# Patient Record
Sex: Female | Born: 1935 | Race: Black or African American | Hispanic: No | Smoking: Never smoker
Health system: Southern US, Community
[De-identification: ages and names within clinical notes are randomized; demographics above are authoritative.]

## PROBLEM LIST (undated history)

## (undated) DIAGNOSIS — I509 Heart failure, unspecified: Secondary | ICD-10-CM

## (undated) DIAGNOSIS — M1990 Unspecified osteoarthritis, unspecified site: Secondary | ICD-10-CM

## (undated) DIAGNOSIS — K5900 Constipation, unspecified: Secondary | ICD-10-CM

## (undated) DIAGNOSIS — F329 Major depressive disorder, single episode, unspecified: Secondary | ICD-10-CM

## (undated) DIAGNOSIS — E119 Type 2 diabetes mellitus without complications: Secondary | ICD-10-CM

## (undated) DIAGNOSIS — K279 Peptic ulcer, site unspecified, unspecified as acute or chronic, without hemorrhage or perforation: Secondary | ICD-10-CM

## (undated) DIAGNOSIS — D3A092 Benign carcinoid tumor of the stomach: Secondary | ICD-10-CM

## (undated) DIAGNOSIS — Z8679 Personal history of other diseases of the circulatory system: Secondary | ICD-10-CM

## (undated) DIAGNOSIS — J8410 Pulmonary fibrosis, unspecified: Secondary | ICD-10-CM

## (undated) DIAGNOSIS — R0989 Other specified symptoms and signs involving the circulatory and respiratory systems: Secondary | ICD-10-CM

## (undated) DIAGNOSIS — I10 Essential (primary) hypertension: Secondary | ICD-10-CM

## (undated) DIAGNOSIS — B029 Zoster without complications: Principal | ICD-10-CM

## (undated) DIAGNOSIS — I4891 Unspecified atrial fibrillation: Secondary | ICD-10-CM

## (undated) DIAGNOSIS — K219 Gastro-esophageal reflux disease without esophagitis: Secondary | ICD-10-CM

## (undated) DIAGNOSIS — J449 Chronic obstructive pulmonary disease, unspecified: Secondary | ICD-10-CM

## (undated) DIAGNOSIS — K5790 Diverticulosis of intestine, part unspecified, without perforation or abscess without bleeding: Secondary | ICD-10-CM

## (undated) DIAGNOSIS — M545 Low back pain: Secondary | ICD-10-CM

## (undated) DIAGNOSIS — E049 Nontoxic goiter, unspecified: Secondary | ICD-10-CM

## (undated) DIAGNOSIS — R0609 Other forms of dyspnea: Secondary | ICD-10-CM

## (undated) DIAGNOSIS — K649 Unspecified hemorrhoids: Secondary | ICD-10-CM

## (undated) HISTORY — DX: Chronic obstructive pulmonary disease, unspecified: J44.9

## (undated) HISTORY — DX: Unspecified atrial fibrillation: I48.91

## (undated) HISTORY — DX: Diverticulosis of intestine, part unspecified, without perforation or abscess without bleeding: K57.90

## (undated) HISTORY — DX: Peptic ulcer, site unspecified, unspecified as acute or chronic, without hemorrhage or perforation: K27.9

## (undated) HISTORY — DX: Other forms of dyspnea: R06.09

## (undated) HISTORY — DX: Benign carcinoid tumor of the stomach: D3A.092

## (undated) HISTORY — DX: Pulmonary fibrosis, unspecified: J84.10

## (undated) HISTORY — DX: Zoster without complications: B02.9

## (undated) HISTORY — DX: Major depressive disorder, single episode, unspecified: F32.9

## (undated) HISTORY — DX: Heart failure, unspecified: I50.9

## (undated) HISTORY — DX: Essential (primary) hypertension: I10

## (undated) HISTORY — DX: Unspecified osteoarthritis, unspecified site: M19.90

## (undated) HISTORY — DX: Constipation, unspecified: K59.00

## (undated) HISTORY — DX: Unspecified hemorrhoids: K64.9

## (undated) HISTORY — DX: Personal history of other diseases of the circulatory system: Z86.79

## (undated) HISTORY — DX: Nontoxic goiter, unspecified: E04.9

## (undated) HISTORY — DX: Low back pain: M54.5

## (undated) HISTORY — DX: Type 2 diabetes mellitus without complications: E11.9

## (undated) HISTORY — DX: Gastro-esophageal reflux disease without esophagitis: K21.9

## (undated) HISTORY — DX: Other specified symptoms and signs involving the circulatory and respiratory systems: R09.89

---

## 1983-10-31 HISTORY — PX: ABDOMINAL HYSTERECTOMY: SHX81

## 1990-09-22 LAB — HM COLONOSCOPY: HM Colonoscopy: NORMAL

## 1991-08-22 HISTORY — PX: OTHER SURGICAL HISTORY: SHX169

## 1998-04-18 ENCOUNTER — Encounter: Admission: RE | Admit: 1998-04-18 | Discharge: 1998-07-17 | Payer: Self-pay | Admitting: Internal Medicine

## 1998-07-01 ENCOUNTER — Emergency Department (HOSPITAL_COMMUNITY): Admission: EM | Admit: 1998-07-01 | Discharge: 1998-07-01 | Payer: Self-pay | Admitting: Emergency Medicine

## 1998-10-29 ENCOUNTER — Encounter: Payer: Self-pay | Admitting: Internal Medicine

## 1998-10-29 ENCOUNTER — Emergency Department (HOSPITAL_COMMUNITY): Admission: EM | Admit: 1998-10-29 | Discharge: 1998-10-29 | Payer: Self-pay | Admitting: Internal Medicine

## 1999-09-30 ENCOUNTER — Other Ambulatory Visit: Admission: RE | Admit: 1999-09-30 | Discharge: 1999-09-30 | Payer: Self-pay | Admitting: *Deleted

## 2000-04-15 ENCOUNTER — Emergency Department (HOSPITAL_COMMUNITY): Admission: EM | Admit: 2000-04-15 | Discharge: 2000-04-15 | Payer: Self-pay | Admitting: *Deleted

## 2001-08-15 ENCOUNTER — Emergency Department (HOSPITAL_COMMUNITY): Admission: EM | Admit: 2001-08-15 | Discharge: 2001-08-15 | Payer: Self-pay | Admitting: Emergency Medicine

## 2001-08-15 ENCOUNTER — Encounter: Payer: Self-pay | Admitting: Emergency Medicine

## 2002-09-16 ENCOUNTER — Encounter: Payer: Self-pay | Admitting: Emergency Medicine

## 2002-09-16 ENCOUNTER — Inpatient Hospital Stay (HOSPITAL_COMMUNITY): Admission: EM | Admit: 2002-09-16 | Discharge: 2002-09-20 | Payer: Self-pay | Admitting: Emergency Medicine

## 2002-09-21 ENCOUNTER — Encounter: Payer: Self-pay | Admitting: Emergency Medicine

## 2002-09-21 ENCOUNTER — Inpatient Hospital Stay (HOSPITAL_COMMUNITY): Admission: EM | Admit: 2002-09-21 | Discharge: 2002-10-20 | Payer: Self-pay | Admitting: Emergency Medicine

## 2002-09-21 ENCOUNTER — Encounter: Payer: Self-pay | Admitting: Pulmonary Disease

## 2002-09-22 ENCOUNTER — Encounter: Payer: Self-pay | Admitting: Pulmonary Disease

## 2002-09-23 ENCOUNTER — Encounter: Payer: Self-pay | Admitting: Internal Medicine

## 2002-09-24 ENCOUNTER — Encounter: Payer: Self-pay | Admitting: Internal Medicine

## 2002-10-01 ENCOUNTER — Encounter: Payer: Self-pay | Admitting: Pulmonary Disease

## 2002-10-02 ENCOUNTER — Encounter: Payer: Self-pay | Admitting: Pulmonary Disease

## 2002-10-03 ENCOUNTER — Encounter: Payer: Self-pay | Admitting: Pulmonary Disease

## 2002-10-09 ENCOUNTER — Encounter: Payer: Self-pay | Admitting: Pulmonary Disease

## 2002-10-14 ENCOUNTER — Encounter: Payer: Self-pay | Admitting: Internal Medicine

## 2003-05-30 ENCOUNTER — Inpatient Hospital Stay (HOSPITAL_COMMUNITY): Admission: EM | Admit: 2003-05-30 | Discharge: 2003-05-31 | Payer: Self-pay | Admitting: Emergency Medicine

## 2003-05-30 ENCOUNTER — Encounter: Payer: Self-pay | Admitting: Internal Medicine

## 2003-05-30 ENCOUNTER — Encounter: Payer: Self-pay | Admitting: Emergency Medicine

## 2004-01-18 ENCOUNTER — Emergency Department (HOSPITAL_COMMUNITY): Admission: EM | Admit: 2004-01-18 | Discharge: 2004-01-18 | Payer: Self-pay | Admitting: Emergency Medicine

## 2004-07-23 ENCOUNTER — Ambulatory Visit: Payer: Self-pay | Admitting: Internal Medicine

## 2004-11-13 ENCOUNTER — Ambulatory Visit: Payer: Self-pay | Admitting: Internal Medicine

## 2005-04-17 ENCOUNTER — Ambulatory Visit: Payer: Self-pay | Admitting: Internal Medicine

## 2005-05-22 ENCOUNTER — Ambulatory Visit: Payer: Self-pay | Admitting: Internal Medicine

## 2005-08-06 ENCOUNTER — Ambulatory Visit: Payer: Self-pay | Admitting: Internal Medicine

## 2006-04-03 ENCOUNTER — Ambulatory Visit: Payer: Self-pay | Admitting: Internal Medicine

## 2006-09-09 ENCOUNTER — Ambulatory Visit: Payer: Self-pay | Admitting: Internal Medicine

## 2007-02-25 ENCOUNTER — Ambulatory Visit: Payer: Self-pay | Admitting: Internal Medicine

## 2007-02-25 LAB — CONVERTED CEMR LAB
ALT: 23 units/L (ref 0–40)
AST: 27 units/L (ref 0–37)
Albumin: 3.8 g/dL (ref 3.5–5.2)
Alkaline Phosphatase: 66 units/L (ref 39–117)
BUN: 19 mg/dL (ref 6–23)
Bilirubin, Direct: 0.1 mg/dL (ref 0.0–0.3)
CO2: 32 meq/L (ref 19–32)
Cholesterol: 178 mg/dL (ref 0–200)
GFR calc Af Amer: 79 mL/min
Hemoglobin: 12.4 g/dL (ref 12.0–15.0)
Hgb A1c MFr Bld: 7.2 % — ABNORMAL HIGH (ref 4.6–6.0)
LDL Cholesterol: 83 mg/dL (ref 0–99)
Monocytes Relative: 2.3 % — ABNORMAL LOW (ref 3.0–11.0)
Neutro Abs: 6.9 10*3/uL (ref 1.4–7.7)
Neutrophils Relative %: 78.9 % — ABNORMAL HIGH (ref 43.0–77.0)
Potassium: 3.8 meq/L (ref 3.5–5.1)
RBC: 4.04 M/uL (ref 3.87–5.11)
RDW: 13.2 % (ref 11.5–14.6)
Sodium: 144 meq/L (ref 135–145)
TSH: 0.28 microintl units/mL — ABNORMAL LOW (ref 0.35–5.50)
Total Bilirubin: 0.7 mg/dL (ref 0.3–1.2)
Total CHOL/HDL Ratio: 2.2
Total Protein: 7.6 g/dL (ref 6.0–8.3)
Triglycerides: 72 mg/dL (ref 0–149)

## 2007-03-10 ENCOUNTER — Ambulatory Visit: Payer: Self-pay | Admitting: Internal Medicine

## 2007-03-19 ENCOUNTER — Ambulatory Visit: Payer: Self-pay

## 2007-03-19 ENCOUNTER — Encounter: Payer: Self-pay | Admitting: Internal Medicine

## 2007-06-02 ENCOUNTER — Ambulatory Visit: Payer: Self-pay | Admitting: Internal Medicine

## 2007-06-02 LAB — CONVERTED CEMR LAB
BUN: 15 mg/dL (ref 6–23)
CO2: 35 meq/L — ABNORMAL HIGH (ref 19–32)
Calcium: 8.9 mg/dL (ref 8.4–10.5)
GFR calc non Af Amer: 66 mL/min
HDL: 73.4 mg/dL (ref >39.0)
Sodium: 146 meq/L — ABNORMAL HIGH (ref 135–145)
VLDL: 28 mg/dL (ref 0–40)

## 2007-06-03 ENCOUNTER — Encounter: Payer: Self-pay | Admitting: Internal Medicine

## 2007-06-03 DIAGNOSIS — E119 Type 2 diabetes mellitus without complications: Secondary | ICD-10-CM

## 2007-06-03 DIAGNOSIS — J449 Chronic obstructive pulmonary disease, unspecified: Secondary | ICD-10-CM

## 2007-06-03 DIAGNOSIS — Z8679 Personal history of other diseases of the circulatory system: Secondary | ICD-10-CM

## 2007-06-03 DIAGNOSIS — M1990 Unspecified osteoarthritis, unspecified site: Secondary | ICD-10-CM

## 2007-06-03 DIAGNOSIS — I10 Essential (primary) hypertension: Secondary | ICD-10-CM | POA: Insufficient documentation

## 2007-06-03 DIAGNOSIS — K219 Gastro-esophageal reflux disease without esophagitis: Secondary | ICD-10-CM

## 2007-06-03 DIAGNOSIS — F329 Major depressive disorder, single episode, unspecified: Secondary | ICD-10-CM

## 2007-06-03 DIAGNOSIS — K279 Peptic ulcer, site unspecified, unspecified as acute or chronic, without hemorrhage or perforation: Secondary | ICD-10-CM

## 2007-06-03 DIAGNOSIS — M545 Low back pain, unspecified: Secondary | ICD-10-CM

## 2007-06-03 DIAGNOSIS — I509 Heart failure, unspecified: Secondary | ICD-10-CM

## 2007-06-03 DIAGNOSIS — F3289 Other specified depressive episodes: Secondary | ICD-10-CM

## 2007-06-03 DIAGNOSIS — I5022 Chronic systolic (congestive) heart failure: Secondary | ICD-10-CM | POA: Insufficient documentation

## 2007-06-03 DIAGNOSIS — J4489 Other specified chronic obstructive pulmonary disease: Secondary | ICD-10-CM

## 2007-06-03 HISTORY — DX: Unspecified osteoarthritis, unspecified site: M19.90

## 2007-06-03 HISTORY — DX: Major depressive disorder, single episode, unspecified: F32.9

## 2007-06-03 HISTORY — DX: Chronic obstructive pulmonary disease, unspecified: J44.9

## 2007-06-03 HISTORY — DX: Gastro-esophageal reflux disease without esophagitis: K21.9

## 2007-06-03 HISTORY — DX: Low back pain, unspecified: M54.50

## 2007-06-03 HISTORY — DX: Heart failure, unspecified: I50.9

## 2007-06-03 HISTORY — DX: Type 2 diabetes mellitus without complications: E11.9

## 2007-06-03 HISTORY — DX: Essential (primary) hypertension: I10

## 2007-06-03 HISTORY — DX: Peptic ulcer, site unspecified, unspecified as acute or chronic, without hemorrhage or perforation: K27.9

## 2007-06-03 HISTORY — DX: Other specified depressive episodes: F32.89

## 2007-06-03 HISTORY — DX: Personal history of other diseases of the circulatory system: Z86.79

## 2007-06-03 HISTORY — DX: Other specified chronic obstructive pulmonary disease: J44.89

## 2007-08-10 DIAGNOSIS — I4891 Unspecified atrial fibrillation: Secondary | ICD-10-CM | POA: Insufficient documentation

## 2007-08-10 DIAGNOSIS — D3A092 Benign carcinoid tumor of the stomach: Secondary | ICD-10-CM

## 2007-08-10 DIAGNOSIS — J8410 Pulmonary fibrosis, unspecified: Secondary | ICD-10-CM

## 2007-08-10 DIAGNOSIS — J309 Allergic rhinitis, unspecified: Secondary | ICD-10-CM | POA: Insufficient documentation

## 2007-08-10 HISTORY — DX: Pulmonary fibrosis, unspecified: J84.10

## 2007-08-10 HISTORY — DX: Unspecified atrial fibrillation: I48.91

## 2007-08-10 HISTORY — DX: Benign carcinoid tumor of the stomach: D3A.092

## 2007-08-12 ENCOUNTER — Encounter: Payer: Self-pay | Admitting: Internal Medicine

## 2007-09-03 ENCOUNTER — Ambulatory Visit: Payer: Self-pay | Admitting: Internal Medicine

## 2007-09-09 LAB — CONVERTED CEMR LAB
BUN: 17 mg/dL (ref 6–23)
Chloride: 100 meq/L (ref 96–112)
Glucose, Bld: 119 mg/dL — ABNORMAL HIGH (ref 70–99)
Hgb A1c MFr Bld: 7 % — ABNORMAL HIGH (ref 4.6–6.0)
LDL Cholesterol: 64 mg/dL (ref 0–99)
Sodium: 147 meq/L — ABNORMAL HIGH (ref 135–145)
Triglycerides: 122 mg/dL (ref 0–149)

## 2007-09-10 ENCOUNTER — Telehealth (INDEPENDENT_AMBULATORY_CARE_PROVIDER_SITE_OTHER): Payer: Self-pay | Admitting: *Deleted

## 2007-11-30 ENCOUNTER — Ambulatory Visit: Payer: Self-pay | Admitting: Internal Medicine

## 2007-11-30 LAB — CONVERTED CEMR LAB
Alkaline Phosphatase: 65 units/L (ref 39–117)
Bilirubin, Direct: 0.1 mg/dL (ref 0.0–0.3)
CO2: 33 meq/L — ABNORMAL HIGH (ref 19–32)
Calcium: 9.2 mg/dL (ref 8.4–10.5)
Chloride: 102 meq/L (ref 96–112)
Cholesterol: 178 mg/dL (ref 0–200)
GFR calc Af Amer: 63 mL/min
GFR calc non Af Amer: 52 mL/min
Glucose, Bld: 143 mg/dL — ABNORMAL HIGH (ref 70–99)
LDL Cholesterol: 79 mg/dL (ref 0–99)
Potassium: 4.2 meq/L (ref 3.5–5.1)
Total Bilirubin: 0.6 mg/dL (ref 0.3–1.2)
Total Protein: 7.3 g/dL (ref 6.0–8.3)
Triglycerides: 71 mg/dL (ref 0–149)

## 2007-12-27 ENCOUNTER — Encounter: Payer: Self-pay | Admitting: Internal Medicine

## 2008-03-17 ENCOUNTER — Ambulatory Visit: Payer: Self-pay | Admitting: Internal Medicine

## 2008-03-17 DIAGNOSIS — M1710 Unilateral primary osteoarthritis, unspecified knee: Secondary | ICD-10-CM

## 2008-04-20 ENCOUNTER — Ambulatory Visit: Payer: Self-pay | Admitting: Internal Medicine

## 2008-04-20 DIAGNOSIS — R1084 Generalized abdominal pain: Secondary | ICD-10-CM

## 2008-04-24 LAB — CONVERTED CEMR LAB
Basophils Absolute: 0 10*3/uL (ref 0.0–0.1)
Bilirubin Urine: NEGATIVE
Calcium: 9.1 mg/dL (ref 8.4–10.5)
Chloride: 99 meq/L (ref 96–112)
Eosinophils Absolute: 0 10*3/uL (ref 0.0–0.7)
Hemoglobin: 14.2 g/dL (ref 12.0–15.0)
Lipase: 23 units/L (ref 11.0–59.0)
MCHC: 33.7 g/dL (ref 30.0–36.0)
Neutro Abs: 7.4 10*3/uL (ref 1.4–7.7)
Neutrophils Relative %: 79.3 % — ABNORMAL HIGH (ref 43.0–77.0)
Nitrite: NEGATIVE
RBC: 4.76 M/uL (ref 3.87–5.11)
Specific Gravity, Urine: 1.02 (ref 1.000–1.03)
TSH: 0.44 microintl units/mL (ref 0.35–5.50)
Urine Glucose: NEGATIVE mg/dL
WBC: 9.2 10*3/uL (ref 4.5–10.5)
pH: 5.5 (ref 5.0–8.0)

## 2008-04-26 ENCOUNTER — Telehealth (INDEPENDENT_AMBULATORY_CARE_PROVIDER_SITE_OTHER): Payer: Self-pay | Admitting: *Deleted

## 2008-06-09 ENCOUNTER — Ambulatory Visit: Payer: Self-pay | Admitting: Internal Medicine

## 2008-06-09 DIAGNOSIS — R0989 Other specified symptoms and signs involving the circulatory and respiratory systems: Secondary | ICD-10-CM

## 2008-06-09 DIAGNOSIS — R0609 Other forms of dyspnea: Secondary | ICD-10-CM

## 2008-06-09 DIAGNOSIS — R609 Edema, unspecified: Secondary | ICD-10-CM

## 2008-06-09 HISTORY — DX: Other forms of dyspnea: R06.09

## 2008-06-09 HISTORY — DX: Other specified symptoms and signs involving the circulatory and respiratory systems: R09.89

## 2008-07-26 ENCOUNTER — Ambulatory Visit: Payer: Self-pay | Admitting: Internal Medicine

## 2008-08-01 ENCOUNTER — Telehealth: Payer: Self-pay | Admitting: Internal Medicine

## 2008-08-28 ENCOUNTER — Telehealth: Payer: Self-pay | Admitting: Internal Medicine

## 2008-10-10 ENCOUNTER — Telehealth (INDEPENDENT_AMBULATORY_CARE_PROVIDER_SITE_OTHER): Payer: Self-pay | Admitting: *Deleted

## 2009-02-02 ENCOUNTER — Ambulatory Visit: Payer: Self-pay | Admitting: Internal Medicine

## 2009-02-02 DIAGNOSIS — R5383 Other fatigue: Secondary | ICD-10-CM

## 2009-02-02 DIAGNOSIS — R5381 Other malaise: Secondary | ICD-10-CM | POA: Insufficient documentation

## 2009-02-03 LAB — CONVERTED CEMR LAB
AST: 27 units/L (ref 0–37)
Alkaline Phosphatase: 62 units/L (ref 39–117)
BUN: 20 mg/dL (ref 6–23)
Calcium: 9.7 mg/dL (ref 8.4–10.5)
Chloride: 104 meq/L (ref 96–112)
Creatinine, Ser: 1.1 mg/dL (ref 0.4–1.2)
Creatinine,U: 75.6 mg/dL
Folate: >20 ng/mL
GFR calc non Af Amer: 62.61 mL/min (ref >60)
Glucose, Bld: 124 mg/dL — ABNORMAL HIGH (ref 70–99)
HDL: 92 mg/dL (ref >39.00)
Hemoglobin: 12.8 g/dL (ref 12.0–15.0)
Lymphocytes Relative: 15 % (ref 12.0–46.0)
Lymphs Abs: 1.6 10*3/uL (ref 0.7–4.0)
MCHC: 33.7 g/dL (ref 30.0–36.0)
Microalb, Ur: 0.8 mg/dL (ref 0.0–1.9)
Monocytes Absolute: 0.2 10*3/uL (ref 0.1–1.0)
Neutro Abs: 8.7 10*3/uL — ABNORMAL HIGH (ref 1.4–7.7)
Neutrophils Relative %: 81.8 % — ABNORMAL HIGH (ref 43.0–77.0)
Platelets: 221 10*3/uL (ref 150.0–400.0)
RBC: 4.28 M/uL (ref 3.87–5.11)
RDW: 13.6 % (ref 11.5–14.6)
Total CHOL/HDL Ratio: 2
Triglycerides: 101 mg/dL (ref 0.0–149.0)
VLDL: 20.2 mg/dL (ref 0.0–40.0)
Vitamin B-12: 299 pg/mL (ref 211–911)
WBC: 10.6 10*3/uL — ABNORMAL HIGH (ref 4.5–10.5)

## 2009-04-17 ENCOUNTER — Telehealth (INDEPENDENT_AMBULATORY_CARE_PROVIDER_SITE_OTHER): Payer: Self-pay | Admitting: *Deleted

## 2009-05-11 ENCOUNTER — Ambulatory Visit: Payer: Self-pay | Admitting: Internal Medicine

## 2009-05-14 ENCOUNTER — Ambulatory Visit: Payer: Self-pay | Admitting: Internal Medicine

## 2009-05-14 LAB — CONVERTED CEMR LAB
ALT: 17 units/L (ref 0–35)
AST: 25 units/L (ref 0–37)
Albumin: 3.8 g/dL (ref 3.5–5.2)
BUN: 19 mg/dL (ref 6–23)
Basophils Absolute: 0 10*3/uL (ref 0.0–0.1)
Chloride: 104 meq/L (ref 96–112)
Creatinine, Ser: 1.1 mg/dL (ref 0.4–1.2)
Eosinophils Absolute: 0.2 10*3/uL (ref 0.0–0.7)
GFR calc non Af Amer: 62.56 mL/min (ref >60)
HCT: 40 % (ref 36.0–46.0)
Hemoglobin: 13.2 g/dL (ref 12.0–15.0)
Lymphs Abs: 2.6 10*3/uL (ref 0.7–4.0)
MCHC: 33 g/dL (ref 30.0–36.0)
Monocytes Relative: 3.7 % (ref 3.0–12.0)
Platelets: 233 10*3/uL (ref 150.0–400.0)
Potassium: 3.2 meq/L — ABNORMAL LOW (ref 3.5–5.1)
Pro B Natriuretic peptide (BNP): 39 pg/mL (ref 0.0–100.0)
Sed Rate: 63 mm/hr — ABNORMAL HIGH (ref 0–22)
Sodium: 148 meq/L — ABNORMAL HIGH (ref 135–145)
Total Bilirubin: 0.8 mg/dL (ref 0.3–1.2)
WBC: 11.2 10*3/uL — ABNORMAL HIGH (ref 4.5–10.5)

## 2009-06-13 ENCOUNTER — Ambulatory Visit: Payer: Self-pay | Admitting: Internal Medicine

## 2009-07-11 ENCOUNTER — Telehealth: Payer: Self-pay | Admitting: Internal Medicine

## 2009-07-11 ENCOUNTER — Ambulatory Visit: Payer: Self-pay | Admitting: Internal Medicine

## 2009-08-08 ENCOUNTER — Ambulatory Visit: Payer: Self-pay | Admitting: Internal Medicine

## 2009-08-08 DIAGNOSIS — K649 Unspecified hemorrhoids: Secondary | ICD-10-CM | POA: Insufficient documentation

## 2009-08-08 HISTORY — DX: Unspecified hemorrhoids: K64.9

## 2009-08-27 ENCOUNTER — Telehealth (INDEPENDENT_AMBULATORY_CARE_PROVIDER_SITE_OTHER): Payer: Self-pay | Admitting: *Deleted

## 2009-09-17 ENCOUNTER — Telehealth: Payer: Self-pay | Admitting: Internal Medicine

## 2009-10-11 ENCOUNTER — Ambulatory Visit: Payer: Self-pay | Admitting: Internal Medicine

## 2009-12-26 ENCOUNTER — Encounter: Payer: Self-pay | Admitting: Internal Medicine

## 2009-12-27 ENCOUNTER — Encounter: Payer: Self-pay | Admitting: Internal Medicine

## 2009-12-27 ENCOUNTER — Telehealth: Payer: Self-pay | Admitting: Internal Medicine

## 2010-02-15 ENCOUNTER — Ambulatory Visit: Payer: Self-pay | Admitting: Internal Medicine

## 2010-02-15 DIAGNOSIS — K5900 Constipation, unspecified: Secondary | ICD-10-CM

## 2010-02-15 HISTORY — DX: Constipation, unspecified: K59.00

## 2010-02-16 LAB — CONVERTED CEMR LAB: Vit D, 25-Hydroxy: 45 ng/mL (ref 30–89)

## 2010-02-19 ENCOUNTER — Telehealth: Payer: Self-pay | Admitting: Internal Medicine

## 2010-02-19 ENCOUNTER — Encounter (INDEPENDENT_AMBULATORY_CARE_PROVIDER_SITE_OTHER): Payer: Self-pay | Admitting: *Deleted

## 2010-02-19 LAB — CONVERTED CEMR LAB
AST: 29 units/L (ref 0–37)
Alkaline Phosphatase: 59 units/L (ref 39–117)
BUN: 20 mg/dL (ref 6–23)
Basophils Absolute: 0 10*3/uL (ref 0.0–0.1)
Basophils Relative: 0.2 % (ref 0.0–3.0)
Chloride: 104 meq/L (ref 96–112)
Cholesterol: 194 mg/dL (ref 0–200)
Creatinine, Ser: 1.1 mg/dL (ref 0.4–1.2)
Eosinophils Absolute: 0.1 10*3/uL (ref 0.0–0.7)
GFR calc non Af Amer: 61.15 mL/min (ref >60)
HCT: 37 % (ref 36.0–46.0)
Hgb A1c MFr Bld: 6.8 % — ABNORMAL HIGH (ref 4.6–6.5)
LDL Cholesterol: 69 mg/dL (ref 0–99)
Lymphs Abs: 2 10*3/uL (ref 0.7–4.0)
MCHC: 33.8 g/dL (ref 30.0–36.0)
Monocytes Absolute: 0.1 10*3/uL (ref 0.1–1.0)
Neutro Abs: 8.5 10*3/uL — ABNORMAL HIGH (ref 1.4–7.7)
Neutrophils Relative %: 79.2 % — ABNORMAL HIGH (ref 43.0–77.0)
Platelets: 231 10*3/uL (ref 150.0–400.0)
Saturation Ratios: 19 % — ABNORMAL LOW (ref 20.0–50.0)
Triglycerides: 166 mg/dL — ABNORMAL HIGH (ref 0.0–149.0)
VLDL: 33.2 mg/dL (ref 0.0–40.0)

## 2010-02-20 ENCOUNTER — Ambulatory Visit: Payer: Self-pay | Admitting: Internal Medicine

## 2010-02-20 LAB — CONVERTED CEMR LAB
Creatinine,U: 69.6 mg/dL
Ketones, ur: NEGATIVE mg/dL
Nitrite: NEGATIVE
Specific Gravity, Urine: 1.015 (ref 1.000–1.030)
Urobilinogen, UA: 0.2 (ref 0.0–1.0)

## 2010-02-26 ENCOUNTER — Ambulatory Visit: Payer: Self-pay | Admitting: Internal Medicine

## 2010-03-28 ENCOUNTER — Ambulatory Visit: Payer: Self-pay | Admitting: Cardiovascular Disease

## 2010-03-28 ENCOUNTER — Inpatient Hospital Stay (HOSPITAL_COMMUNITY): Admission: EM | Admit: 2010-03-28 | Discharge: 2010-03-30 | Payer: Self-pay | Admitting: Emergency Medicine

## 2010-03-29 ENCOUNTER — Encounter (INDEPENDENT_AMBULATORY_CARE_PROVIDER_SITE_OTHER): Payer: Self-pay | Admitting: Internal Medicine

## 2010-04-08 ENCOUNTER — Ambulatory Visit: Payer: Self-pay | Admitting: Internal Medicine

## 2010-04-08 ENCOUNTER — Telehealth: Payer: Self-pay | Admitting: Internal Medicine

## 2010-04-08 DIAGNOSIS — R300 Dysuria: Secondary | ICD-10-CM | POA: Insufficient documentation

## 2010-04-08 DIAGNOSIS — R42 Dizziness and giddiness: Secondary | ICD-10-CM | POA: Insufficient documentation

## 2010-04-08 LAB — CONVERTED CEMR LAB
Leukocytes, UA: NEGATIVE
Nitrite: NEGATIVE
Total Protein, Urine: NEGATIVE mg/dL
Urine Glucose: NEGATIVE mg/dL

## 2010-04-15 ENCOUNTER — Ambulatory Visit: Payer: Self-pay | Admitting: Internal Medicine

## 2010-04-15 ENCOUNTER — Telehealth: Payer: Self-pay | Admitting: Internal Medicine

## 2010-04-15 DIAGNOSIS — N390 Urinary tract infection, site not specified: Secondary | ICD-10-CM | POA: Insufficient documentation

## 2010-04-16 ENCOUNTER — Encounter: Payer: Self-pay | Admitting: Internal Medicine

## 2010-04-19 ENCOUNTER — Ambulatory Visit: Payer: Self-pay | Admitting: Internal Medicine

## 2010-04-22 ENCOUNTER — Encounter: Payer: Self-pay | Admitting: Internal Medicine

## 2010-04-23 ENCOUNTER — Encounter: Payer: Self-pay | Admitting: Internal Medicine

## 2010-05-06 ENCOUNTER — Ambulatory Visit: Payer: Self-pay | Admitting: Internal Medicine

## 2010-05-09 ENCOUNTER — Telehealth: Payer: Self-pay | Admitting: Internal Medicine

## 2010-05-10 ENCOUNTER — Telehealth: Payer: Self-pay | Admitting: Internal Medicine

## 2010-05-28 ENCOUNTER — Encounter: Payer: Self-pay | Admitting: Internal Medicine

## 2010-05-30 ENCOUNTER — Ambulatory Visit: Payer: Self-pay | Admitting: Internal Medicine

## 2010-05-31 ENCOUNTER — Encounter: Payer: Self-pay | Admitting: Internal Medicine

## 2010-08-07 ENCOUNTER — Ambulatory Visit: Payer: Self-pay | Admitting: Internal Medicine

## 2010-08-07 DIAGNOSIS — R269 Unspecified abnormalities of gait and mobility: Secondary | ICD-10-CM | POA: Insufficient documentation

## 2010-08-13 ENCOUNTER — Telehealth: Payer: Self-pay | Admitting: Internal Medicine

## 2010-08-16 ENCOUNTER — Emergency Department (HOSPITAL_COMMUNITY): Admission: EM | Admit: 2010-08-16 | Discharge: 2010-08-16 | Payer: Self-pay | Admitting: Emergency Medicine

## 2010-10-22 NOTE — Miscellaneous (Signed)
Summary: MS Order / [facility]  MS Order / [facility]   Imported By: Lennie Odor 05/31/2010 13:58:06  _____________________________________________________________________  External Attachment:    Type:   Image     Comment:   External Document

## 2010-10-22 NOTE — Medication Information (Signed)
Summary: Diabetic Shoes / [facility]  Diabetic Shoes / [facility]   Imported By: Lennie Odor 01/03/2010 11:58:21  _____________________________________________________________________  External Attachment:    Type:   Image     Comment:   External Document

## 2010-10-22 NOTE — Miscellaneous (Signed)
Summary: Updated encounter/[facility]  Updated encounter/[facility]   Imported By: Lester Knowles 05/08/2010 07:56:58  _____________________________________________________________________  External Attachment:    Type:   Image     Comment:   External Document

## 2010-10-22 NOTE — Assessment & Plan Note (Signed)
Summary: Pulmonary/  summary f/u  ov rec trial off spriva   Copy to:  Dr. Oliver Barre Primary Provider/Referring Provider:  Dr. Oliver Barre  CC:  Followup with PFT's.  Pt states that she feels that her breathing has improved since last ov. She denies any new complaints today.Marland Kitchen  History of Present Illness: 60 yowbf never smoker admitted [facility] 08/2002 with atypical pna vs ILD and treated with steroids ever since then for PF/ chronic respiratory failure assoc with elevated esr ? underlying connective tissue dz.  July 11, 2009 most time on 2.5 lpm with h/o pf acutely worse Sept 22 to point where could only walk 10 ft and started on spiriva and better at time of initial ov.    October 11, 2009 Followup with PFT's.  Pt states that she feels that her breathing has improved since last ov when changed from coreg to bystolic based on perceived response to spiriva.  She denies any new complaints today.  Pt denies any significant sore throat, dysphagia, itching, sneezing,  nasal congestion or excess secretions,  fever, chills, sweats, unintended wt loss, pleuritic or exertional cp, hempoptysis, change in poor activity tolerance  orthopnea pnd or leg swelling. Pt also denies any obvious fluctuation in symptoms with weather or environmental change or other alleviating or aggravating factors.         Current Medications (verified): 1)  Unilet Excelite Ii   Misc (Lancets) .... Use As Directed 2)  Amlodipine Besylate 10 Mg Tabs (Amlodipine Besylate) .... Take 1 Tablet By Mouth Once A Day 3)  Fexofenadine Hcl 180 Mg Tabs (Fexofenadine Hcl) .... Take 1 Tablet By Mouth Once A Day 4)  Furosemide 20 Mg Tabs (Furosemide) .... 3 By Mouth Two Times A Day 5)  Lotrisone 1-0.05 % Crea (Clotrimazole-Betamethasone) .... Apply To Skin Twice A Day 6)  Lovastatin 40 Mg Tabs (Lovastatin) .... Take 1 Tablet By Mouth Once A Day 7)  Metformin Hcl 500 Mg Tb24 (Metformin Hcl) .... 4 By Mouth Once Daily 8)  Klor-Con 20 Meq Pack  (Potassium Chloride) .... 3 By Mouth Once Daily 9)  Omeprazole 20 Mg  Cpdr (Omeprazole) .Marland Kitchen.. 1 By Mouth Once Daily 10)  Prednisone 10 Mg  Tabs (Prednisone) .Marland Kitchen.. 1 By Mouth Once Daily 11)  Ecotrin Low Strength 81 Mg  Tbec (Aspirin) .Marland Kitchen.. 1 By Mouth Qd 12)  Gnp Truetrack Smart System   Strp (Glucose Blood) .... Check Cbg 4 Times A Day and Prn 13)  Hydrocodone-Acetaminophen 5-325 Mg Tabs (Hydrocodone-Acetaminophen) .Marland Kitchen.. 1po Q 6 Hrs As Needed Pain 14)  Tessalon Perles 100 Mg Caps (Benzonatate) .Marland Kitchen.. 1 - 2po Three Times A Day As Needed 15)  Multi Vitamin .Marland Kitchen.. 1 By Mouth Once Daily 16)  Spiriva Handihaler 18 Mcg Caps (Tiotropium Bromide Monohydrate) .Marland Kitchen.. 1 Puff Once Daily 17)  O2 2 Lpm .... 24/7 18)  Bystolic 5 Mg Tabs (Nebivolol Hcl) .... One Daily 19)  Anusol-Hc 25 Mg Supp (Hydrocortisone Acetate) .Marland Kitchen.. 1 Pr Two Times A Day For 10 Days or Until Healed  Allergies (verified): No Known Drug Allergies  Past History:  Past Medical History: GERD Hypertension Osteoarthritis Peptic ulcer disease Transient ischemic attack, hx of Congestive heart failure     - 2d echo 03/19/07 mild diastolic dysfunction, nl LA, RV Diabetes mellitus, type II Low back pain COPD Depression gastric carcinoid  tumor Allergic rhinitis Atrial fibrillation Interstitial lung disease    - Onset 12/03 with definite steroid responsive component    - PFT's October 11, 2009 : VC 67%, no airflow obstruction, DLC0 14%  Vital Signs:  Patient profile:   77 year old female Weight:      233 pounds O2 Sat:      99 % on 2.5 liters pulsed Temp:     98.2 degrees F oral Pulse rate:   82 / minute BP sitting:   130 / 68  (left arm) Cuff size:   large  Vitals Entered By: Vernie Murders (October 11, 2009 11:31 AM)  O2 Flow:  2.5 liters pulsed  Physical Exam  Additional Exam:  amb bf on 02 wt 240 > 233 October 11, 2009  HEENT: nl dentition, turbinates, and orophanx. Nl external ear canals without cough reflex NECK :  without  JVD/Nodes/TM/ nl carotid upstrokes bilaterally LUNGS: no acc muscle use, clear to A and P bilaterally without cough on insp or exp maneuvers CV:  RRR  no s3 or murmur or increase in P2,  1-2 plus pitting bilateral edema   ABD:  soft and nontender with nl excursion in the supine position. No bruits or organomegaly, bowel sounds nl MS:  warm without deformities, calf tenderness, cyanosis or clubbing       Impression & Recommendations:  Problem # 1:  INTERSTITIAL LUNG DISEASE (ICD-515) Purely restrictive pft's but response to spiriva while on non-specific beta blockers.  Probably doesn't need spriva now that off coreg.   Remains 02 dependent/ severe PF though not rapidly progressive on present rx.  Problem # 2:  HYPERTENSION (ICD-401.9)  Her updated medication list for this problem includes:    Amlodipine Besylate 10 Mg Tabs (Amlodipine besylate) .Marland Kitchen... Take 1 tablet by mouth once a day    Furosemide 20 Mg Tabs (Furosemide) .Marland KitchenMarland KitchenMarland KitchenMarland Kitchen 3 by mouth two times a day    Bystolic 5 Mg Tabs (Nebivolol hcl) ..... One daily  Orders: Est. Patient Level III (84696)  Patient Instructions: 1)  Continue bystolic instead of coreg 2)  Try off spiriva to see if it makes any difference to your breathing and if breathing worse ok to resume it 3)  Return to office in 3 months, sooner if needed  Prescriptions: BYSTOLIC 5 MG TABS (NEBIVOLOL HCL) one daily  #30 x 11   Entered and Authorized by:   Nyoka Cowden MD   Signed by:   Nyoka Cowden MD on 10/11/2009   Method used:   Electronically to        [facility] (retail)       [address]       [place], [place]  [id number]       Ph: [phone]       Fax: [phone]   RxID:   [id number]

## 2010-10-22 NOTE — Assessment & Plan Note (Signed)
Summary: 3 mos f/u appt/#/cd   Vital Signs:  Patient profile:   77 year old female Height:      65 inches Weight:      233.38 pounds BMI:     38.98 O2 Sat:      97 % on Room air Temp:     98.8 degrees F oral Pulse rate:   81 / minute BP sitting:   130 / 80  (left arm) Cuff size:   large  Vitals Entered By: Zella Ball Ewing CMA (AAMA) (August 07, 2010 11:14 AM)  O2 Flow:  Room air CC: 3 month followup/RE   Primary Care Provider:  Dr. Oliver Barre  CC:  3 month followup/RE.  History of Present Illness: here to f/u - sitting in wheelchair today, but very difficult to walk now due to pain and breathing;  pain meds help and spiriva definitely helps;  felt some warm today for some reason but no fever or chills, no ST or increased cough and wheezing  stable per pt;  can still walk room to room at home;  no CP  but has signficiant left knee pain , no recent falls  - has walker at home;  getting overall weaker she thinks and may need PT for home to re-start;  Pt denies CP, worsening sob, doe, wheezing, orthopnea, pnd, worsening LE edema, palps, dizziness or syncope  Pt denies new neuro symptoms such as headache, facial or extremity weakness Pt denies polydipsia, polyuria, or low sugar symptoms such as shakiness improved with eating.  Overall good compliance with meds, trying to follow low chol, DM diet, wt stable, little excercise however Overall good compliance with meds, and good tolerability. No fever, wt loss, night sweats, loss of appetite or other constitutional symptoms Denies worsening depressive symptoms, suicidal ideation, or panic.    Problems Prior to Update: 1)  Gait Disturbance  (ICD-781.2) 2)  Uti  (ICD-599.0) 3)  Dizziness  (ICD-780.4) 4)  Dysuria  (ICD-788.1) 5)  Preventive Health Care  (ICD-V70.0) 6)  Constipation, Intermittent  (ICD-564.00) 7)  Fatigue  (ICD-780.79) 8)  Hemorrhoids  (ICD-455.6) 9)  Edema  (ICD-782.3) 10)  Fatigue  (ICD-780.79) 11)  Osteoarthritis, Knee,  Right  (ICD-715.96) 12)  Peripheral Edema  (ICD-782.3) 13)  Dyspnea/shortness of Breath  (ICD-786.09) 14)  Abdominal Pain, Generalized  (ICD-789.07) 15)  Osteoarthritis, Knee, Left  (ICD-715.96) 16)  Hepatotoxicity, Drug-induced, Risk of  (ICD-V58.69) 17)  Interstitial Lung Disease  (ICD-515) 18)  Atrial Fibrillation  (ICD-427.31) 19)  Allergic Rhinitis  (ICD-477.9) 20)  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 Use As Directed 2)  Fexofenadine Hcl 180 Mg Tabs (Fexofenadine Hcl) .... Take 1 Tablet By Mouth Once A Day 3)  Furosemide 20 Mg Tabs (Furosemide) .... 3 By Mouth Two Times A Day 4)  Lotrisone 1-0.05 % Crea (Clotrimazole-Betamethasone) .... Apply To Skin Twice A Day 5)  Lovastatin 40 Mg Tabs (Lovastatin) .... Take 1 Tablet By Mouth Once A Day 6)  Metformin Hcl 500 Mg Tb24 (Metformin Hcl) .... 4 By  Mouth Once Daily 7)  Klor-Con 20 Meq Pack (Potassium Chloride) .... 3 By Mouth Once Daily 8)  Omeprazole 20 Mg  Cpdr (Omeprazole) .Marland Kitchen.. 1 By Mouth Once Daily 9)  Ecotrin Low Strength 81 Mg  Tbec (Aspirin) .Marland Kitchen.. 1 By Mouth Qd 10)  Gnp Truetrack Smart System   Strp (Glucose Blood) .... Check Cbg 4 Times A Day and Prn 11)  Multi Vitamin .Marland Kitchen.. 1 By Mouth Once Daily 12)  O2 2 Lpm .... 24/7 13)  Bystolic 10 Mg Tabs (Nebivolol Hcl) .Marland Kitchen.. 1po Once Daily 14)  Meclizine Hcl 12.5 Mg Tabs (Meclizine Hcl) .Marland Kitchen.. 1po Q 6 Hrs As Needed Dizziness 15)  Nitrofurantoin Macrocrystal 100 Mg Caps (Nitrofurantoin Macrocrystal) .Marland Kitchen.. 1 By Mouth Two Times A Day 16)  Prednisone 10 Mg  Tabs (Prednisone) .Marland Kitchen.. 1po Once Daily 17)  Spiriva Handihaler 18 Mcg Caps (Tiotropium Bromide Monohydrate) .Marland Kitchen.. 1 Puff Once Daily  Current Medications (verified): 1)  Unilet Excelite Ii   Misc (Lancets) .... Use As Directed 2)  Fexofenadine Hcl 180 Mg Tabs (Fexofenadine Hcl) .... Take 1 Tablet By Mouth Once A Day 3)  Furosemide 20 Mg Tabs (Furosemide) .... 3 By Mouth Two Times A Day 4)  Lotrisone 1-0.05 % Crea (Clotrimazole-Betamethasone) .... Apply To Skin Twice A Day 5)  Lovastatin 40 Mg Tabs (Lovastatin) .... Take 1 Tablet By Mouth Once A Day 6)  Metformin Hcl 500 Mg Tb24 (Metformin Hcl) .... 4 By Mouth Once Daily 7)  Klor-Con 20 Meq Pack (Potassium Chloride) .... 3 By Mouth Once Daily 8)  Omeprazole 20 Mg  Cpdr (Omeprazole) .Marland Kitchen.. 1 By Mouth Once Daily 9)  Ecotrin Low Strength 81 Mg  Tbec (Aspirin) .Marland Kitchen.. 1 By Mouth Qd 10)  Gnp Truetrack Smart System   Strp (Glucose Blood) .... Check Cbg 4 Times A Day and Prn 11)  Multi Vitamin .Marland Kitchen.. 1 By Mouth Once Daily 12)  O2 2 Lpm .... 24/7 13)  Bystolic 10 Mg Tabs (Nebivolol Hcl) .Marland Kitchen.. 1po Once Daily 14)  Meclizine Hcl 12.5 Mg Tabs (Meclizine Hcl) .Marland Kitchen.. 1po Q 6 Hrs As Needed Dizziness 15)  Prednisone 10 Mg Tabs (Prednisone) .Marland Kitchen.. 1po Once Daily 16)  Spiriva Handihaler 18 Mcg Caps (Tiotropium Bromide Monohydrate) .Marland Kitchen.. 1 Puff Once Daily  Allergies (verified): 1)  Levaquin  Past History:  Past Medical History: Last updated: 02/15/2010 GERD Hypertension Osteoarthritis Peptic ulcer disease Transient ischemic attack, hx of Congestive heart failure     - 2d echo 03/19/07 mild diastolic dysfunction, nl LA, RV Diabetes mellitus, type II Low back pain COPD      - Dr Ocie Doyne Depression gastric carcinoid  tumor Allergic rhinitis Atrial fibrillation Interstitial lung disease    - Onset 12/03 with definite steroid responsive component    - PFT's October 11, 2009 : VC 67%, no airflow obstruction, DLC0 14%  Past Surgical History: Last updated:  09/03/2007 Denies surgical history  Social History: Last updated: 02/15/2010 Never smoker No ETOH Widowed Children Lives with her daughter Retired Drug use-no  Risk Factors: Smoking Status: quit (09/03/2007)  Review of Systems       all otherwise negative per pt -    Physical Exam  General:  alert and overweight-appearing.   Head:  normocephalic and atraumatic.   Eyes:  vision grossly intact, pupils equal, and pupils round.   Ears:  R ear normal and L ear normal.   Nose:  no external deformity and no nasal discharge.   Mouth:  no gingival abnormalities and pharynx pink  and moist.   Neck:  supple and no masses.   Lungs:  normal respiratory effort, R decreased breath sounds, and L decreased breath sounds.  but no wheezing or rales Heart:  normal rate and irregular rhythm.   Msk:  no acute joint tenderness and no joint swelling.   Extremities:  trace to 1+ edema bilat , no ulcers , no erthema Neurologic:  strength normal in all extremities and gait unsteady     Impression & Recommendations:  Problem # 1:  GAIT DISTURBANCE (ICD-781.2)  ok for home PT - f/u any worsening s/s  Orders: Physical Therapy Referral (PT)  Problem # 2:  DIABETES MELLITUS, TYPE II (ICD-250.00)  Her updated medication list for this problem includes:    Metformin Hcl 500 Mg Tb24 (Metformin hcl) .Marland KitchenMarland KitchenMarland KitchenMarland Kitchen 4 by mouth once daily    Ecotrin Low Strength 81 Mg Tbec (Aspirin) .Marland Kitchen... 1 by mouth qd  Labs Reviewed: Creat: 1.1 (02/15/2010)    Reviewed HgBA1c results: 6.8 (02/15/2010)  7.1 (02/02/2009) stable overall by hx and exam, ok to continue meds/tx as is   Problem # 3:  HYPERTENSION (ICD-401.9)  Her updated medication list for this problem includes:    Furosemide 20 Mg Tabs (Furosemide) .Marland KitchenMarland KitchenMarland KitchenMarland Kitchen 3 by mouth two times a day    Bystolic 10 Mg Tabs (Nebivolol hcl) .Marland Kitchen... 1po once daily  BP today: 130/80 Prior BP: 146/80 (05/06/2010)  Labs Reviewed: K+: 4.7 (02/15/2010) Creat: : 1.1 (02/15/2010)    Chol: 194 (02/15/2010)   HDL: 92.20 (02/15/2010)   LDL: 69 (02/15/2010)   TG: 166.0 (02/15/2010) stable overall by hx and exam, ok to continue meds/tx as is   Problem # 4:  COPD (ICD-496)  Her updated medication list for this problem includes:    Spiriva Handihaler 18 Mcg Caps (Tiotropium bromide monohydrate) .Marland Kitchen... 1 puff once daily stable overall by hx and exam, ok to continue meds/tx as is   Complete Medication List: 1)  Unilet Excelite Ii Misc (Lancets) .... Use as directed 2)  Fexofenadine Hcl 180 Mg Tabs (Fexofenadine hcl) .... Take 1 tablet by mouth once a day 3)  Furosemide 20 Mg Tabs (Furosemide) .... 3 by mouth two times a day 4)  Lotrisone 1-0.05 % Crea (Clotrimazole-betamethasone) .... Apply to skin twice a day 5)  Lovastatin 40 Mg Tabs (Lovastatin) .... Take 1 tablet by mouth once a day 6)  Metformin Hcl 500 Mg Tb24 (Metformin hcl) .... 4 by mouth once daily 7)  Klor-con 20 Meq Pack (Potassium chloride) .... 3 by mouth once daily 8)  Omeprazole 20 Mg Cpdr (Omeprazole) .Marland Kitchen.. 1 by mouth once daily 9)  Ecotrin Low Strength 81 Mg Tbec (Aspirin) .Marland Kitchen.. 1 by mouth qd 10)  Gnp Truetrack Smart System Strp (Glucose blood) .... Check cbg 4 times a day and prn 11)  Multi Vitamin  .Marland Kitchen.. 1 by mouth once daily 12)  O2 2 Lpm  .... 24/7 13)  Bystolic 10 Mg Tabs (Nebivolol hcl) .Marland Kitchen.. 1po once daily 14)  Meclizine Hcl 12.5 Mg Tabs (Meclizine hcl) .Marland Kitchen.. 1po q 6 hrs as needed dizziness 15)  Prednisone 10 Mg Tabs (Prednisone) .Marland Kitchen.. 1po once daily 16)  Spiriva Handihaler 18 Mcg Caps (Tiotropium bromide monohydrate) .Marland Kitchen.. 1 puff once daily  Other Orders: Flu Vaccine 90yrs + MEDICARE PATIENTS (N8295) Administration Flu vaccine - MCR (A2130)  Patient Instructions: 1)  you had the flu shot today 2)  You will be contacted about the referral(s) to: Physical therapy 3)  Continue all previous medications  as before this visit  4)  Please schedule a follow-up appointment in 6 months.   Orders Added: 1)   Flu Vaccine 73yrs + MEDICARE PATIENTS [Q2039] 2)  Administration Flu vaccine - MCR [G0008] 3)  Physical Therapy Referral [PT] 4)  Est. Patient Level IV [52841]   Flu Vaccine Consent Questions     Do you have a history of severe allergic reactions to this vaccine? no    Any prior history of allergic reactions to egg and/or gelatin? no    Do you have a sensitivity to the preservative Thimersol? no    Do you have a past history of Guillan-Barre Syndrome? no    Do you currently have an acute febrile illness? no    Have you ever had a severe reaction to latex? no    Vaccine information given and explained to patient? yes    Are you currently pregnant? no    Lot Number:[id number]   Exp Date:03/22/2011   Site Given  Left Deltoid IM       [email or web address]

## 2010-10-22 NOTE — Miscellaneous (Signed)
Summary: OT Orders / [facility]  OT Orders / [facility]   Imported By: Lennie Odor 05/31/2010 13:54:57  _____________________________________________________________________  External Attachment:    Type:   Image     Comment:   External Document

## 2010-10-22 NOTE — Progress Notes (Signed)
  Phone Note From Other Clinic   [facility]  Summary of Call: [facility] pt is not getting any better with PT she doesn't do all of her exericise , so they decided to d/c her from PT . pt is very sweet and Cooperative but feels that she does not need PT again  so referral  appt was not made  Initial call taken by: Shelbie Proctor,  August 13, 2010 4:59 PM  Follow-up for Phone Call        noted Follow-up by: Corwin Levins MD,  August 13, 2010 5:43 PM

## 2010-10-22 NOTE — Miscellaneous (Signed)
Summary: Plan of Care & Treatment/[facility]  Plan of Care & Treatment/[facility]   Imported By: Sherian Rein 04/22/2010 14:54:20  _____________________________________________________________________  External Attachment:    Type:   Image     Comment:   External Document

## 2010-10-22 NOTE — Miscellaneous (Signed)
Summary: MS Order / [facility]  MS Order / [facility]   Imported By: Lennie Odor 05/31/2010 13:59:40  _____________________________________________________________________  External Attachment:    Type:   Image     Comment:   External Document

## 2010-10-22 NOTE — Progress Notes (Signed)
Summary: Order  Phone Note Other Incoming Call back at [phone]   Caller: [facility] Summary of Call: [facility] called requesting order for Spriva. Pt is having SOB at times. Please advise Initial call taken by: Brenton Grills MA,  May 10, 2010 4:54 PM  Follow-up for Phone Call        done per emr Follow-up by: Corwin Levins MD,  May 10, 2010 5:36 PM  Additional Follow-up for Phone Call Additional follow up Details #1::        Pt informed Additional Follow-up by: Margaret Pyle, CMA,  May 13, 2010 8:16 AM    New/Updated Medications: SPIRIVA HANDIHALER 18 MCG CAPS (TIOTROPIUM BROMIDE MONOHYDRATE) 1 puff once daily Prescriptions: SPIRIVA HANDIHALER 18 MCG CAPS (TIOTROPIUM BROMIDE MONOHYDRATE) 1 puff once daily  #30 x 11   Entered and Authorized by:   Corwin Levins MD   Signed by:   Corwin Levins MD on 05/10/2010   Method used:   Electronically to        [facility] (retail)       [address]       [place], [place]  [id number]       Ph: [phone]       Fax: [phone]   RxID:   [id number]

## 2010-10-22 NOTE — Letter (Signed)
Summary: Referral for PCS/CCME  Referral for PCS/CCME   Imported By: Sherian Rein 04/25/2010 08:02:36  _____________________________________________________________________  External Attachment:    Type:   Image     Comment:   External Document

## 2010-10-22 NOTE — Assessment & Plan Note (Signed)
Summary: PER PT FU  D/T---STC   Vital Signs:  Patient profile:   77 year old female Height:      65 inches Weight:      234 pounds BMI:     39.08 O2 Sat:      98 % on Room air Temp:     98.3 degrees F oral Pulse rate:   86 / minute BP sitting:   114 / 68  (left arm) Cuff size:   large  Vitals Entered ByZella Ball Ewing (Feb 15, 2010 11:22 AM)  O2 Flow:  Room air  Preventive Care Screening  Colonoscopy:    Date:  09/22/1990    Results:  normal per pt   CC: followup, constipation problems/RE   Primary Care Provider:  Dr. Oliver Barre  CC:  followup and constipation problems/RE.  History of Present Illness: overall doing well, Pt denies CP, worsening cough, sob, doe, wheezing, orthopnea, pnd, worsening LE edema, palps, dizziness or syncope  Pt denies new neuro symptoms such as headache, facial or extremity weakness  Pt denies polydipsia, polyuria, or low sugar symptoms such as shakiness improved with eating.  Overall good compliance with meds, trying to follow low chol, DM diet, wt stable, little excercise however ,  has baseline fatigue no change recently, and recent constipation improved with re-starting miralax in the past 2 wks.  Overall good med compliacne, good tolernce.  No longer using the spiriva.   Here for wellness Diet: Heart Healthy or DM if diabetic Physical Activities: Sedentary Depression/mood screen: Negative Hearing: Intact bilateral Visual Acuity: Grossly normal, gets exam yearly and due for exam now ADL's: Capable except for help getting in to the bathtub Fall Risk: mild, walks with home 02 and walker with wheels Home Safety: Good Cognitive Impairment:  Gen appearance, affect, speech, memory, attention & motor skills grossly intact End-of-Life Planning: Advance directive - Full code/I agree   Preventive Screening-Counseling & Management      Drug Use:  no.    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 Use As Directed 2)  Amlodipine Besylate 10 Mg Tabs (Amlodipine Besylate) .... Take 1 Tablet By Mouth Once A Day 3)  Fexofenadine Hcl 180 Mg Tabs (Fexofenadine Hcl) .... Take 1 Tablet By Mouth Once A Day 4)  Furosemide 20 Mg Tabs (Furosemide) .... 3 By Mouth Two Times A Day 5)  Lotrisone 1-0.05 % Crea (Clotrimazole-Betamethasone) .... Apply To Skin Twice A Day 6)  Lovastatin 40 Mg Tabs (Lovastatin) .Marland KitchenMarland KitchenMarland Kitchen  Take 1 Tablet By Mouth Once A Day 7)  Metformin Hcl 500 Mg Tb24 (Metformin Hcl) .... 4 By Mouth Once Daily 8)  Klor-Con 20 Meq Pack (Potassium Chloride) .... 3 By Mouth Once Daily 9)  Omeprazole 20 Mg  Cpdr (Omeprazole) .Marland Kitchen.. 1 By Mouth Once Daily 10)  Prednisone 10 Mg  Tabs (Prednisone) .Marland Kitchen.. 1 By Mouth Once Daily 11)  Ecotrin Low Strength 81 Mg  Tbec (Aspirin) .Marland Kitchen.. 1 By Mouth Qd 12)  Gnp [employer] Smart System   Strp (Glucose Blood) .... Check Cbg 4 Times A Day and Prn 13)  Hydrocodone-Acetaminophen 5-325 Mg Tabs  (Hydrocodone-Acetaminophen) .Marland Kitchen.. 1po Q 6 Hrs As Needed Pain 14)  Tessalon Perles 100 Mg Caps (Benzonatate) .Marland Kitchen.. 1 - 2po Three Times A Day As Needed 15)  Multi Vitamin .Marland Kitchen.. 1 By Mouth Once Daily 16)  Spiriva Handihaler 18 Mcg Caps (Tiotropium Bromide Monohydrate) .Marland Kitchen.. 1 Puff Once Daily 17)  O2 2 Lpm .... 24/7 18)  Bystolic 5 Mg Tabs (Nebivolol Hcl) .... One Daily 19)  Anusol-Hc 25 Mg Supp (Hydrocortisone Acetate) .Marland Kitchen.. 1 Pr Two Times A Day For 10 Days or Until Healed  Current Medications (verified): 1)  Unilet Excelite Ii   Misc (Lancets) .... Use As Directed 2)  Amlodipine Besylate 10 Mg Tabs (Amlodipine Besylate) .... Take 1 Tablet By Mouth Once A Day 3)  Fexofenadine Hcl 180 Mg Tabs (Fexofenadine Hcl) .... Take 1 Tablet By Mouth Once A Day 4)  Furosemide 20 Mg Tabs (Furosemide) .... 3 By Mouth Two Times A Day 5)  Lotrisone 1-0.05 % Crea (Clotrimazole-Betamethasone) .... Apply To Skin Twice A Day 6)  Lovastatin 40 Mg Tabs (Lovastatin) .... Take 1 Tablet By Mouth Once A Day 7)  Metformin Hcl 500 Mg Tb24 (Metformin Hcl) .... 4 By Mouth Once Daily 8)  Klor-Con 20 Meq Pack (Potassium Chloride) .... 3 By Mouth Once Daily 9)  Omeprazole 20 Mg  Cpdr (Omeprazole) .Marland Kitchen.. 1 By Mouth Once Daily 10)  Prednisone 10 Mg  Tabs (Prednisone) .Marland Kitchen.. 1 By Mouth Once Daily 11)  Ecotrin Low Strength 81 Mg  Tbec (Aspirin) .Marland Kitchen.. 1 By Mouth Qd 12)  Gnp [employer] Smart System   Strp (Glucose Blood) .... Check Cbg 4 Times A Day and Prn 13)  Multi Vitamin .Marland Kitchen.. 1 By Mouth Once Daily 14)  O2 2 Lpm .... 24/7 15)  Bystolic 5 Mg Tabs (Nebivolol Hcl) .... One Daily  Allergies (verified): No Known Drug Allergies  Past History:  Past Surgical History: Last updated: 09/03/2007 Denies surgical history  Family History: Last updated: 07/11/2009 Family History Hypertension- Mother and Father Breast CA- Sister Liver CA- Sister Negative for respiratory diseases or atopy   Social History: Last updated: 02/15/2010 Never  smoker No ETOH Widowed Children Lives with her daughter Retired Drug use-no  Risk Factors: Smoking Status: quit (09/03/2007)  Past Medical History: GERD Hypertension Osteoarthritis Peptic ulcer disease Transient ischemic attack, hx of Congestive heart failure     - 2d echo 03/19/07 mild diastolic dysfunction, nl LA, RV Diabetes mellitus, type II Low back pain COPD      - Dr Ocie Doyne Depression gastric carcinoid  tumor Allergic rhinitis Atrial fibrillation Interstitial lung disease    - Onset 12/03 with definite steroid responsive component    - PFT's October 11, 2009 : VC 67%, no airflow obstruction, DLC0 14%  Social History: Reviewed history from 07/11/2009 and no changes required. Never smoker No ETOH Widowed Children Lives with her daughter Retired Drug use-no Drug  Use:  no  Review of Systems  The patient denies anorexia, fever, vision loss, decreased hearing, hoarseness, chest pain, syncope, prolonged cough, headaches, hemoptysis, abdominal pain, melena, hematochezia, severe indigestion/heartburn, hematuria, incontinence, muscle weakness, suspicious skin lesions, transient blindness, depression, unusual weight change, abnormal bleeding, enlarged lymph nodes, angioedema, and breast masses.         all otherwise negative per pt -  has stable LE edema, and no recent falls, no OSA symtpoms such a daytime hypersomnolence  Physical Exam  General:  alert and overweight-appearing.   Head:  normocephalic and atraumatic.   Eyes:  vision grossly intact, pupils equal, and pupils round.   Ears:  R ear normal and L ear normal.   Nose:  no external deformity and no nasal discharge.   Mouth:  no gingival abnormalities and pharynx pink and moist.   Neck:  supple and no masses.   Lungs:  normal respiratory effort, R decreased breath sounds, and L decreased breath sounds. but no wheezing   Heart:  normal rate and regular rhythm.   Abdomen:  soft, non-tender, and normal  bowel sounds.   Msk:  no joint tenderness and no joint swelling.   Extremities:  bilat LE trace to 1+ edema, no ulcers or erythema Neurologic:  cranial nerves II-XII intact and strength normal in all extremities.   Skin:  color normal and no rashes.   Psych:  not anxious appearing and not depressed appearing.     Impression & Recommendations:  Problem # 1:  Preventive Health Care (ICD-V70.0)  Overall doing well, age appropriate education and counseling updated and referral for appropriate preventive services done unless declined, immunizations up to date or declined, diet counseling done if overweight, urged to quit smoking if smokes , most recent labs reviewed and current ordered if appropriate, ecg reviewed or declined (interpretation per ECG scanned in the EMR if done); information regarding Medicare Prevention requirements given if appropriate; speciality referrals updated as appropriate   Orders: First annual wellness visit with prevention plan  (U0454)  Problem # 2:  DIABETES MELLITUS, TYPE II (ICD-250.00)  Her updated medication list for this problem includes:    Metformin Hcl 500 Mg Tb24 (Metformin hcl) .Marland KitchenMarland KitchenMarland KitchenMarland Kitchen 4 by mouth once daily    Ecotrin Low Strength 81 Mg Tbec (Aspirin) .Marland Kitchen... 1 by mouth qd  Orders: TLB-Lipid Panel (80061-LIPID) TLB-A1C / Hgb A1C (Glycohemoglobin) (83036-A1C)  Labs Reviewed: Creat: 1.1 (05/11/2009)    Reviewed HgBA1c results: 7.1 (02/02/2009)  7.1 (04/20/2008) stable overall by hx and exam, ok to continue meds/tx as is . Pt to cont DM diet, excercise, wt loss efforts; to check labs today   Problem # 3:  FATIGUE (ICD-780.79) exam benign except for chronic comorbidities, to check labs below; follow with expectant management  Orders: TLB-BMP (Basic Metabolic Panel-BMET) (80048-METABOL) TLB-CBC Platelet - w/Differential (85025-CBCD) TLB-Hepatic/Liver Function Pnl (80076-HEPATIC) TLB-TSH (Thyroid Stimulating Hormone) (84443-TSH) TLB-Sedimentation Rate  (ESR) (85652-ESR) TLB-IBC Pnl (Iron/FE;Transferrin) (83550-IBC) TLB-B12 + Folate Pnl (82746_82607-B12/FOL) T-Vitamin D (25-Hydroxy) (09811-91478)  Problem # 4:  CONSTIPATION, INTERMITTENT (ICD-564.00)  improved with the miralax - to cont as is;  pt also interested in screening colonoscopy ; will refer to GI prior to screening due to mulcomorbidities  Orders: Gastroenterology Referral (GI)  Problem # 5:  COPD (ICD-496)  The following medications were removed from the medication list:    Spiriva Handihaler 18 Mcg Caps (Tiotropium bromide monohydrate) .Marland Kitchen... 1 puff once daily stable overall by hx and exam, ok to continue meds/tx as is ,  pt requests f/u with dr wert as she has missed the recommended f/u  Orders: Pulmonary Referral (Pulmonary)  Problem # 6:  HYPERTENSION (ICD-401.9)  Her updated medication list for this problem includes:    Amlodipine Besylate 10 Mg Tabs (Amlodipine besylate) .Marland Kitchen... Take 1 tablet by mouth once a day    Furosemide 20 Mg Tabs (Furosemide) .Marland KitchenMarland KitchenMarland KitchenMarland Kitchen 3 by mouth two times a day    Bystolic 5 Mg Tabs (Nebivolol hcl) ..... One daily  BP today: 114/68 Prior BP: 130/68 (10/11/2009)  Labs Reviewed: K+: 3.2 (05/11/2009) Creat: : 1.1 (05/11/2009)   Chol: 190 (02/02/2009)   HDL: 92.00 (02/02/2009)   LDL: 78 (02/02/2009)   TG: 101.0 (02/02/2009) stable overall by hx and exam, ok to continue meds/tx as is   Complete Medication List: 1)  Unilet Excelite Ii Misc (Lancets) .... Use as directed 2)  Amlodipine Besylate 10 Mg Tabs (Amlodipine besylate) .... Take 1 tablet by mouth once a day 3)  Fexofenadine Hcl 180 Mg Tabs (Fexofenadine hcl) .... Take 1 tablet by mouth once a day 4)  Furosemide 20 Mg Tabs (Furosemide) .... 3 by mouth two times a day 5)  Lotrisone 1-0.05 % Crea (Clotrimazole-betamethasone) .... Apply to skin twice a day 6)  Lovastatin 40 Mg Tabs (Lovastatin) .... Take 1 tablet by mouth once a day 7)  Metformin Hcl 500 Mg Tb24 (Metformin hcl) .... 4 by  mouth once daily 8)  Klor-con 20 Meq Pack (Potassium chloride) .... 3 by mouth once daily 9)  Omeprazole 20 Mg Cpdr (Omeprazole) .Marland Kitchen.. 1 by mouth once daily 10)  Prednisone 10 Mg Tabs (Prednisone) .Marland Kitchen.. 1 by mouth once daily 11)  Ecotrin Low Strength 81 Mg Tbec (Aspirin) .Marland Kitchen.. 1 by mouth qd 12)  Gnp [employer] Smart System Strp (Glucose blood) .... Check cbg 4 times a day and prn 13)  Multi Vitamin  .Marland Kitchen.. 1 by mouth once daily 14)  O2 2 Lpm  .... 24/7 15)  Bystolic 5 Mg Tabs (Nebivolol hcl) .... One daily  Patient Instructions: 1)  You will be contacted about the referral(s) to: Dr Alferd Apa, and GI doctor 2)  please call for your followup eye doctor appt , and the mammogram either at [facility] on [address], or [facility] on [address] 3)  Please go to the Lab in the basement for your blood and/or urine tests today  4)  please drop off the FMLA form on tuesday next wk to be filled out 5)  Please schedule a follow-up appointment in 6 months.

## 2010-10-22 NOTE — Letter (Signed)
Summary: CMN for walker/[facility]  CMN for walker/[facility]   Imported By: Sherian Rein 04/18/2010 09:32:12  _____________________________________________________________________  External Attachment:    Type:   Image     Comment:   External Document

## 2010-10-22 NOTE — Letter (Signed)
Summary: [facility] Consult Scheduled Letter  [facility]  [address] [place], [place] [address]   Phone: [phone]  Fax: [phone]      02/19/2010 MRN: [id number]  [facility] [address] [place], [place]  [address]    Dear [name],      We have scheduled an appointment for you.  At the recommendation of Dr.John, we have scheduled you a consult with Dr Jarold Motto on 03/22/10 at 10:00am.  Their phone number is [phone].  If this appointment day and time is not convenient for you, please feel free to call the office of the doctor you are being referred to at the number listed above and reschedule the appointment.     [facility] [address] [place], [place] [address] *Gastroenterology Dept.3rd Floor*    Please give 24hr notice if you need to cancel/reschedule to avoid a $50.00 fee.Also bring insurance card and any co-pay due at time of visit.   Thank you,  Patient Care Coordinator [facility]

## 2010-10-22 NOTE — Assessment & Plan Note (Signed)
Summary: f/u appt/cd   Vital Signs:  Patient profile:   77 year old female Weight:      236 pounds BMI:     39.41 O2 Sat:      97 % on Room air Temp:     99.4 degrees F oral Pulse rate:   78 / minute BP sitting:   128 / 80  (left arm) Cuff size:   large  Vitals Entered By: Zella Ball Ewing CMA Duncan Dull) (April 15, 2010 4:26 PM)  O2 Flow:  Room air CC: followup/RE   Primary Care Provider:  Dr. Oliver Barre  CC:  followup/RE.  History of Present Illness: here to f/u ;  has taken the lasix in the AM only,and held the amloidpine;  feels overall much improved with respect to stamina, and dizziness virtually resolved,  did start the levaquin and but changed to nitrofurantoin  when curine ulture results known and overall feels improved with GU symptoms;  no headache, ST, worsening cough, dysuria, freq and urgency, back pain, n/v and Pt denies CP, wheezing, orthopnea, pnd, worsening LE edema, palps, dizziness or syncope, but has mild ? worsening sob and  orthopnea, but no pnd.  No fever, wt loss, night sweats, loss of appetite or other constitutional symptoms .  LE edema seems overall impo0rved to her as the swelling seems to go down at night when it did not so much before, but overall wt no change  (? due to amlodipine contributing to the edema).    Problems Prior to Update: 1)  Dizziness  (ICD-780.4) 2)  Dysuria  (ICD-788.1) 3)  Preventive Health Care  (ICD-V70.0) 4)  Constipation, Intermittent  (ICD-564.00) 5)  Fatigue  (ICD-780.79) 6)  Hemorrhoids  (ICD-455.6) 7)  Edema  (ICD-782.3) 8)  Fatigue  (ICD-780.79) 9)  Osteoarthritis, Knee, Right  (ICD-715.96) 10)  Peripheral Edema  (ICD-782.3) 11)  Dyspnea/shortness of Breath  (ICD-786.09) 12)  Abdominal Pain, Generalized  (ICD-789.07) 13)  Osteoarthritis, Knee, Left  (ICD-715.96) 14)  Hepatotoxicity, Drug-induced, Risk of  (ICD-V58.69) 15)  Interstitial Lung Disease  (ICD-515) 16)  Atrial Fibrillation  (ICD-427.31) 17)  Allergic Rhinitis   (ICD-477.9) 18)  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 Use As Directed 2)  Amlodipine Besylate 10 Mg Tabs (Amlodipine Besylate) .... Take 1 Tablet By Mouth Once A Day 3)  Fexofenadine Hcl 180 Mg Tabs (Fexofenadine Hcl) .... Take 1 Tablet By Mouth Once A Day 4)  Furosemide 20 Mg Tabs (Furosemide) .... 3 By Mouth Two Times A Day 5)  Lotrisone 1-0.05 % Crea (Clotrimazole-Betamethasone) .... Apply To Skin Twice A Day 6)  Lovastatin 40 Mg Tabs (Lovastatin) .... Take 1 Tablet By Mouth Once A Day 7)  Metformin Hcl 500 Mg Tb24 (Metformin Hcl) .... 4 By Mouth Once Daily 8)  Klor-Con 20 Meq Pack (Potassium Chloride) .... 3 By Mouth Once Daily 9)  Omeprazole 20 Mg  Cpdr (Omeprazole) .Marland Kitchen.. 1 By Mouth Once Daily 10)  Ecotrin Low Strength 81 Mg  Tbec (Aspirin) .Marland Kitchen.. 1 By Mouth Qd 11)  Gnp [employer] System   Strp (Glucose Blood) .... Check Cbg 4 Times A Day and Prn 12)  Multi Vitamin .Marland Kitchen.. 1 By Mouth Once Daily 13)  O2 2 Lpm .... 24/7 14)  Bystolic 5 Mg Tabs (Nebivolol Hcl) .... One Daily 15)  Meclizine Hcl 12.5 Mg Tabs (Meclizine Hcl) .Marland Kitchen.. 1po Q 6 Hrs As Needed Dizziness 16)  Nitrofurantoin Macrocrystal 100 Mg Caps (Nitrofurantoin Macrocrystal) .Marland Kitchen.. 1 By Mouth Two Times A Day  Current Medications (verified): 1)  Unilet Excelite Ii   Misc (Lancets) .... Use As Directed 2)  Fexofenadine Hcl 180 Mg Tabs (Fexofenadine Hcl) .... Take 1 Tablet By Mouth Once A Day 3)  Furosemide 20 Mg Tabs (Furosemide) .... 3 By Mouth Two Times A Day 4)  Lotrisone 1-0.05 % Crea  (Clotrimazole-Betamethasone) .... Apply To Skin Twice A Day 5)  Lovastatin 40 Mg Tabs (Lovastatin) .... Take 1 Tablet By Mouth Once A Day 6)  Metformin Hcl 500 Mg Tb24 (Metformin Hcl) .... 4 By Mouth Once Daily 7)  Klor-Con 20 Meq Pack (Potassium Chloride) .... 3 By Mouth Once Daily 8)  Omeprazole 20 Mg  Cpdr (Omeprazole) .Marland Kitchen.. 1 By Mouth Once Daily 9)  Ecotrin Low Strength 81 Mg  Tbec (Aspirin) .Marland Kitchen.. 1 By Mouth Qd 10)  Gnp [employer] Smart System   Strp (Glucose Blood) .... Check Cbg 4 Times A Day and Prn 11)  Multi Vitamin .Marland Kitchen.. 1 By Mouth Once Daily 12)  O2 2 Lpm .... 24/7 13)  Bystolic 5 Mg Tabs (Nebivolol Hcl) .... One Daily 14)  Meclizine Hcl 12.5 Mg Tabs (Meclizine Hcl) .Marland Kitchen.. 1po Q 6 Hrs As Needed Dizziness 15)  Nitrofurantoin Macrocrystal 100 Mg Caps (Nitrofurantoin Macrocrystal) .Marland Kitchen.. 1 By Mouth Two Times A Day  Allergies (verified): 1)  Levaquin  Past History:  Past Medical History: Last updated: 02/15/2010 GERD Hypertension Osteoarthritis Peptic ulcer disease Transient ischemic attack, hx of Congestive heart failure     - 2d echo 03/19/07 mild diastolic dysfunction, nl LA, RV Diabetes mellitus, type II Low back pain COPD      - Dr Ocie Doyne Depression gastric carcinoid  tumor Allergic rhinitis Atrial fibrillation Interstitial lung disease    - Onset 12/03 with definite steroid responsive component    - PFT's October 11, 2009 : VC 67%, no airflow obstruction, DLC0 14%  Past Surgical History: Last updated: 09/03/2007 Denies surgical history  Social History: Last updated: 02/15/2010 Never smoker No ETOH Widowed Children Lives with her daughter Retired Drug use-no  Risk Factors: Smoking Status: quit (09/03/2007)  Review of Systems       .all otherwise negative per pt -    Physical Exam  General:  alert and overweight-appearing.   Head:  normocephalic and atraumatic.   Eyes:  vision grossly intact, pupils equal, and pupils round.   Ears:  R ear normal  and L ear normal.   Nose:  no external deformity and no nasal discharge.   Mouth:  no gingival abnormalities and pharynx pink and moist.   Neck:  supple and no masses.   Lungs:  normal respiratory effort with decresed bilat BS and bibas rales.   Heart:  normal rate and regular rhythm.   Abdomen:  soft, non-tender, and normal bowel sounds.   Msk:  no flank tender Extremities:  bilat LE edema 1+ bilat (somewhat improved overall)   Impression & Recommendations:  Problem # 1:  UTI (ICD-599.0)  Her updated medication list for this problem includes:    Nitrofurantoin  Macrocrystal 100 Mg Caps (Nitrofurantoin macrocrystal) .Marland Kitchen... 1 by mouth two times a day improved, Continue all previous medications as before this visit   Problem # 2:  DIZZINESS (ICD-780.4)  Her updated medication list for this problem includes:    Fexofenadine Hcl 180 Mg Tabs (Fexofenadine hcl) .Marland Kitchen... Take 1 tablet by mouth once a day    Meclizine Hcl 12.5 Mg Tabs (Meclizine hcl) .Marland Kitchen... 1po q 6 hrs as needed dizziness improved it seems with tx for UTI and short term holding of meds as per HPI ;  Continue all previous medications as before this visit   Problem # 3:  PERIPHERAL EDEMA (ICD-782.3)  Her updated medication list for this problem includes:    Furosemide 20 Mg Tabs (Furosemide) .Marland KitchenMarland KitchenMarland KitchenMarland Kitchen 3 by mouth two times a day ok to increase the lasix back to prior long term tx ;  I suspect now the dizziness may have been in part related to the UTI;  has bibas rales and known interstit lung dz and no wt change; edema may have improved more from stopping the amlodipine  Problem # 4:  HYPERTENSION (ICD-401.9)  The following medications were removed from the medication list:    Amlodipine Besylate 10 Mg Tabs (Amlodipine besylate) .Marland Kitchen... Take 1 tablet by mouth once a day Her updated medication list for this problem includes:    Furosemide 20 Mg Tabs (Furosemide) .Marland KitchenMarland KitchenMarland KitchenMarland Kitchen 3 by mouth two times a day    Bystolic 5 Mg Tabs (Nebivolol hcl)  ..... One daily to cont to hold the amlodipine and folloow BP; consider incr bystolic in the future for better BP control,  pt to bring home cuff next visit  Complete Medication List: 1)  Unilet Excelite Ii Misc (Lancets) .... Use as directed 2)  Fexofenadine Hcl 180 Mg Tabs (Fexofenadine hcl) .... Take 1 tablet by mouth once a day 3)  Furosemide 20 Mg Tabs (Furosemide) .... 3 by mouth two times a day 4)  Lotrisone 1-0.05 % Crea (Clotrimazole-betamethasone) .... Apply to skin twice a day 5)  Lovastatin 40 Mg Tabs (Lovastatin) .... Take 1 tablet by mouth once a day 6)  Metformin Hcl 500 Mg Tb24 (Metformin hcl) .... 4 by mouth once daily 7)  Klor-con 20 Meq Pack (Potassium chloride) .... 3 by mouth once daily 8)  Omeprazole 20 Mg Cpdr (Omeprazole) .Marland Kitchen.. 1 by mouth once daily 9)  Ecotrin Low Strength 81 Mg Tbec (Aspirin) .Marland Kitchen.. 1 by mouth qd 10)  Gnp [employer] Smart System Strp (Glucose blood) .... Check cbg 4 times a day and prn 11)  Multi Vitamin  .Marland Kitchen.. 1 by mouth once daily 12)  O2 2 Lpm  .... 24/7 13)  Bystolic 5 Mg Tabs (Nebivolol hcl) .... One daily 14)  Meclizine Hcl 12.5 Mg Tabs (Meclizine hcl) .Marland Kitchen.. 1po q 6 hrs as needed dizziness 15)  Nitrofurantoin Macrocrystal 100 Mg Caps (Nitrofurantoin macrocrystal) .Marland Kitchen.. 1 by mouth two times a day  Patient Instructions: 1)  re-start the fluid pill at 3 pills twice per day as before  2)  please continu to hold the amlodipine blood pressure medicine 3)  Continue all previous medications as before this visit , including finishing the antibiotic 4)  Please schedule a follow-up appointment in 3 weeks.

## 2010-10-22 NOTE — Progress Notes (Signed)
Summary: RX  request  Phone Note Call from Patient Call back at Home Phone [phone]   Caller: Patient Summary of Call: pt called requesting Rx for Diabetic shoes to [facility]. Initial call taken by: Margaret Pyle, CMA,  December 27, 2009 11:15 AM  Follow-up for Phone Call        this normally comes as paperwork sent to [place] to sign Follow-up by: Corwin Levins MD,  December 27, 2009 1:02 PM  Additional Follow-up for Phone Call Additional follow up Details #1::        paperwork received from [facility] pharmacy. Placed on MD's desk for review Additional Follow-up by: Margaret Pyle, CMA,  December 27, 2009 4:31 PM    Additional Follow-up for Phone Call Additional follow up Details #2::    done hardcopy to [facility] side B - dahlia  Follow-up by: Corwin Levins MD,  December 27, 2009 5:48 PM  Additional Follow-up for Phone Call Additional follow up Details #3:: Details for Additional Follow-up Action Taken: Rx faxed to pharmacy Additional Follow-up by: Margaret Pyle, CMA,  December 28, 2009 7:55 AM

## 2010-10-22 NOTE — Miscellaneous (Signed)
Summary: Plan/[facility]  Plan/[facility]   Imported By: Lester [place] 06/03/2010 12:26:26  _____________________________________________________________________  External Attachment:    Type:   Image     Comment:   External Document

## 2010-10-22 NOTE — Miscellaneous (Signed)
Summary: Order / [facility]  Order / [facility]   Imported By: Lennie Odor 04/25/2010 11:15:43  _____________________________________________________________________  External Attachment:    Type:   Image     Comment:   External Document

## 2010-10-22 NOTE — Miscellaneous (Signed)
Summary: Orders Update pft charges  Clinical Lists Changes  Orders: Added new Service order of Carbon Monoxide diffusing w/capacity (94720) - Signed Added new Service order of Lung Volumes (94240) - Signed Added new Service order of Spirometry (Pre & Post) (94060) - Signed 

## 2010-10-22 NOTE — Progress Notes (Signed)
Summary: Verbal orders  Phone Note Other Incoming   [phone] Summary of Call: H[facility]N called requesting verbal order for pt to have HH aid to help with bathing and PT and OT for balance issues.  Initial call taken by: Margaret Pyle, CMA,  May 09, 2010 11:59 AM  Follow-up for Phone Call        ok for verbal Follow-up by: Corwin Levins MD,  May 09, 2010 1:46 PM  Additional Follow-up for Phone Call Additional follow up Details #1::        Verbal given to [facility] Additional Follow-up by: Margaret Pyle, CMA,  May 09, 2010 2:03 PM

## 2010-10-22 NOTE — Progress Notes (Signed)
Summary: Wt loss/weakness  Phone Note From Other Clinic   Caller: [name] [phone]) Summary of Call: Physical therapist called. [name] lost 13 lbs overnight. C/o fatigue & weakness. Also says BP is low but did not give reading. He is req that we call [name] for f/u.  Initial call taken by: Lamar Sprinkles, CMA,  April 08, 2010 11:50 AM  Follow-up for Phone Call        Spoke w/[name], she c/o weakness and dizzyness. Scheduled for f/u office visit today w/advisement to go to ER w/new symptoms or if becomes worse. She will attempt to get transportation to office.  Follow-up by: Lamar Sprinkles, CMA,  April 08, 2010 11:55 AM  Additional Follow-up for Phone Call Additional follow up Details #1::        noted Additional Follow-up by: Corwin Levins MD,  April 08, 2010 12:36 PM

## 2010-10-22 NOTE — Miscellaneous (Signed)
Summary: Face to Face Encounter/[facility]  Face to Face Encounter/[facility]   Imported By: Sherian Rein 04/25/2010 08:03:56  _____________________________________________________________________  External Attachment:    Type:   Image     Comment:   External Document

## 2010-10-22 NOTE — Progress Notes (Signed)
Summary: lab  Phone Note Other Incoming   Caller: lab Summary of Call: The lab called and said that the micro albumin would need to be removed from EMR. Initial call taken by: Scharlene Gloss,  Feb 19, 2010 4:11 PM  Follow-up for Phone Call        ok Follow-up by: Corwin Levins MD,  Feb 19, 2010 5:10 PM

## 2010-10-22 NOTE — Letter (Signed)
Summary: CMN for Diabetic Shoes & Inserts/[facility]  CMN for Diabetic Shoes & Inserts/[facility]   Imported By: Sherian Rein 12/31/2009 14:08:50  _____________________________________________________________________  External Attachment:    Type:   Image     Comment:   External Document

## 2010-10-22 NOTE — Assessment & Plan Note (Signed)
Summary: FU [name]   Vital Signs:  Patient profile:   77 year old female Weight:      230.25 pounds BMI:     38.45 O2 Sat:      97 % on Room air Temp:     98.6 degrees F oral Pulse rate:   80 / minute BP sitting:   146 / 80  (left arm) Cuff size:   large  Vitals Entered By: Zella Ball Ewing CMA Duncan Dull) (May 06, 2010 4:20 PM)  O2 Flow:  Room air CC: Followup/RE   Primary Care Provider:  Dr. Oliver Barre  CC:  Followup/RE.  History of Present Illness: here to f/u;  back on incresed lasix dosing with several lbs fluid wt loss and reduce LE edema;  has nocturia a few times per night  but she does not mind this if the sweling is better controlled; dizziness has not recurred on the increased lasix, and no further GU symptoms such as dysuria, freq, urgency or hemauturia, except for incr volume with taking the lasix.  Has some mild soreness of the hand and legs but minor and not assoc with trauma, swelilng, fever or erythema or injury.  Pt denies CP, worsening sob, doe, wheezing, orthopnea, pnd, worsening LE edema, palps, dizziness or syncope Pt denies new neuro symptoms such as headache, facial or extremity weakness   No fever, wt loss, night sweats, loss of appetite or other constitutional symptoms   Problems Prior to Update: 1)  Uti  (ICD-599.0) 2)  Dizziness  (ICD-780.4) 3)  Dysuria  (ICD-788.1) 4)  Preventive Health Care  (ICD-V70.0) 5)  Constipation, Intermittent  (ICD-564.00) 6)  Fatigue  (ICD-780.79) 7)  Hemorrhoids  (ICD-455.6) 8)  Edema  (ICD-782.3) 9)  Fatigue  (ICD-780.79) 10)  Osteoarthritis, Knee, Right  (ICD-715.96) 11)  Peripheral Edema  (ICD-782.3) 12)  Dyspnea/shortness of Breath  (ICD-786.09) 13)  Abdominal Pain, Generalized  (ICD-789.07) 14)  Osteoarthritis, Knee, Left  (ICD-715.96) 15)  Hepatotoxicity, Drug-induced, Risk of  (ICD-V58.69) 16)  Interstitial Lung Disease  (ICD-515) 17)  Atrial Fibrillation  (ICD-427.31) 18)  Allergic Rhinitis  (ICD-477.9) 19)  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 Use As Directed 2)  Fexofenadine Hcl 180 Mg Tabs (Fexofenadine Hcl) .... Take 1 Tablet By Mouth Once A Day 3)  Furosemide 20 Mg Tabs (Furosemide) .... 3 By Mouth Two Times A Day 4)  Lotrisone 1-0.05 % Crea (Clotrimazole-Betamethasone) .... Apply To Skin Twice A Day 5)  Lovastatin 40 Mg Tabs (Lovastatin) .... Take 1 Tablet By Mouth Once A Day 6)  Metformin Hcl 500 Mg Tb24 (Metformin Hcl) .... 4 By Mouth Once Daily 7)  Klor-Con 20 Meq Pack (Potassium Chloride) .... 3 By Mouth Once Daily 8)  Omeprazole 20 Mg  Cpdr (Omeprazole) .Marland Kitchen.. 1 By Mouth Once Daily 9)  Ecotrin Low Strength 81 Mg  Tbec (Aspirin) .Marland Kitchen.. 1 By Mouth Qd 10)  Gnp Truetrack Smart System   Strp (Glucose Blood) .... Check Cbg 4 Times A Day and Prn 11)  Multi Vitamin .Marland KitchenMarland KitchenMarland Kitchen  1 By Mouth Once Daily 12)  O2 2 Lpm .... 24/7 13)  Bystolic 5 Mg Tabs (Nebivolol Hcl) .... One Daily 14)  Meclizine Hcl 12.5 Mg Tabs (Meclizine Hcl) .Marland Kitchen.. 1po Q 6 Hrs As Needed Dizziness 15)  Nitrofurantoin Macrocrystal 100 Mg Caps (Nitrofurantoin Macrocrystal) .Marland Kitchen.. 1 By Mouth Two Times A Day  Current Medications (verified): 1)  Unilet Excelite Ii   Misc (Lancets) .... Use As Directed 2)  Fexofenadine Hcl 180 Mg Tabs (Fexofenadine Hcl) .... Take 1 Tablet By Mouth Once A Day 3)  Furosemide 20 Mg Tabs (Furosemide) .... 3 By Mouth Two Times A Day 4)  Lotrisone 1-0.05 % Crea (Clotrimazole-Betamethasone) .... Apply To Skin Twice A Day 5)  Lovastatin 40 Mg Tabs (Lovastatin) .... Take 1 Tablet By Mouth  Once A Day 6)  Metformin Hcl 500 Mg Tb24 (Metformin Hcl) .... 4 By Mouth Once Daily 7)  Klor-Con 20 Meq Pack (Potassium Chloride) .... 3 By Mouth Once Daily 8)  Omeprazole 20 Mg  Cpdr (Omeprazole) .Marland Kitchen.. 1 By Mouth Once Daily 9)  Ecotrin Low Strength 81 Mg  Tbec (Aspirin) .Marland Kitchen.. 1 By Mouth Qd 10)  Gnp Truetrack Smart System   Strp (Glucose Blood) .... Check Cbg 4 Times A Day and Prn 11)  Multi Vitamin .Marland Kitchen.. 1 By Mouth Once Daily 12)  O2 2 Lpm .... 24/7 13)  Bystolic 10 Mg Tabs (Nebivolol Hcl) .Marland Kitchen.. 1po Once Daily 14)  Meclizine Hcl 12.5 Mg Tabs (Meclizine Hcl) .Marland Kitchen.. 1po Q 6 Hrs As Needed Dizziness 15)  Nitrofurantoin Macrocrystal 100 Mg Caps (Nitrofurantoin Macrocrystal) .Marland Kitchen.. 1 By Mouth Two Times A Day 16)  Prednisone 10 Mg Tabs (Prednisone) .Marland Kitchen.. 1po Once Daily  Allergies (verified): 1)  Levaquin  Past History:  Past Medical History: Last updated: 02/15/2010 GERD Hypertension Osteoarthritis Peptic ulcer disease Transient ischemic attack, hx of Congestive heart failure     - 2d echo 03/19/07 mild diastolic dysfunction, nl LA, RV Diabetes mellitus, type II Low back pain COPD      - Dr Ocie Doyne Depression gastric carcinoid  tumor Allergic rhinitis Atrial fibrillation Interstitial lung disease    - Onset 12/03 with definite steroid responsive component    - PFT's October 11, 2009 : VC 67%, no airflow obstruction, DLC0 14%  Past Surgical History: Last updated: 09/03/2007 Denies surgical history  Social History: Last updated: 02/15/2010 Never smoker No ETOH Widowed Children Lives with her daughter Retired Drug use-no  Risk Factors: Smoking Status: quit (09/03/2007)  Review of Systems       all otherwise negative per pt -    Physical Exam  General:  alert and overweight-appearing.   Head:  normocephalic and atraumatic.   Eyes:  vision grossly intact, pupils equal, and pupils round.   Ears:  R ear normal and L ear normal.   Nose:  no external deformity and no nasal  discharge.   Mouth:  no gingival abnormalities and pharynx pink and moist.   Neck:  supple and no masses.   Lungs:  normal respiratory effort, R decreased breath sounds, and L decreased breath sounds.  but no wheezing or rales Heart:  normal rate and irregular rhythm.   Extremities:  trace to 1+ edema bilat , no ulcers , no erthema   Impression & Recommendations:  Problem # 1:  CONGESTIVE HEART FAILURE (ICD-428.0)  Her updated medication list for this problem includes:    Furosemide 20 Mg Tabs (Furosemide) .Marland KitchenMarland KitchenMarland KitchenMarland Kitchen 3 by mouth two times a day  Ecotrin Low Strength 81 Mg Tbec (Aspirin) .Marland Kitchen... 1 by mouth qd    Bystolic 10 Mg Tabs (Nebivolol hcl) .Marland Kitchen... 1po once daily  Orders: Home Health Referral (Home Health) stable to improved overall by hx and exam, ok to continue meds/tx as is   Problem # 2:  DIZZINESS (ICD-780.4)  Her updated medication list for this problem includes:    Fexofenadine Hcl 180 Mg Tabs (Fexofenadine hcl) .Marland Kitchen... Take 1 tablet by mouth once a day    Meclizine Hcl 12.5 Mg Tabs (Meclizine hcl) .Marland Kitchen... 1po q 6 hrs as needed dizziness resolved , stable on the incr lasix as above  Problem # 3:  HYPERTENSION (ICD-401.9) Assessment: Unchanged  Her updated medication list for this problem includes:    Furosemide 20 Mg Tabs (Furosemide) .Marland KitchenMarland KitchenMarland KitchenMarland Kitchen 3 by mouth two times a day    Bystolic 10 Mg Tabs (Nebivolol hcl) .Marland Kitchen... 1po once daily  BP today: 146/80 Prior BP: 128/80 (04/15/2010)  Labs Reviewed: K+: 4.7 (02/15/2010) Creat: : 1.1 (02/15/2010)   Chol: 194 (02/15/2010)   HDL: 92.20 (02/15/2010)   LDL: 69 (02/15/2010)   TG: 166.0 (02/15/2010) to increase the bystolic to 10 mg as above, f/u BP at home and next visit  Problem # 4:  ATRIAL FIBRILLATION (ICD-427.31)  Her updated medication list for this problem includes:    Ecotrin Low Strength 81 Mg Tbec (Aspirin) .Marland Kitchen... 1 by mouth qd    Bystolic 10 Mg Tabs (Nebivolol hcl) .Marland Kitchen... 1po once daily stable overall by hx and exam, ok to  continue meds/tx as is , rate ok , could be better with incr bystolic as above  Complete Medication List: 1)  Unilet Excelite Ii Misc (Lancets) .... Use as directed 2)  Fexofenadine Hcl 180 Mg Tabs (Fexofenadine hcl) .... Take 1 tablet by mouth once a day 3)  Furosemide 20 Mg Tabs (Furosemide) .... 3 by mouth two times a day 4)  Lotrisone 1-0.05 % Crea (Clotrimazole-betamethasone) .... Apply to skin twice a day 5)  Lovastatin 40 Mg Tabs (Lovastatin) .... Take 1 tablet by mouth once a day 6)  Metformin Hcl 500 Mg Tb24 (Metformin hcl) .... 4 by mouth once daily 7)  Klor-con 20 Meq Pack (Potassium chloride) .... 3 by mouth once daily 8)  Omeprazole 20 Mg Cpdr (Omeprazole) .Marland Kitchen.. 1 by mouth once daily 9)  Ecotrin Low Strength 81 Mg Tbec (Aspirin) .Marland Kitchen.. 1 by mouth qd 10)  Gnp Truetrack Smart System Strp (Glucose blood) .... Check cbg 4 times a day and prn 11)  Multi Vitamin  .Marland Kitchen.. 1 by mouth once daily 12)  O2 2 Lpm  .... 24/7 13)  Bystolic 10 Mg Tabs (Nebivolol hcl) .Marland Kitchen.. 1po once daily 14)  Meclizine Hcl 12.5 Mg Tabs (Meclizine hcl) .Marland Kitchen.. 1po q 6 hrs as needed dizziness 15)  Nitrofurantoin Macrocrystal 100 Mg Caps (Nitrofurantoin macrocrystal) .Marland Kitchen.. 1 by mouth two times a day 16)  Prednisone 10 Mg Tabs (Prednisone) .Marland Kitchen.. 1po once daily  Patient Instructions: 1)  increase the bystolic to 10 mg per day 2)  you are given the samples and prescription today 3)  Continue all previous medications as before this visit  4)  You will be contacted about the referral(s) to: Home health 5)  Please schedule a follow-up appointment in 3 months. Prescriptions: LOTRISONE 1-0.05 % CREA (CLOTRIMAZOLE-BETAMETHASONE) Apply to skin twice a day  #1 x 1   Entered and Authorized by:   Corwin Levins MD   Signed by:   Len Blalock  Kayden Hutmacher MD on 05/06/2010   Method used:   Electronically to        [facility]* (retail)       [address]       [place], [place]  [id number]       Ph: [phone]        Fax: [phone]   RxID:   [id number] BYSTOLIC 10 MG TABS (NEBIVOLOL HCL) 1po once daily  #30 x 11   Entered and Authorized by:   Corwin Levins MD   Signed by:   Corwin Levins MD on 05/06/2010   Method used:   Print then Give to Patient   RxID:   [id number]

## 2010-10-22 NOTE — Miscellaneous (Signed)
Summary: Face to face encounter/[facility]  Face to face encounter/[facility]   Imported By: Sherian Rein 06/04/2010 08:53:00  _____________________________________________________________________  External Attachment:    Type:   Image     Comment:   External Document

## 2010-10-22 NOTE — Assessment & Plan Note (Signed)
Summary: wt loss/low bp/SD   Vital Signs:  Patient profile:   77 year old female Weight:      236 pounds BMI:     39.41 O2 Sat:      97 % on Room air Temp:     99.1 degrees F oral BP sitting:   100 / 64  (left arm) Cuff size:   large  Vitals Entered By: Zella Ball Ewing CMA Duncan Dull) (April 08, 2010 4:41 PM)  O2 Flow:  Room air CC: Weight loss, low BP, weak, Dizzy, SOB/RE   Primary Care Provider:  Dr. Oliver Barre  CC:  Weight loss, low BP, weak, Dizzy, and SOB/RE.  History of Present Illness: here wtih above for 4 days, with low grade temp today, ? reduced by mouth intake the last few days, as well as ? mild dysuria without chills, ST, prod cough, wheezing, CP , orrthopnea, pnd , worsening LE edema , palps or syncope.  Worst symtpom is the general weakness and lightheadedness on standing or even sitting up, but also states room spins as well  - ? vertigo, without hearing loss, n/v, tinnitus or headache.  Pt denies new neuro symptoms such as headache, facial or extremity weakness Pt denies polydipsia, polyuria, or low sugar symptoms such as shakiness improved with eating.  Overall good compliance with meds, trying to follow low chol, DM diet, wt stable, little excercise however CBG this AM was 95 at home per pt  Problems Prior to Update: 1)  Dizziness  (ICD-780.4) 2)  Dysuria  (ICD-788.1) 3)  Preventive Health Care  (ICD-V70.0) 4)  Constipation, Intermittent  (ICD-564.00) 5)  Fatigue  (ICD-780.79) 6)  Hemorrhoids  (ICD-455.6) 7)  Edema  (ICD-782.3) 8)  Fatigue  (ICD-780.79) 9)  Osteoarthritis, Knee, Right  (ICD-715.96) 10)  Peripheral Edema  (ICD-782.3) 11)  Dyspnea/shortness of Breath  (ICD-786.09) 12)  Abdominal Pain, Generalized  (ICD-789.07) 13)  Osteoarthritis, Knee, Left  (ICD-715.96) 14)  Hepatotoxicity, Drug-induced, Risk of  (ICD-V58.69) 15)  Interstitial Lung Disease  (ICD-515) 16)  Atrial Fibrillation  (ICD-427.31) 17)  Allergic Rhinitis  (ICD-477.9) 18)  Benign Carcinoid  Tumor of The Stomach  (ICD-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 Use As Directed 2)  Amlodipine Besylate 10 Mg Tabs (Amlodipine Besylate) .... Take 1 Tablet By Mouth Once A Day 3)  Fexofenadine Hcl 180 Mg Tabs (Fexofenadine Hcl) .... Take 1 Tablet By Mouth Once A Day 4)  Furosemide 20 Mg Tabs (Furosemide) .... 3 By Mouth Two Times A Day 5)  Lotrisone 1-0.05 % Crea (Clotrimazole-Betamethasone) .... Apply To Skin Twice A Day 6)  Lovastatin 40 Mg Tabs (Lovastatin) .... Take 1 Tablet By Mouth Once A Day 7)  Metformin Hcl 500 Mg Tb24 (Metformin Hcl) .... 4 By Mouth Once Daily 8)  Klor-Con 20 Meq Pack (Potassium Chloride) .... 3 By Mouth Once Daily 9)  Omeprazole 20 Mg  Cpdr (Omeprazole) .Marland Kitchen.. 1 By Mouth Once Daily 10)  Prednisone 10 Mg  Tabs (Prednisone) .Marland Kitchen.. 1 By Mouth Once Daily 11)  Ecotrin Low Strength 81 Mg  Tbec (Aspirin) .Marland KitchenMarland KitchenMarland Kitchen  1 By Mouth Qd 12)  Gnp Truetrack Smart System   Strp (Glucose Blood) .... Check Cbg 4 Times A Day and Prn 13)  Multi Vitamin .Marland Kitchen.. 1 By Mouth Once Daily 14)  O2 2 Lpm .... 24/7 15)  Bystolic 5 Mg Tabs (Nebivolol Hcl) .... One Daily  Current Medications (verified): 1)  Unilet Excelite Ii   Misc (Lancets) .... Use As Directed 2)  Amlodipine Besylate 10 Mg Tabs (Amlodipine Besylate) .... Take 1 Tablet By Mouth Once A Day 3)  Fexofenadine Hcl 180 Mg Tabs (Fexofenadine Hcl) .... Take 1 Tablet By Mouth Once A Day 4)  Furosemide 20 Mg Tabs (Furosemide) .... 3 By Mouth Two Times A Day 5)  Lotrisone 1-0.05 % Crea (Clotrimazole-Betamethasone) .... Apply To Skin Twice A Day 6)   Lovastatin 40 Mg Tabs (Lovastatin) .... Take 1 Tablet By Mouth Once A Day 7)  Metformin Hcl 500 Mg Tb24 (Metformin Hcl) .... 4 By Mouth Once Daily 8)  Klor-Con 20 Meq Pack (Potassium Chloride) .... 3 By Mouth Once Daily 9)  Omeprazole 20 Mg  Cpdr (Omeprazole) .Marland Kitchen.. 1 By Mouth Once Daily 10)  Ecotrin Low Strength 81 Mg  Tbec (Aspirin) .Marland Kitchen.. 1 By Mouth Qd 11)  Gnp Truetrack Smart System   Strp (Glucose Blood) .... Check Cbg 4 Times A Day and Prn 12)  Multi Vitamin .Marland Kitchen.. 1 By Mouth Once Daily 13)  O2 2 Lpm .... 24/7 14)  Bystolic 5 Mg Tabs (Nebivolol Hcl) .... One Daily 15)  Meclizine Hcl 12.5 Mg Tabs (Meclizine Hcl) .Marland Kitchen.. 1po Q 6 Hrs As Needed Dizziness 16)  Levaquin 500 Mg Tabs (Levofloxacin) .Marland Kitchen.. 1po Once Daily  Allergies (verified): No Known Drug Allergies  Past History:  Past Medical History: Last updated: 02/15/2010 GERD Hypertension Osteoarthritis Peptic ulcer disease Transient ischemic attack, hx of Congestive heart failure     - 2d echo 03/19/07 mild diastolic dysfunction, nl LA, RV Diabetes mellitus, type II Low back pain COPD      - Dr Ocie Doyne Depression gastric carcinoid  tumor Allergic rhinitis Atrial fibrillation Interstitial lung disease    - Onset 12/03 with definite steroid responsive component    - PFT's October 11, 2009 : VC 67%, no airflow obstruction, DLC0 14%  Past Surgical History: Last updated: 09/03/2007 Denies surgical history  Social History: Last updated: 02/15/2010 Never smoker No ETOH Widowed Children Lives with her daughter Retired Drug use-no  Risk Factors: Smoking Status: quit (09/03/2007)  Review of Systems       all otherwise negative per pt -    Physical Exam  General:  alert and overweight-appearing., mild ill, not confused   Head:  normocephalic and atraumatic.   Eyes:  vision grossly intact, pupils equal, and pupils round.   Ears:  bialt tm's mild erythema, sinus nontender Nose:  nasal dischargemucosal pallor and mucosal  edema.   Mouth:  pharyngeal erythema and fair dentition.   Neck:  supple and no masses.   Lungs:  normal respiratory effort, R decreased breath sounds, and L decreased breath sounds.  but overall good airflow, no wheezing or rales Heart:  normal rate and regular rhythm.   Abdomen:  soft, non-tender, and normal bowel sounds.   Msk:  no flank tender,  no spine tender throughout Extremities:  no edema, no erythema  Neurologic:  cranial nerves II-XII intact and strength normal in all extremities.   Skin:  color normal and no rashes.   Psych:  slightly anxious.     Impression &  Recommendations:  Problem # 1:  DYSURIA (ICD-788.1)  mild, for urine studies, levaquin x 7 days  Orders: T-Culture, Urine (16[address]-60454) TLB-Udip w/ Micro (81001-URINE)  Her updated medication list for this problem includes:    Levaquin 500 Mg Tabs (Levofloxacin) .Marland Kitchen... 1po once daily  Problem # 2:  DIZZINESS (ICD-780.4)  Her updated medication list for this problem includes:    Fexofenadine Hcl 180 Mg Tabs (Fexofenadine hcl) .Marland Kitchen... Take 1 tablet by mouth once a day    Meclizine Hcl 12.5 Mg Tabs (Meclizine hcl) .Marland Kitchen... 1po q 6 hrs as needed dizziness some orthostasis today ? due to recent URI/reduce by mouth intake - for meclizine as needed , and tx as below  Problem # 3:  HYPERTENSION (ICD-401.9)  Her updated medication list for this problem includes:    Amlodipine Besylate 10 Mg Tabs (Amlodipine besylate) .Marland Kitchen... Take 1 tablet by mouth once a day    Furosemide 20 Mg Tabs (Furosemide) .Marland KitchenMarland KitchenMarland KitchenMarland Kitchen 3 by mouth two times a day    Bystolic 5 Mg Tabs (Nebivolol hcl) ..... One daily to also hold the amlodipine for 1 wk, and take only AM lasix for one wk  Problem # 4:  DIABETES MELLITUS, TYPE II (ICD-250.00)  Her updated medication list for this problem includes:    Metformin Hcl 500 Mg Tb24 (Metformin hcl) .Marland KitchenMarland KitchenMarland KitchenMarland Kitchen 4 by mouth once daily    Ecotrin Low Strength 81 Mg Tbec (Aspirin) .Marland Kitchen... 1 by mouth qd  Labs  Reviewed: Creat: 1.1 (02/15/2010)    Reviewed HgBA1c results: 6.8 (02/15/2010)  7.1 (02/02/2009) stable overall by hx and exam, ok to continue meds/tx as is   Complete Medication List: 1)  Unilet Excelite Ii Misc (Lancets) .... Use as directed 2)  Amlodipine Besylate 10 Mg Tabs (Amlodipine besylate) .... Take 1 tablet by mouth once a day 3)  Fexofenadine Hcl 180 Mg Tabs (Fexofenadine hcl) .... Take 1 tablet by mouth once a day 4)  Furosemide 20 Mg Tabs (Furosemide) .... 3 by mouth two times a day 5)  Lotrisone 1-0.05 % Crea (Clotrimazole-betamethasone) .... Apply to skin twice a day 6)  Lovastatin 40 Mg Tabs (Lovastatin) .... Take 1 tablet by mouth once a day 7)  Metformin Hcl 500 Mg Tb24 (Metformin hcl) .... 4 by mouth once daily 8)  Klor-con 20 Meq Pack (Potassium chloride) .... 3 by mouth once daily 9)  Omeprazole 20 Mg Cpdr (Omeprazole) .Marland Kitchen.. 1 by mouth once daily 10)  Ecotrin Low Strength 81 Mg Tbec (Aspirin) .Marland Kitchen.. 1 by mouth qd 11)  Gnp Truetrack Smart System Strp (Glucose blood) .... Check cbg 4 times a day and prn 12)  Multi Vitamin  .Marland Kitchen.. 1 by mouth once daily 13)  O2 2 Lpm  .... 24/7 14)  Bystolic 5 Mg Tabs (Nebivolol hcl) .... One daily 15)  Meclizine Hcl 12.5 Mg Tabs (Meclizine hcl) .Marland Kitchen.. 1po q 6 hrs as needed dizziness 16)  Levaquin 500 Mg Tabs (Levofloxacin) .Marland Kitchen.. 1po once daily  Patient Instructions: 1)  Please take all new medications as prescribed  - the levaquin antibiotic, and the meclizine as needed dizziness 2)  please hold the amlodipine for one wk 3)  please decrease the furosemide to 3 pills in the AM only until next visit 4)  Please schedule a follow-up appointment in 1 week Prescriptions: LEVAQUIN 500 MG TABS (LEVOFLOXACIN) 1po once daily  #7 x 0   Entered and Authorized by:   Corwin Levins MD   Signed by:   Len Blalock  Jade Probus MD on 04/08/2010   Method used:   Print then Give to Patient   RxID:   [id number] MECLIZINE HCL 12.5 MG TABS (MECLIZINE HCL) 1po q 6 hrs  as needed dizziness  #40 x 1   Entered and Authorized by:   Corwin Levins MD   Signed by:   Corwin Levins MD on 04/08/2010   Method used:   Print then Give to Patient   RxID:   [id number]   Appended Document: wt loss/low bp/SD add:  orthostatic BP today:  lying:  114/74, then sitting 94/64 with marked symptoms or dizziness on sitting up from lying

## 2010-10-22 NOTE — Progress Notes (Signed)
Summary: Lab  Phone Note From Other Clinic   Caller: lab Summary of Call: lab called stating they were not able to do micro albumin on friday due to equipment problems. Do you want pt. to come back in and have it done? Initial call taken by: Scharlene Gloss,  Feb 19, 2010 10:10 AM  Follow-up for Phone Call        ok to hold off on doing this test for now Follow-up by: Corwin Levins MD,  Feb 19, 2010 10:20 AM  Additional Follow-up for Phone Call Additional follow up Details #1::        called Clydie Braun in the lab back to inform ok to hold on test for now, left msg. Additional Follow-up by: Scharlene Gloss,  Feb 19, 2010 11:06 AM

## 2010-10-22 NOTE — Progress Notes (Signed)
Summary: Verbal order  Phone Note Other Incoming   Caller: Crystal PT with [facility] [phone] Summary of Call: [facility] called requesting verbal order for pt to recieve services with a Child psychotherapist. Initial call taken by: Margaret Pyle, CMA,  April 15, 2010 11:23 AM  Follow-up for Phone Call        ok for verbal Follow-up by: Corwin Levins MD,  April 15, 2010 12:42 PM  Additional Follow-up for Phone Call Additional follow up Details #1::        called Crystal PT left msg. to call back Additional Follow-up by: Robin Ewing CMA Duncan Dull),  April 15, 2010 2:44 PM    Additional Follow-up for Phone Call Additional follow up Details #2::    PT informed via VM verbal okay Follow-up by: Margaret Pyle, CMA,  April 15, 2010 3:26 PM

## 2010-12-03 LAB — DIFFERENTIAL
Eosinophils Absolute: 0 10*3/uL (ref 0.0–0.7)
Monocytes Relative: 3 % (ref 3–12)

## 2010-12-03 LAB — COMPREHENSIVE METABOLIC PANEL
ALT: 18 U/L (ref 0–35)
AST: 23 U/L (ref 0–37)
BUN: 15 mg/dL (ref 6–23)
CO2: 35 mEq/L — ABNORMAL HIGH (ref 19–32)
Chloride: 104 mEq/L (ref 96–112)
GFR calc Af Amer: >60 mL/min (ref >60)
GFR calc non Af Amer: 50 mL/min — ABNORMAL LOW (ref >60)
Potassium: 4.6 mEq/L (ref 3.5–5.1)
Sodium: 148 mEq/L — ABNORMAL HIGH (ref 135–145)
Total Protein: 7.1 g/dL (ref 6.0–8.3)

## 2010-12-03 LAB — GLUCOSE, CAPILLARY: Glucose-Capillary: 171 mg/dL — ABNORMAL HIGH (ref 70–99)

## 2010-12-03 LAB — CBC
MCH: 30.4 pg (ref 26.0–34.0)
MCHC: 33.5 g/dL (ref 30.0–36.0)
Platelets: 214 10*3/uL (ref 150–400)
RBC: 4.06 MIL/uL (ref 3.87–5.11)
RDW: 14.6 % (ref 11.5–15.5)

## 2010-12-03 LAB — POCT CARDIAC MARKERS
Myoglobin, poc: 68 ng/mL (ref 12–200)
Troponin i, poc: <0.05 ng/mL (ref 0.00–0.09)

## 2010-12-08 LAB — CBC
HCT: 32.9 % — ABNORMAL LOW (ref 36.0–46.0)
HCT: 37.5 % (ref 36.0–46.0)
MCH: 30.5 pg (ref 26.0–34.0)
Platelets: 199 10*3/uL (ref 150–400)
RBC: 4.16 MIL/uL (ref 3.87–5.11)
RDW: 14.5 % (ref 11.5–15.5)

## 2010-12-08 LAB — CARDIAC PANEL(CRET KIN+CKTOT+MB+TROPI)
CK, MB: 0.8 ng/mL (ref 0.3–4.0)
CK, MB: 0.9 ng/mL (ref 0.3–4.0)
Relative Index: INVALID (ref 0.0–2.5)
Total CK: 31 U/L (ref 7–177)
Total CK: 36 U/L (ref 7–177)
Troponin I: 0.01 ng/mL (ref 0.00–0.06)
Troponin I: 0.01 ng/mL (ref 0.00–0.06)

## 2010-12-08 LAB — URINE CULTURE

## 2010-12-08 LAB — DIFFERENTIAL
Basophils Relative: 1 % (ref 0–1)
Eosinophils Absolute: 0 10*3/uL (ref 0.0–0.7)
Eosinophils Relative: 2 % (ref 0–5)
Lymphocytes Relative: 21 % (ref 12–46)
Monocytes Absolute: 0.7 10*3/uL (ref 0.1–1.0)
Monocytes Relative: 8 % (ref 3–12)
Neutro Abs: 8.4 10*3/uL — ABNORMAL HIGH (ref 1.7–7.7)
Neutrophils Relative %: 60 % (ref 43–77)

## 2010-12-08 LAB — CULTURE, BLOOD (ROUTINE X 2): Culture: NO GROWTH

## 2010-12-08 LAB — GLUCOSE, CAPILLARY
Glucose-Capillary: 158 mg/dL — ABNORMAL HIGH (ref 70–99)
Glucose-Capillary: 171 mg/dL — ABNORMAL HIGH (ref 70–99)
Glucose-Capillary: 179 mg/dL — ABNORMAL HIGH (ref 70–99)

## 2010-12-08 LAB — POCT I-STAT, CHEM 8
BUN: 25 mg/dL — ABNORMAL HIGH (ref 6–23)
Calcium, Ion: 1.12 mmol/L (ref 1.12–1.32)
Creatinine, Ser: 1.3 mg/dL — ABNORMAL HIGH (ref 0.4–1.2)
Sodium: 141 mEq/L (ref 135–145)
TCO2: 34 mmol/L (ref 0–100)

## 2010-12-08 LAB — LIPID PANEL
Total CHOL/HDL Ratio: 2 RATIO
VLDL: 18 mg/dL (ref 0–40)

## 2010-12-08 LAB — MAGNESIUM: Magnesium: 2.1 mg/dL (ref 1.5–2.5)

## 2010-12-08 LAB — POCT CARDIAC MARKERS: Troponin i, poc: <0.05 ng/mL (ref 0.00–0.09)

## 2010-12-08 LAB — LACTIC ACID, PLASMA: Lactic Acid, Venous: 3.1 mmol/L — ABNORMAL HIGH (ref 0.5–2.2)

## 2010-12-08 LAB — BASIC METABOLIC PANEL
BUN: 20 mg/dL (ref 6–23)
Calcium: 8.8 mg/dL (ref 8.4–10.5)
Creatinine, Ser: 1.03 mg/dL (ref 0.4–1.2)
GFR calc non Af Amer: 52 mL/min — ABNORMAL LOW (ref >60)
Glucose, Bld: 107 mg/dL — ABNORMAL HIGH (ref 70–99)
Potassium: 3.9 mEq/L (ref 3.5–5.1)

## 2010-12-08 LAB — PROCALCITONIN: Procalcitonin: <0.5 ng/mL

## 2010-12-08 LAB — HEMOGLOBIN A1C
Hgb A1c MFr Bld: 6.8 % — ABNORMAL HIGH (ref <5.7)
Mean Plasma Glucose: 148 mg/dL — ABNORMAL HIGH (ref <117)

## 2010-12-23 ENCOUNTER — Other Ambulatory Visit: Payer: Self-pay | Admitting: Internal Medicine

## 2010-12-23 NOTE — Telephone Encounter (Signed)
Pharmacy requesting refill on Omeprazole 20 mg.

## 2011-01-09 ENCOUNTER — Other Ambulatory Visit: Payer: Self-pay | Admitting: Internal Medicine

## 2011-02-07 NOTE — Discharge Summary (Signed)
[name], [name]                             ACCOUNT NO.:  [id number]   MEDICAL RECORD NO.:  [id number]                   PATIENT TYPE:  INP   LOCATION:  0364                                 FACILITY:  [facility]   PHYSICIAN:  Rene Paci, M.D. [facility]          DATE OF BIRTH:  1934-09-06   DATE OF ADMISSION:  09/16/2002  DATE OF DISCHARGE:  09/20/2002                                 DISCHARGE SUMMARY   DISCHARGE DIAGNOSES:  1. Shortness of breath and hypoxia believed secondary to atypical pneumonia.     Cannot exclude inflammatory interstitial process., improved.  2. Inactive thyroid goiter, normal TFTs  3. Hypertension, controlled.  4. History of stroke.  5. Gastroesophageal reflux disease.   DISCHARGE MEDICATIONS:  1. Doxycycline 100 mg one tablet p.o. b.i.d. x7 additional days.  2. Albuterol inhaler two puffs q.4h. p.r.n. shortness of breath.  3. Celexa 20 mg one-half pill q.d.  4. Lasix 20 mg q.d.  5. Imdur 30 mg q.d.  6. Relafen 500 mg b.i.d.  7. Plavix 75 mg q.d.  8. Cardizem CD 360 mg q.d.  9. K-Dur 20 mEq q.d.  10.      Prevacid 30 mg q.d.   DISPOSITION:  The patient is discharged to home where she lives with her  family.  She is to contact her primary care physician, Corwin Levins, M.D.  [facility] for follow-up appointment in early January.  At  that visit her O2 saturations and repeat chest x-ray will be performed.  If  reticular nodule infiltrates and/or hypoxia persists despite approximately  three week treatment of antibiotic therapy for presumed atypical pneumonia,  patient will be referred to pulmonary for further evaluation.  Disposition  on discharge improved.   PROCEDURE:  December 26:  A CT of the chest with contrast medium showing no  evidence of pulmonary embolism, extensive bilateral interstitial and air  space infiltrates with mediastinal and hilar adenopathy and scattered  pulmonary nodules likely to be inflammatory or infectious  process, but  unable to exclude interstitial tumor, mild cardiac enlargement, coronary  artery calcifications, large thyroid goiter.   BRIEF ADMISSION HISTORY AND PHYSICAL:  The patient is a pleasant 77 year old  woman who presented to the emergency room status post a fall while walking  her dog.  She reported feeling very short of breath, especially winded after  the fall.  On further evaluation chest x-ray revealed interstitial  infiltrates and patient was hypoxic.  A follow-up CT was obtained.  Results  as described above.  The patient was admitted for treatment of presumed  atypical pneumonia, pulmonary toilet, and oxygenation therapy.  For further  details please see admission H&P by Gordy Savers, M.D. [facility].   HOSPITAL COURSE:  Problem 1 - RETICULAR NODULAR PULMONARY INFILTRATE:  As  stated above, patient was admitted for presumed atypical pneumonia  associated with the radiology findings, hypoxia,  and low grade fevers.  She  was treated with azithromycin for a full five day course and oxygen therapy  with albuterol treatments.  The patient had extreme positive response and  felt much better at time of discharge.  She was feeling no shortness of  breath and oxygen saturations had improved into the mid 90s on room air.  The patient is instructed of the absolute need for follow-up of these chest  x-ray findings and will be continued on one more week of empiric antibiotics  as stated above for atypical pneumonia agents.  Problem 2 - INCIDENTAL THYROID GOITER:  This finding was observed on CT and  her TFTs were checked and found to be within normal limits.  Her TSH was  1.27.  No further evaluation has been done at this time.  This will be  deferred to her primary care M.D. if needed.  Problem 3 - OTHER MEDICAL PROBLEMS:  The patient was continued on her  routine home medicines for high blood pressure, depression, and GERD.  There  were no difficulties with any of these and no  medication changes were made.   LABORATORY DATA:  At time of discharge:  White count 5.6, hemoglobin 12.9,  platelets 209,000, normal differential.  Sedimentation rate 35.  Coags  normal.  CMET normal.  Angiotensin converting enzyme as normal with 15,  reference range being 9-67.                                               Rene Paci, M.D. [facility]    VL/MEDQ  D:  09/20/2002  T:  09/20/2002  Job:  [id number]

## 2011-02-07 NOTE — H&P (Signed)
NAMEMARISA, [name] NO.:  [id number]   MEDICAL RECORD NO.:  [id number]                   PATIENT TYPE:  EMS   LOCATION:  ED                                   FACILITY:  [facility]   PHYSICIAN:  Gordy Savers, M.D. [facility]      DATE OF BIRTH:  [birth year]   DATE OF ADMISSION:  09/16/2002  DATE OF DISCHARGE:                                HISTORY & PHYSICAL   CHIEF COMPLAINT:  Cough and shortness of breath.   HISTORY OF PRESENT ILLNESS:  The patient is a 77 year old black female  without known prior lung disease, who was stable until two weeks prior to  admission.  At that time, she was seen by her primary care physician for  evaluation of cough and some shortness of breath.  She was treated with an  unknown antibiotic as well as an expectorant.  Over the past two weeks, she  has not improved, and did not seem to respond to antibiotic therapy.  She  has developed worsening chest pain, cough, and more severe dyspnea on  exertion.  Her cough is mildly productive of some clear white sputum.  Because of her worsening symptoms, she was evaluated in the emergency  department where she was noted to have mild hypoxemia with a PO2 of 59.  A  chest x-ray and CT scan were obtained that revealed extensive air space and  interstitial disease, as well as mediastinal and hilar adenopathy.  The  patient is now admitted for further evaluation and treatment of her  undefined lung disease.   PAST MEDICAL HISTORY:  Details are incomplete at the present time, and the  patient is not a good historian.  She does have a long history of  hypertension, and she states a history of cardiac arrhythmias.  She has had  a remote stroke in the past which has resulted in some left-sided weakness.  Other medical problems include osteoarthritis, gastroesophageal reflux  disease, and a known history of thyromegaly.  She has not been hospitalized  for a number of years, but did have an ER  visit in 11/02.  Chest x-ray  revealed mild cardiomegaly and prominent interstitial markings, but no acute  pulmonary process noted.   MEDICATIONS:  1. Celexa 10 mg q.d.  2. Lasix 80 mg q.d.  3. Imdur 30 mg q.d.  4. Relafen 500 mg b.i.d.  5. Plavix 75 mg q.d.  6. Diltiazem extended release 360 mg q.d.  7. Potassium chloride 20 mEq q.d.  8. Prevacid 30 mg q.d.  9. On 09/02/02, the patient was treated with Bronchinex and an unknown     antibiotic.   PAST SURGICAL HISTORY:  1. Remote hysterectomy in 1992.  2. In 1992, underwent unclear abdominal surgery by Dr. Orpah Greek.  The patient     states that a tumor was removed, but does not think that this was colon.   REVIEW OF SYMPTOMS:  Otherwise fairly noncontributory.  There has been no  significant travel history in a number of years.  Until the onset of her  acute illness two weeks ago she was feeling quite well.  Denies any fever,  chills, weight loss, or other constitutional complaints.   PHYSICAL EXAMINATION:  VITAL SIGNS:  Pulse rate was 80, temperature 99  degrees, respiratory rate 14, blood pressure was 112/55.  GENERAL:  A well-developed healthy-appearing black female in no acute  distress at rest.  SKIN:  Unremarkable without rash.  HEENT:  Normal pupillary responses.  Conjunctivae clear.  ENT negative.  Dentures were in place.  NECK:  No bruits or neck vein distention.  There was no adenopathy.  CHEST:  Extensive rales involving the left hemithorax and also involving the  right basilar areas.  BREASTS:  Negative.  There is no axillary or supraclavicular adenopathy.  CARDIOVASCULAR:  Normal S1 and S2 without ectopics.  No murmurs identified.  ABDOMEN:  Obese, soft, nontender.  Well-healed surgical scars were present  in the midline.  GENITOURINARY:  External genitalia were normal.  EXTREMITIES:  No edema.  Peripheral pulses were full.  There is no clubbing.  Examination of the lower extremities revealed a deformity of the  left knee,  osteoarthritic changes were noted.  There was also a large Baker's cyst  involving the left popliteal fossa.  NEUROLOGIC:  Some weakness of the left leg.   IMPRESSION:  1. Diffuse reticular nodular lung disease, unclear etiology, possible     sarcoid lung disease in the setting of an acute inflammatory process.     Incomplete database.  2. History of hypertension.  3. Cerebrovascular disease.   DISPOSITION:  The patient will be admitted to the hospital.  The patient  will be treated with IV azithromycin and intensive pulmonary toilet.  She  will be placed on an expectorant.  The patient will be seen in consultation  by the pulmonary service.                                                 Gordy Savers, M.D. [facility]    PFK/MEDQ  D:  09/16/2002  T:  09/16/2002  Job:  [id number]

## 2011-02-07 NOTE — Discharge Summary (Signed)
NAMECHELSEA, [name]                             ACCOUNT NO.:  [id number]   MEDICAL RECORD NO.:  [id number]                   PATIENT TYPE:  INP   LOCATION:  0364                                 FACILITY:  [facility]   PHYSICIAN:  Rene Paci, M.D. [facility]          DATE OF BIRTH:  Nov 19, 1933   DATE OF ADMISSION:  09/21/2002  DATE OF DISCHARGE:  10/20/2002                                 DISCHARGE SUMMARY   DISCHARGE DIAGNOSES:  1. Debility and failure-to-thrive secondary to prolonged illness.  2. Status post ventilator-dependent respiratory failure secondary to adult     respiratory distress syndrome from prolonged pneumonia, steroid     responsive on long steroid taper.  3. Hypertension.  4. Gastroesophageal reflux disease.  5. Incidental thyroid goiter.  6. History of stroke.  7. Type 2 diabetes, steroid induced.  8. Status post vancomycin-resistant enterococcus cystitis, resolved.   DISCHARGE MEDICATIONS:  1. Xopenex nebulizers 1.25 mg q.i.d.  2. Zantac 150 mg p.o. b.i.d.  3. Lasix 60 mg p.o. daily.  4. Prednisone taper 40 mg p.o. daily x5 days, then 30 mg p.o. daily x5 days,     then 20 mg p.o. daily x5 days, then 10 mg p.o. daily indefinitely until     follow-up with Dr. Sherene Sires.  5. Lantus 20 units q.h.s.  6. Sliding scale insulin regular insulin ABGs a.c. and h.s. with coverage.  7. Tylenol 650 mg p.o. q.4h. p.r.n.  8. Restore barrier cream b.i.d.  9. Oxygen 2 liters per nasal cannula; titrate up to keep saturations greater     than 94%.   DISPOSITION ON DISCHARGE:  The patient is discharged in debilitated  condition from prolonged illness to [facility] nursing home where  she will continue to undergo rehab therapy.   FOLLOW-UP:  Hospital follow-up is scheduled with her pulmonologist, Dr. Francella Solian, at the [facility] building on [facility] for November 10, 2002 at 10  a.m.  She will remain on prednisone and oxygen until that time of follow-up.  She is also welcome  to contact her primary care physician, Dr. Efrain Sella,  also at the [facility] building, for medical need issues as they arise.   BRIEF ADMISSION HISTORY AND PHYSICAL:  The patient is a 77 year old black  female who had been hospital December 26 through September 20, 2002 for  shortness of breath and presumed atypical pneumonia.  She had been ill for  approximately a month prior to that first hospitalization but had felt  improved at the time of discharge.  However, she returned to the emergency  room about 30 hours after discharge secondary to increasing coughing,  shortness of breath, and fatigue once at home.  She had been unable to sleep  secondary to cough.  She denied sputum or hemoptysis, positive fever and  chills, vomiting associated with cough.  Chest x-ray showed dense bilateral  air space infiltrates and  her oxygen saturation on room air was 58%,  improved to 79% on 2 liters, then 96% on 100% nonrebreather.  She was  admitted to the ICU and pulmonary was consulted, as patient was at high risk  for intubation and respiratory failure.   HOSPITAL COURSE BY PROBLEM:  Problem 1.  Respiratory failure secondary to  ARDS and prolonged pneumonia.  The patient was supported with 100% face mask  for the first three days of hospitalization and then BiPAP for 24 hours.  However, due to worsening shortness of breath, increased work of breathing,  and declining oxygen saturations, the patient was intubated the morning of  January 4.  Critical care services managed the patient and continued her  initial high-dose steroids and broad-spectrum antibiotics.  Eventually, the  patient did improve and was extubated on January 18.  It is unclear the  exact nature of her pneumonia, but per Dr. Sherene Sires there appeared to be a  steroid-responsive component to the disease and therefore the patient is  continued on long-term prednisone until pulmonary follow-up is obtained.  The patient is still mildly hypoxic at  time of discharge but can be  maintained easily on nasal cannula 2-4 liters.  At the time of discharge her  saturation is 98% on 2 liters oxygen.  Follow-up is as scheduled with  pulmonary as discussed above.   Problem 2.  Steroid-induced diabetes.  The patient has been on high, high  dose steroids since admission and developed hyperglycemia.  This was thus  controlled with Lantus and additional sliding scale insulin.  At time of  discharge her CBGs are running somewhat high - in the upper 100s - on  current dose of Lantus 20 plus sliding scale insulin.  However, it is  anticipated that as her steroid are weaned off that her hyperglycemia will  also resolve and this needs to be watched carefully.  The patient had not  been diagnosed with diabetes prior to this hospitalization and is not  anticipated to need prolonged hyperglycemic agents.   Problem 3.  Debility.  Due to profound weakness from prolonged  hospitalization and illness, the patient has great difficulty with  ambulation and daily activities.  She is currently ambulating with the  assistance of two and a walker.  She had a bedside commode.  She is being  discharged to a skilled nursing facility where she will continue rehab  efforts.  Her family has been very supportive and will continue to help her  in this regard.   Problem 4.  VRE cystitis.  The patient had incidental finding of vancomycin-  resistant enterococcus on January 9 sensitive to nitrofurantoin.  She was  treated with 10 days of this antibiotic and was afebrile throughout its  course.  Urine culture before discharge was negative.   LABORATORY DATA AT TIME OF DISCHARGE:  January 28 UA and urine culture which  is negative.  BMP with sodium 141, potassium 3.7, chloride 103, bicarb 30,  glucose 128, BUN 21, creatinine 1.0, calcium 8.1.  White count 7.4,  hemoglobin 12.1, platelets 274, normal differential.                                               Rene Paci, M.D. [facility]    VL/MEDQ  D:  10/20/2002  T:  10/20/2002  Job:  [id number]

## 2011-02-07 NOTE — Discharge Summary (Signed)
[name], [name]                             ACCOUNT NO.:  [id number]   MEDICAL RECORD NO.:  [id number]                   PATIENT TYPE:  INP   LOCATION:  0463                                 FACILITY:  [facility]   PHYSICIAN:  Rene Paci, M.D. [facility]          DATE OF BIRTH:  1934-06-19   DATE OF ADMISSION:  05/30/2003  DATE OF DISCHARGE:  05/31/2003                                 DISCHARGE SUMMARY   DISCHARGE DIAGNOSES:  1. Dizziness/disequilibrium secondary to labyrinthitis, no evidence of     neurologic event. MRI negative.  2. Hypertension.  3. Iatrogenic type 2 diabetes secondary to prednisone.  4. Nausea secondary to #1, resolved.   DISCHARGE MEDICATIONS:  1. Norvasc 2.5 mg p.o. q.d.  2. Meclizine 25 mg p.o. q.6h. p.r.n. dizziness.  3. All other medications are as prior to admission and include Glucophage,     Ultracet, Zantac, prednisone, potassium and Lasix without change.   FOLLOW UP:  She is to followup with her primary care physician Dr. Efrain Sella  on September 22 at 9 a.m. for followup and resolution of the dizziness. She  is also to call Dr. Sherene Sires regarding prednisone taper for her lung disease.   DISPOSITION:  Home with home health PT and OT for vestibular training.   CONDITION ON DISCHARGE:  Medically stable.   HOSPITAL COURSE:  1. DIZZINESS.  The patient is a pleasant 77 year old woman who presented to     the emergency room with onset of dizziness associated with nausea and     vomiting upon awakening this morning around 5 a.m. Because of the nausea     and dizziness, she felt like she might pass out. She had no paresthesias,     hemiparesis, dysarthria, dysphagia or blurred vision, no headache, felt     dizzy while rolling over in bed. Dizziness generally improved with     maintaining a still position but did not completely resolve itself thus     was prompted by family to seek further evaluation in the emergency room.     She was admitted for evaluation of  disequilibrium which improved with     Valium and meclizine given in the emergency room. MRI was performed prior     to discharge to rule out BVI or acute stroke and none was found. As said,     she was discharged home to continue meclizine and home health PT, OT to     continue vestibular training. Should these symptoms persist she is     reminded to call her primary care physician sooner for an outpatient     neurologic evaluation.  2. Other medical issues are as listed above, no changes are made in her home     regimen.  Rene Paci, M.D. [facility]    VL/MEDQ  D:  06/29/2003  T:  06/29/2003  Job:  [id number]

## 2011-03-14 ENCOUNTER — Encounter: Payer: Self-pay | Admitting: Internal Medicine

## 2011-03-14 ENCOUNTER — Other Ambulatory Visit (INDEPENDENT_AMBULATORY_CARE_PROVIDER_SITE_OTHER): Payer: Medicare Other

## 2011-03-14 ENCOUNTER — Ambulatory Visit (INDEPENDENT_AMBULATORY_CARE_PROVIDER_SITE_OTHER): Payer: Medicare Other | Admitting: Internal Medicine

## 2011-03-14 VITALS — BP 140/90 | HR 81 | Temp 99.3°F | Ht 65.0 in | Wt 232.0 lb

## 2011-03-14 DIAGNOSIS — E119 Type 2 diabetes mellitus without complications: Secondary | ICD-10-CM

## 2011-03-14 DIAGNOSIS — R5383 Other fatigue: Secondary | ICD-10-CM

## 2011-03-14 DIAGNOSIS — I509 Heart failure, unspecified: Secondary | ICD-10-CM

## 2011-03-14 DIAGNOSIS — Z7409 Other reduced mobility: Secondary | ICD-10-CM | POA: Insufficient documentation

## 2011-03-14 DIAGNOSIS — K5900 Constipation, unspecified: Secondary | ICD-10-CM

## 2011-03-14 DIAGNOSIS — R69 Illness, unspecified: Secondary | ICD-10-CM

## 2011-03-14 DIAGNOSIS — R5381 Other malaise: Secondary | ICD-10-CM

## 2011-03-14 DIAGNOSIS — J449 Chronic obstructive pulmonary disease, unspecified: Secondary | ICD-10-CM

## 2011-03-14 LAB — CBC WITH DIFFERENTIAL/PLATELET
Basophils Relative: 0 % (ref 0.0–3.0)
Eosinophils Absolute: 0.1 10*3/uL (ref 0.0–0.7)
Eosinophils Relative: 0.5 % (ref 0.0–5.0)
Hemoglobin: 11.4 g/dL — ABNORMAL LOW (ref 12.0–15.0)
Lymphocytes Relative: 16.5 % (ref 12.0–46.0)
MCHC: 33.2 g/dL (ref 30.0–36.0)
MCV: 91.2 fl (ref 78.0–100.0)
Monocytes Absolute: 0.3 10*3/uL (ref 0.1–1.0)
Neutro Abs: 8.6 10*3/uL — ABNORMAL HIGH (ref 1.4–7.7)
Neutrophils Relative %: 80.4 % — ABNORMAL HIGH (ref 43.0–77.0)
RBC: 3.78 Mil/uL — ABNORMAL LOW (ref 3.87–5.11)
WBC: 10.7 10*3/uL — ABNORMAL HIGH (ref 4.5–10.5)

## 2011-03-14 LAB — BASIC METABOLIC PANEL
BUN: 24 mg/dL — ABNORMAL HIGH (ref 6–23)
CO2: 30 mEq/L (ref 19–32)
Chloride: 102 mEq/L (ref 96–112)
Creatinine, Ser: 1.3 mg/dL — ABNORMAL HIGH (ref 0.4–1.2)

## 2011-03-14 LAB — HEPATIC FUNCTION PANEL
ALT: 21 U/L (ref 0–35)
Total Protein: 6.6 g/dL (ref 6.0–8.3)

## 2011-03-14 LAB — LIPID PANEL
Cholesterol: 182 mg/dL (ref 0–200)
HDL: 75.7 mg/dL (ref >39.00)
LDL Cholesterol: 80 mg/dL (ref 0–99)
VLDL: 26.8 mg/dL (ref 0.0–40.0)

## 2011-03-14 LAB — HEMOGLOBIN A1C: Hgb A1c MFr Bld: 7 % — ABNORMAL HIGH (ref 4.6–6.5)

## 2011-03-14 MED ORDER — TRAMADOL HCL 50 MG PO TABS: 50.0000 mg | ORAL_TABLET | Freq: Four times a day (QID) | ORAL | Status: AC | PRN

## 2011-03-14 NOTE — Patient Instructions (Signed)
Your wheelchair form was filled out today You are given the prescriptoins also for the oxygen tank holder and seatbelt Continue all other medications as before You can also take Senakot OTC every night as needed for off and on constipation as we discussed You can also take 1 - 2 tabs of OTC tylenol arthritis every 8 hrs as needed for pain You should stop taking the Alleve as it can hurt your stomach You are also given the tramadol for more severe pain, but watch for any dizziness, confusion or nausea from this Please go to LAB in the Basement for the blood and/or urine tests to be done today Please call the phone number [phone] (the [employer]Tree System) for results of testing in 2-3 days;  When calling, simply dial the number, and when prompted enter the MRN number above (the Medical Record Number) and the # key, then the message should start. Please return in 6 months, or sooner if needed

## 2011-03-14 NOTE — Assessment & Plan Note (Signed)
stable overall by hx and exam, most recent data reviewed with pt, and pt to continue medical treatment as before - needs Home o2 as before

## 2011-03-14 NOTE — Progress Notes (Signed)
Subjective:    Patient ID: [name], female    DOB: [birth year], 77 y.o.   MRN: [id number]  HPI  Here to f/u;  Needs new oxygen concentrator which is allowed by medicare every 5 yrs, and hers is finally not working well after 8 yrs;  Rep from [place] here to assist.  Pt denies chest pain, increased sob or doe, wheezing, orthopnea, PND, increased LE swelling, palpitations, dizziness or syncope.  Pt denies new neurological symptoms such as new headache, or facial or extremity weakness or numbness.   Pt denies polydipsia, polyuria, or low sugar symptoms such as weakness or confusion improved with po intake.  Pt states overall good compliance with meds, trying to follow lower cholesterol, diabetic diet, wt overall stable but little exercise however.   Remains oxygen dependent  She is noted to have 02 sat 87% on RA today, and clearly continues to qualify for home 02 as she has had in the past;  Also needs oxygen tank holder for the replacement wheelchair she also needs -  a lightwt wheelchair to accomplish home ADL's as she cannot self-propel the standard wheelchair, and also needs seatbelt for the wheelchair for safety. Pt has multiple severe co-morbidities stable but chronic persistent that impair her from ambulating even room to room, bathing, preparing meals, toileting and grooming, the most relevant of which are CHF, COPD, severe bilat knee DJD and chronic back pain, and interstitial lung disease.  Also with c/o intermittent ongoing constipation not always improved with miralax,  Does have sense of ongoing fatigue, but denies signficant hypersomnolence.  Past Medical History  Diagnosis Date  . GERD 06/03/2007  . HYPERTENSION 06/03/2007  . OSTEOARTHRITIS 06/03/2007  . PEPTIC ULCER DISEASE 06/03/2007  . TRANSIENT ISCHEMIC ATTACK, HX OF 06/03/2007  . CONGESTIVE HEART FAILURE 06/03/2007    -2d echo 03/19/07 mild diastolic dysfunction, nl LA, RV  . DIABETES MELLITUS, TYPE II 06/03/2007  . LOW BACK PAIN 06/03/2007   . COPD 06/03/2007    Dr Ocie Doyne  . DEPRESSION 06/03/2007  . 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 Atrial fibrillation 08/10/2007  . HEMORRHOIDS 08/08/2009  . CONSTIPATION, INTERMITTENT 02/15/2010  . DYSPNEA/SHORTNESS OF BREATH 06/09/2008  . INTERSTITIAL LUNG DISEASE 08/10/2007    -onset 12/03 with definite steroid responsive component -PFT's October 11, 2009: VC 67%, no airflow obstruction DLC0 14%  . Allergic rhinitis    No past surgical history on file.  reports that she has never smoked. She does not have any smokeless tobacco history on file. She reports that she does not drink alcohol or use illicit drugs. family history includes Cancer in her sister and Hypertension in her father and mother. Allergies  Allergen Reactions  . Levofloxacin     REACTION: nausea   Current Outpatient Prescriptions on File Prior to Visit  Medication Sig Dispense Refill  . aspirin (ECOTRIN LOW STRENGTH) 81 MG EC tablet Take 81 mg by mouth daily.        . clotrimazole-betamethasone (LOTRISONE) cream APPLY TO SKIN TWICE DAILY AS NEEDED.  1 g  1  . fexofenadine (ALLEGRA) 180 MG tablet Take 180 mg by mouth daily.        . furosemide (LASIX) 20 MG tablet 3 tablets by mouth two times a day       . Lancets (UNILET EXCELITE II) MISC Use as directed       . lovastatin (MEVACOR) 40 MG tablet Take 40 mg by mouth daily.        Marland Kitchen  metFORMIN (GLUCOPHAGE-XR) 500 MG 24 hr tablet 4 tablets by mouth once daily       . Multiple Vitamin (MULTIVITAMIN) tablet Take 1 tablet by mouth daily.        . nebivolol (BYSTOLIC) 10 MG tablet Take 10 mg by mouth daily.        . NON FORMULARY Oxygen  2 LPM       . omeprazole (PRILOSEC) 20 MG capsule TAKE ONE (1) CAPSULE EACH DAY  30 capsule  8  . potassium chloride (KLOR-CON) 20 MEQ packet 3 by mouth once daily       . predniSONE (DELTASONE) 10 MG tablet Take 10 mg by mouth daily.        Marland Kitchen tiotropium (SPIRIVA HANDIHALER) 18 MCG inhalation capsule Place 18 mcg into  inhaler and inhale daily.        Marland Kitchen DISCONTD: meclizine (ANTIVERT) 12.5 MG tablet 1 tablet every 6 hours as needed for dizziness        Review of Systems Review of Systems  Constitutional: Negative for diaphoresis and unexpected weight change.  HENT: Negative for drooling and tinnitus.   Eyes: Negative for photophobia and visual disturbance.  Respiratory: Negative for choking and stridor.   Gastrointestinal: Negative for vomiting and blood in stool.  Genitourinary: Negative for hematuria and decreased urine volume.      Objective:   Physical Exam BP 140/90  Pulse 81  Temp(Src) 99.3 F (37.4 C) (Oral)  Ht 5\' 5"  (1.651 m)  Wt 232 lb (105.235 kg)  BMI 38.61 kg/m2  SpO2 87% on RA at rest Physical Exam  VS noted Constitutional: Pt appears well-developed and well-nourished.  HENT: Head: Normocephalic.  Right Ear: External ear normal.  Left Ear: External ear normal.  Eyes: Conjunctivae and EOM are normal. Pupils are equal, round, and reactive to light.  Neck: Normal range of motion. Neck supple.  Cardiovascular: Normal rate and regular rhythm.   Pulmonary/Chest: Effort normal and breath sounds normal.  Abd:  Soft, NT, non-distended, + BS Neurological: Pt is alert. No cranial nerve deficit.  Skin: Skin is warm. No erythema. stable 1+ edema to knees Psychiatric: Pt behavior is normal. Thought content normal. 1+ nervous        Assessment & Plan:

## 2011-03-14 NOTE — Assessment & Plan Note (Addendum)
Etiology unclear, Exam otherwise benign, to check labs as documented, follow with expectant management  

## 2011-03-14 NOTE — Assessment & Plan Note (Signed)
Chronic persistent, Filled out form for wheelchair, and rx for oxygen holder, and seatbelt

## 2011-03-14 NOTE — Assessment & Plan Note (Signed)
Ok for prn senakot otc prn ,  to f/u any worsening symptoms or concerns

## 2011-03-14 NOTE — Assessment & Plan Note (Signed)
stable overall by hx and exam, and pt to continue medical treatment as before 

## 2011-03-14 NOTE — Assessment & Plan Note (Signed)
stable overall by hx and exam, most recent data reviewed with pt, and pt to continue medical treatment as before  Lab Results  Component Value Date   HGBA1C  Value: 6.8 (NOTE)                                                                       According to the ADA Clinical Practice Recommendations for 2011, when HbA1c is used as a screening test:   >=6.5%   Diagnostic of Diabetes Mellitus           (if abnormal result  is confirmed)  5.7-6.4%   Increased risk of developing Diabetes Mellitus  References:Diagnosis and Classification of Diabetes Mellitus,Diabetes Care,2011,34(Suppl 1):S62-S69 and Standards of Medical Care in         Diabetes - 2011,Diabetes Care,2011,34  (Suppl 1):S11-S61.* 03/29/2010

## 2011-03-18 DIAGNOSIS — Z0279 Encounter for issue of other medical certificate: Secondary | ICD-10-CM

## 2011-03-24 ENCOUNTER — Other Ambulatory Visit: Payer: Self-pay | Admitting: Internal Medicine

## 2011-03-24 NOTE — Telephone Encounter (Signed)
Request for Prednisone 10 mg tab

## 2011-04-10 ENCOUNTER — Observation Stay (HOSPITAL_COMMUNITY)
Admission: EM | Admit: 2011-04-10 | Discharge: 2011-04-12 | Disposition: A | Discharge status: Home Health | Payer: Medicare Other | Attending: Family Medicine | Admitting: Family Medicine

## 2011-04-10 ENCOUNTER — Emergency Department (HOSPITAL_COMMUNITY): Payer: Medicare Other

## 2011-04-10 ENCOUNTER — Encounter (HOSPITAL_COMMUNITY): Payer: Self-pay | Admitting: Radiology

## 2011-04-10 ENCOUNTER — Telehealth: Payer: Self-pay | Admitting: *Deleted

## 2011-04-10 DIAGNOSIS — R110 Nausea: Secondary | ICD-10-CM | POA: Insufficient documentation

## 2011-04-10 DIAGNOSIS — R079 Chest pain, unspecified: Secondary | ICD-10-CM

## 2011-04-10 DIAGNOSIS — I10 Essential (primary) hypertension: Secondary | ICD-10-CM | POA: Insufficient documentation

## 2011-04-10 DIAGNOSIS — K429 Umbilical hernia without obstruction or gangrene: Secondary | ICD-10-CM | POA: Insufficient documentation

## 2011-04-10 DIAGNOSIS — R319 Hematuria, unspecified: Secondary | ICD-10-CM | POA: Insufficient documentation

## 2011-04-10 DIAGNOSIS — E119 Type 2 diabetes mellitus without complications: Secondary | ICD-10-CM | POA: Insufficient documentation

## 2011-04-10 DIAGNOSIS — Z79899 Other long term (current) drug therapy: Secondary | ICD-10-CM | POA: Insufficient documentation

## 2011-04-10 DIAGNOSIS — E041 Nontoxic single thyroid nodule: Secondary | ICD-10-CM | POA: Insufficient documentation

## 2011-04-10 DIAGNOSIS — Z7982 Long term (current) use of aspirin: Secondary | ICD-10-CM | POA: Insufficient documentation

## 2011-04-10 DIAGNOSIS — R0789 Other chest pain: Principal | ICD-10-CM | POA: Insufficient documentation

## 2011-04-10 DIAGNOSIS — I5032 Chronic diastolic (congestive) heart failure: Secondary | ICD-10-CM | POA: Insufficient documentation

## 2011-04-10 DIAGNOSIS — J8410 Pulmonary fibrosis, unspecified: Secondary | ICD-10-CM | POA: Insufficient documentation

## 2011-04-10 DIAGNOSIS — I509 Heart failure, unspecified: Secondary | ICD-10-CM | POA: Insufficient documentation

## 2011-04-10 DIAGNOSIS — R109 Unspecified abdominal pain: Secondary | ICD-10-CM | POA: Insufficient documentation

## 2011-04-10 DIAGNOSIS — R0602 Shortness of breath: Secondary | ICD-10-CM | POA: Insufficient documentation

## 2011-04-10 DIAGNOSIS — E785 Hyperlipidemia, unspecified: Secondary | ICD-10-CM | POA: Insufficient documentation

## 2011-04-10 LAB — PROTIME-INR: Prothrombin Time: 13.6 seconds (ref 11.6–15.2)

## 2011-04-10 LAB — DIFFERENTIAL
Eosinophils Relative: 1 % (ref 0–5)
Lymphocytes Relative: 27 % (ref 12–46)
Lymphs Abs: 2.3 10*3/uL (ref 0.7–4.0)
Monocytes Absolute: 0.8 10*3/uL (ref 0.1–1.0)
Neutro Abs: 5.3 10*3/uL (ref 1.7–7.7)

## 2011-04-10 LAB — URINALYSIS, ROUTINE W REFLEX MICROSCOPIC
Glucose, UA: NEGATIVE mg/dL
Leukocytes, UA: NEGATIVE
Specific Gravity, Urine: 1.013 (ref 1.005–1.030)
pH: 5.5 (ref 5.0–8.0)

## 2011-04-10 LAB — CBC
HCT: 35.9 % — ABNORMAL LOW (ref 36.0–46.0)
Hemoglobin: 11.3 g/dL — ABNORMAL LOW (ref 12.0–15.0)
MCV: 91.1 fL (ref 78.0–100.0)
RDW: 13.4 % (ref 11.5–15.5)
WBC: 8.6 10*3/uL (ref 4.0–10.5)

## 2011-04-10 LAB — BASIC METABOLIC PANEL
BUN: 23 mg/dL (ref 6–23)
Calcium: 9.8 mg/dL (ref 8.4–10.5)
Creatinine, Ser: 1.21 mg/dL — ABNORMAL HIGH (ref 0.50–1.10)
GFR calc Af Amer: 52 mL/min — ABNORMAL LOW (ref >60)
GFR calc non Af Amer: 43 mL/min — ABNORMAL LOW (ref >60)
Glucose, Bld: 97 mg/dL (ref 70–99)
Potassium: 3.7 mEq/L (ref 3.5–5.1)

## 2011-04-10 LAB — CARDIAC PANEL(CRET KIN+CKTOT+MB+TROPI)
CK, MB: 2 ng/mL (ref 0.3–4.0)
Relative Index: INVALID (ref 0.0–2.5)
Total CK: 32 U/L (ref 7–177)
Troponin I: <0.3 ng/mL (ref <0.30)

## 2011-04-10 LAB — CK TOTAL AND CKMB (NOT AT ARMC): Relative Index: INVALID (ref 0.0–2.5)

## 2011-04-10 LAB — D-DIMER, QUANTITATIVE: D-Dimer, Quant: 5.4 ug/mL-FEU — ABNORMAL HIGH (ref 0.00–0.48)

## 2011-04-10 LAB — PRO B NATRIURETIC PEPTIDE: Pro B Natriuretic peptide (BNP): 401.4 pg/mL (ref 0–450)

## 2011-04-10 LAB — URINE MICROSCOPIC-ADD ON

## 2011-04-10 LAB — GLUCOSE, CAPILLARY: Glucose-Capillary: 176 mg/dL — ABNORMAL HIGH (ref 70–99)

## 2011-04-10 MED ORDER — IOHEXOL 350 MG/ML SOLN
125.0000 mL | Freq: Once | INTRAVENOUS | Status: AC | PRN
  Administered 2011-04-10: 125 mL via INTRAVENOUS

## 2011-04-10 NOTE — Telephone Encounter (Signed)
Patient taken to [facility] [notation in chart Arrived 11:49am via Ambulance]

## 2011-04-10 NOTE — Telephone Encounter (Signed)
Received phone call that was mostly inaudible from [phone] which is Pt's phone# in EMR: was able to understand Pt say she "is sick, so sick, dizzy, feel like I am going to fall in the floor, can you please help". Called back to phone# and went to VM: LMOM to please have someone give our office a call to clarify Pt's condition. Located son's [Carlyle] work number in [employer] & called to inform him of the situation w/his mother & to determine if he had been contacted by her since the phone call came in to triage; he had not spoken w/her this morning. Son will attempt to contact his mother and call back to update as to the status of the situation.

## 2011-04-10 NOTE — Telephone Encounter (Signed)
I suspect she needs to go to ER now  Please encourage this

## 2011-04-11 DIAGNOSIS — R072 Precordial pain: Secondary | ICD-10-CM

## 2011-04-11 LAB — BASIC METABOLIC PANEL
CO2: 38 mEq/L — ABNORMAL HIGH (ref 19–32)
Calcium: 9 mg/dL (ref 8.4–10.5)
Creatinine, Ser: 1.19 mg/dL — ABNORMAL HIGH (ref 0.50–1.10)
GFR calc non Af Amer: 44 mL/min — ABNORMAL LOW (ref >60)
Glucose, Bld: 78 mg/dL (ref 70–99)
Sodium: 142 mEq/L (ref 135–145)

## 2011-04-11 LAB — GLUCOSE, CAPILLARY
Glucose-Capillary: 82 mg/dL (ref 70–99)
Glucose-Capillary: 161 mg/dL — ABNORMAL HIGH (ref 70–99)

## 2011-04-11 LAB — DIFFERENTIAL
Basophils Absolute: 0 10*3/uL (ref 0.0–0.1)
Basophils Relative: 0 % (ref 0–1)
Eosinophils Absolute: 0.1 10*3/uL (ref 0.0–0.7)
Neutro Abs: 3.4 10*3/uL (ref 1.7–7.7)
Neutrophils Relative %: 50 % (ref 43–77)

## 2011-04-11 LAB — CARDIAC PANEL(CRET KIN+CKTOT+MB+TROPI)
CK, MB: 1.9 ng/mL (ref 0.3–4.0)
CK, MB: 1.7 ng/mL (ref 0.3–4.0)
Relative Index: INVALID (ref 0.0–2.5)
Relative Index: INVALID (ref 0.0–2.5)
Total CK: 32 U/L (ref 7–177)
Total CK: 41 U/L (ref 7–177)

## 2011-04-11 LAB — CBC
Hemoglobin: 11 g/dL — ABNORMAL LOW (ref 12.0–15.0)
MCH: 28.9 pg (ref 26.0–34.0)
Platelets: 187 10*3/uL (ref 150–400)
RBC: 3.81 MIL/uL — ABNORMAL LOW (ref 3.87–5.11)
WBC: 6.8 10*3/uL (ref 4.0–10.5)

## 2011-04-11 LAB — URINE CULTURE
Colony Count: NO GROWTH
Culture: NO GROWTH

## 2011-04-12 LAB — GLUCOSE, CAPILLARY

## 2011-04-13 ENCOUNTER — Emergency Department (HOSPITAL_COMMUNITY): Payer: Medicare Other

## 2011-04-13 ENCOUNTER — Emergency Department (HOSPITAL_COMMUNITY)
Admission: EM | Admit: 2011-04-13 | Discharge: 2011-04-13 | Disposition: A | Discharge status: Home / Self Care | Payer: Medicare Other | Attending: Emergency Medicine | Admitting: Emergency Medicine

## 2011-04-13 DIAGNOSIS — K5900 Constipation, unspecified: Secondary | ICD-10-CM | POA: Insufficient documentation

## 2011-04-13 DIAGNOSIS — Z8673 Personal history of transient ischemic attack (TIA), and cerebral infarction without residual deficits: Secondary | ICD-10-CM | POA: Insufficient documentation

## 2011-04-13 DIAGNOSIS — J449 Chronic obstructive pulmonary disease, unspecified: Secondary | ICD-10-CM | POA: Insufficient documentation

## 2011-04-13 DIAGNOSIS — E119 Type 2 diabetes mellitus without complications: Secondary | ICD-10-CM | POA: Insufficient documentation

## 2011-04-13 DIAGNOSIS — J4489 Other specified chronic obstructive pulmonary disease: Secondary | ICD-10-CM | POA: Insufficient documentation

## 2011-04-13 DIAGNOSIS — I10 Essential (primary) hypertension: Secondary | ICD-10-CM | POA: Insufficient documentation

## 2011-04-13 DIAGNOSIS — I509 Heart failure, unspecified: Secondary | ICD-10-CM | POA: Insufficient documentation

## 2011-04-13 DIAGNOSIS — Z79899 Other long term (current) drug therapy: Secondary | ICD-10-CM | POA: Insufficient documentation

## 2011-04-13 DIAGNOSIS — R110 Nausea: Secondary | ICD-10-CM | POA: Insufficient documentation

## 2011-04-13 NOTE — Consult Note (Signed)
NAMENEETI, KNUDTSON NO.:  [id number]  MEDICAL RECORD NO.:  [id number]  LOCATION:  [facility]LED                         FACILITY:  [facility]  PHYSICIAN:  Jonelle Sidle, MD DATE OF BIRTH:  1934/06/03  DATE OF CONSULTATION: DATE OF DISCHARGE:                                CONSULTATION   PRIMARY CARE PHYSICIAN:  Corwin Levins, MD  REQUESTING SERVICE:  [facility].  REASON FOR CONSULTATION:  Chest pain.  HISTORY OF PRESENT ILLNESS:  [name] is a 77 year old woman with multiple comorbidities including hypertension, gastroesophageal reflux disease with peptic ulcer disease, previous transient ischemic attack, chronic diastolic heart failure, type 2 diabetes mellitus, COPD followed by Dr. Sherene Sires, atrial fibrillation (details not clear), interstitial lung disease, and prior episodes of chest pain.  She reports generally poor appetite over the last few weeks on baseline shortness of breath and chronic cough.  She uses oxygen 24 hours a day at 2-1/2 L via nasal cannula.  Her last visit with Dr. Jonny Ruiz was reviewed from late June at which time her functional status included use of a light wheelchair for home activities of daily living, some problems with intermittent constipation, ongoing sense of fatigue although no specific description of dyspnea on exertion or chest pain.  Review of the electronic medical record finds a telephone communication with Dr. Raphael Gibney office earlier this morning from the patient at which time she was difficult to understand, stating that she felt sick and dizzy.  It was recommended that she go to the emergency room.  In speaking with the patient and her son now, she is speaking clearly, not short of breath or hypoxic and in no respiratory distress.  Her son states that she did seem to be less clear this morning.  She states that yesterday she experienced discomfort in her left hip and leg area, lower back, and ultimately up in her  epigastric to chest wall region.  She does describe a heaviness in her chest, although no specific palpitations.  She is not reporting any continued chest pain now.  She reports compliance with her regular medications.  Presently, she is being admitted to the hospital for further evaluation by the hospitalist service.  Her initial cardiac markers were normal and her electrocardiogram shows sinus rhythm with left ventricular hypertrophy and nonspecific ST changes.  She did have an elevated D-dimer of 5.4 leading to a CT angiogram done earlier today that demonstrated no pulmonary embolus with stable marked changes of COPD as well as interstitial fibrosis.  Abdominal angiogram showed no acute abnormalities with extensive sigmoid diverticulosis, although no diverticulitis and a small umbilical hernia.  [name] states that she has been seen by a cardiologist in our practice on [address], possibly within the last year, although I cannot locate any records.  She describes studies that sounds consistent with a Myoview and an echocardiogram, although again these results are not available to me now in reviewing both electronic medical records.  I do see an echocardiogram from July 2011  read by Dr. Eden Emms that revealed mild left ventricular hypertrophy with an ejection fraction of 55-60% and no regional wall motion abnormalities.  Right ventricular function was described as normal, and there were no major valvular abnormalities.  ALLERGIES:  No known drug allergies.  MEDICATIONS:  Home medications include: 1. Lovastatin 40 mg p.o. daily. 2. Recent course of Avelox 400 mg p.o. daily. 3. Metformin 500 mg 4 tablets p.o. daily. 4. Multivitamin one p.o. daily. 5. Omeprazole 20 mg p.o. daily. 6. Vitamin D3 1000 units p.o. daily. 7. Spiriva 18 mcg 1 puff daily. 8. Prednisone 1 mg p.o. daily. 9. Potassium chloride 20 mEq p.o. t.i.d. 10.Hydrocodone APAP 5/325 mg p.o. q.6 h p.r.n. 11.Lasix 20  mg 3 tablets p.o. b.i.d. 12.Fexofenadine 180 mg p.o. daily. 13.Enteric-coated aspirin 81 mg p.o. daily. 14.Bystolic 5 mg p.o. daily.  PAST MEDICAL HISTORY:  Reviewed above.  Review of records finds prior diagnosis of pneumonia, possible peripheral neuropathy, nodular thyroid, no definite documentation of coronary artery disease or myocardial infarction, however.  Also previous history of vancomycin resistant enterococcal cystitis in 2004.  FAMILY HISTORY:  Significant for hypertension in the patient's mother and father, breast cancer and liver cancer in the patient's sister.  SOCIAL HISTORY:  No active tobacco or alcohol use.  She lives with her daughter and is retired.  REVIEW OF SYSTEMS:  As outlined above.  She denies any obvious fevers or chills.  Reports intermittent constipation.  No diarrhea.  Remains functional limited as discussed above.  No palpitations or syncope.  No falls.  No headache or focal motor weakness by report.  Otherwise reviewed and negative.  PHYSICAL EXAMINATION:  VITAL SIGNS:  On examination, the patient is afebrile.  Oxygen saturation is 100% on 2-1/2 L nasal cannula, respirations 16, heart rate 80 and in sinus rhythm, blood pressure 176/83. GENERAL:  This is an obese, chronically ill-appearing woman, in no acute distress and without active chest pain. HEENT:  Conjunctivae and lids normal.  Pharynx is clear with moist mucosa. NECK:  Supple with increased girth.  Difficult to assess JVP.  No obvious bruits. LUNGS:  Exhibit diminished breath sounds throughout with rhonchi and prolonged expiratory phase.  No wheezing or labored breathing. CARDIAC EXAM:  Reveals a regular rate and rhythm with soft basal systolic murmur.  Normal second heart sound.  No S3 gallop or rub. ABDOMEN:  Obese, nontender.  Bowel sounds are present. EXTREMITIES:  Exhibit trace edema around the ankles.  Distal pulses 1+. SKIN:  Warm and dry. MUSCULOSKELETAL:  No kyphosis is  noted. NEUROPSYCHIATRIC:  The patient is alert and oriented x3.  Affect is grossly appropriate.  LABORATORY DATA:  WBC 8.6, hemoglobin 11.3, hematocrit 35.9, platelets 199.  Sodium 142, potassium 3.7, chloride 99, bicarb 31, BUN 23, creatinine 1.2, glucose 97, INR 1.0, D-dimer 5.4, CK-MB 1.7, troponin-I less than 0.30, lactate 3.2.  IMPRESSION: 1. Atypical chest pain noted in association with other symptoms     including 2-week history of poor appetite, recent left hip and leg     pain, back pain, and epigastric discomfort.  Based on available     records, there is no clearly documented history of obstructive     coronary artery disease and myocardial infarction, although     potentially some history of acute on chronic diastolic heart     failure.  Most recent echocardiogram from July of last year is     noted above.  Initial cardiac markers are normal, and     electrocardiogram is nonspecific at this time.  She may have     undergone ischemic testing last year through our [address]  office, although I am not able to access any specific records at     this time. 2. Episode of confusion this morning, etiology not entirely certain.     This was transient, and the patient is reportedly back to baseline     per her son. 3. Chronic lung disease including significant chronic obstructive     pulmonary disease and interstitial lung disease, followed by Dr.     Sherene Sires, on continuous supplemental oxygen. 4. Gastroesophageal reflux disease. 5. Type 2 diabetes mellitus. 6. Hypertension, blood pressure elevated at present. 7. Hyperlipidemia, on statin therapy. 8. History of cerebrovascular disease/transient ischemic attack in the     past.  RECOMMENDATIONS:  The patient is being admitted to the hospitalist service for further evaluation.  At this point, would continue to cycle cardiac markers, follow up with ECG in the morning.  Followup 2-D echocardiogram will also be obtained.  Our  service will continue to follow with you.  Hopefully records will be obtained tomorrow regarding the patient's reported previous cardiac workup last year.  This may help to guide whether additional studies will be necessary at this time. Further plans to follow.     Jonelle Sidle, MD     SGM/MEDQ  D:  04/10/2011  T:  04/10/2011  Job:  [id number]  cc:   [facility]  Corwin Levins, MD [address] [place] [address]  Electronically Signed by Nona Dell MD on 04/13/2011 07:42:46 PM

## 2011-04-17 NOTE — Discharge Summary (Signed)
[name], [name]                   ACCOUNT NO.:  [id number]  MEDICAL RECORD NO.:  [id number]  LOCATION:  1414                         FACILITY:  [facility]  PHYSICIAN:  Erick Blinks, MD     DATE OF BIRTH:  1934-01-19  DATE OF ADMISSION:  04/10/2011 DATE OF DISCHARGE:                              DISCHARGE SUMMARY   PRIMARY CARE PHYSICIAN:  Dr. Oliver Barre  DISCHARGE DIAGNOSES: 1. Atypical chest pain, resolved., cardiac workup negative. 2. Hypertension. 3. Type 2 diabetes. 4. Hyperlipidemia. 5. Chronic diastolic congestive heart failure, compensated. 6. Chronic interstitial lung disease, on home oxygen. 7. Morbid obesity.  DISCHARGE MEDICATIONS: 1. Hydrocodone/APAP 5/325 mg 1 tablet p.o. q.6 h p.r.n. 2. Prednisone 1 mg p.o. daily. 3. Fexofenadine 180 mg 1 tablet p.o. daily. 4. Omeprazole 20 mg 1 tablet p.o. daily. 5. Metformin 500 mg 2 tablets p.o. b.i.d. 6. Potassium chloride 20 mEq 1 tablet p.o. t.i.d. 7. Bystolic 5 mg p.o. daily. 8. Lasix 60 mg p.o. b.i.d. 9. Lovastatin 40 mg 1 tablet p.o. q.p.m. 10.Vitamin D3 1000 units 1 tablet p.o. daily. 11.Spiriva 18 mcg 1 puff inhaled daily. 12.Aspirin enteric-coated 81 mg 1 tablet p.o. daily. 13.Multivitamins 1 tablet p.o. daily.  ADMISSION HISTORY:  This is a 77 year old female who presented with chest pain and left flank pain.  She lives at home with her daughter. The patient was brought to the hospital having some substernal chest pain.  This was worse with exertion.  She did have some nausea for the past few days.  She also states thats he has left flank pain which also comes and goes.  The patient was subsequently admitted for further cardiac workup.  For details, please refer to history and physical per Dr. Gerri Lins on April 10, 2011.  HOSPITAL COURSE: 1. Chest pain.  The patient was seen in consultation by [facility]     Cardiology.  Her enzymes have trended negative.  She had a 2-D     echocardiogram done which showed  preserved ejection fraction, EF of     55-60%, no wall motion abnormalities with grade 1 diastolic     dysfunction.  With no acute findings on her echocardiogram, further     cardiac workup was not recommended.  The patient's pain has     resolved.  She does no longer have any of her chest pain. 2. Left flank pain.  This seems to be musculoskeletal that has since     improved as well.  She had a CT of the abdomen and pelvis done     which ruled out any renal cell/urologic pathology.  She may follow     up with primary care physician for further management. 3. Diabetes.  She will be continued on metformin. 4. Hypertension which is stable.  This can be further managed by her     primary care physician.  CONSULTATIONS:   Cardiology  DIAGNOSTIC IMAGING: 1. Chest x-ray on admission shows stable pulmonary fibrosis and     cardiomegaly, no definite active process, question enlargement of     the right lobe of the thyroid. 2. CT angio of the chest shows no pulmonary emboli or  acute     abnormality seen.  Stable changes of COPD with interstitial     fibrosis and central lobular emphysema, stable thyroid goiter with     substernal extension and this could be better delineated with     elective thyroid ultrasound. 3. CT angio of the abdomen and pelvis shows no acute abnormality,     extensive sigmoid diverticulosis without evidence of     diverticulitis, small umbilical hernia containing fat 4. A 2D echocardiogram shows ejection fraction of 55-60%, no regional     wall motion abnormalities, grade 1 diastolic dysfunction.  DISCHARGE INSTRUCTIONS:  The patient should continue on a heart-healthy, low-calorie diet; conduct her activity as tolerated.  She will see her primary care physician in the next 1 to 2 weeks.  She is otherwise stable for discharge and feels improved.  She will be continued on home oxygen dosing.     Erick Blinks, MD     JM/MEDQ  D:  04/12/2011  T:   04/12/2011  Job:  [id number]  cc:   Corwin Levins, MD [address] [place] [address]  Electronically Signed by Erick Blinks  on 04/17/2011 01:17:09 AM

## 2011-04-25 ENCOUNTER — Emergency Department (HOSPITAL_COMMUNITY)
Admission: EM | Admit: 2011-04-25 | Discharge: 2011-04-25 | Disposition: A | Discharge status: Home / Self Care | Payer: Medicare Other | Attending: Emergency Medicine | Admitting: Emergency Medicine

## 2011-04-25 DIAGNOSIS — Z8673 Personal history of transient ischemic attack (TIA), and cerebral infarction without residual deficits: Secondary | ICD-10-CM | POA: Insufficient documentation

## 2011-04-25 DIAGNOSIS — B029 Zoster without complications: Secondary | ICD-10-CM | POA: Insufficient documentation

## 2011-04-25 DIAGNOSIS — Z9981 Dependence on supplemental oxygen: Secondary | ICD-10-CM | POA: Insufficient documentation

## 2011-04-25 DIAGNOSIS — I10 Essential (primary) hypertension: Secondary | ICD-10-CM | POA: Insufficient documentation

## 2011-04-25 DIAGNOSIS — I509 Heart failure, unspecified: Secondary | ICD-10-CM | POA: Insufficient documentation

## 2011-04-25 DIAGNOSIS — E119 Type 2 diabetes mellitus without complications: Secondary | ICD-10-CM | POA: Insufficient documentation

## 2011-04-25 DIAGNOSIS — J449 Chronic obstructive pulmonary disease, unspecified: Secondary | ICD-10-CM | POA: Insufficient documentation

## 2011-04-25 DIAGNOSIS — J4489 Other specified chronic obstructive pulmonary disease: Secondary | ICD-10-CM | POA: Insufficient documentation

## 2011-05-09 ENCOUNTER — Encounter: Payer: Self-pay | Admitting: Internal Medicine

## 2011-05-09 ENCOUNTER — Ambulatory Visit (INDEPENDENT_AMBULATORY_CARE_PROVIDER_SITE_OTHER): Payer: Medicare Other | Admitting: Internal Medicine

## 2011-05-09 DIAGNOSIS — E119 Type 2 diabetes mellitus without complications: Secondary | ICD-10-CM

## 2011-05-09 DIAGNOSIS — J449 Chronic obstructive pulmonary disease, unspecified: Secondary | ICD-10-CM

## 2011-05-09 DIAGNOSIS — B029 Zoster without complications: Secondary | ICD-10-CM

## 2011-05-09 DIAGNOSIS — I509 Heart failure, unspecified: Secondary | ICD-10-CM

## 2011-05-09 NOTE — Patient Instructions (Signed)
Continue all other medications as before You are no longer infectious with the chickenpox

## 2011-05-11 ENCOUNTER — Encounter: Payer: Self-pay | Admitting: Internal Medicine

## 2011-05-11 DIAGNOSIS — K5790 Diverticulosis of intestine, part unspecified, without perforation or abscess without bleeding: Secondary | ICD-10-CM

## 2011-05-11 DIAGNOSIS — B029 Zoster without complications: Secondary | ICD-10-CM | POA: Insufficient documentation

## 2011-05-11 DIAGNOSIS — Z0000 Encounter for general adult medical examination without abnormal findings: Secondary | ICD-10-CM | POA: Insufficient documentation

## 2011-05-11 DIAGNOSIS — E049 Nontoxic goiter, unspecified: Secondary | ICD-10-CM

## 2011-05-11 HISTORY — DX: Nontoxic goiter, unspecified: E04.9

## 2011-05-11 HISTORY — DX: Zoster without complications: B02.9

## 2011-05-11 HISTORY — DX: Diverticulosis of intestine, part unspecified, without perforation or abscess without bleeding: K57.90

## 2011-05-11 NOTE — Assessment & Plan Note (Signed)
stable overall by hx and exam, most recent data reviewed with pt, and pt to continue medical treatment as before  SpO2 Readings from Last 3 Encounters:  05/09/11 98%  03/14/11 87%  08/07/10 97%

## 2011-05-11 NOTE — Progress Notes (Signed)
Subjective:    Patient ID: [name], female    DOB: 08-09-1934, 77 y.o.   MRN: [id number]  HPI  Here to f/u after being seen in the ER with painful new onset rash approx 2 wks ago, tx with course of acyclovir and oxycodone, mod to severe initially left breast, chest/side/back now essentially improved with rash completely crusted, and no longer requiring pain medication;  Denies signficiant PHN type pain.  No other new complaints.  Family with her worried about their chance of developing chicken pox.  Pt denies chest pain, increased sob or doe, wheezing, orthopnea, PND, increased LE swelling, palpitations, dizziness or syncope.  Pt denies new neurological symptoms such as new headache, or facial or extremity weakness or numbness   Pt denies polydipsia, polyuria.   Past Medical History  Diagnosis Date  . GERD 06/03/2007  . HYPERTENSION 06/03/2007  . OSTEOARTHRITIS 06/03/2007  . PEPTIC ULCER DISEASE 06/03/2007  . TRANSIENT ISCHEMIC ATTACK, HX OF 06/03/2007  . CONGESTIVE HEART FAILURE 06/03/2007    -2d echo 03/19/07 mild diastolic dysfunction, nl LA, RV  . DIABETES MELLITUS, TYPE II 06/03/2007  . LOW BACK PAIN 06/03/2007  . COPD 06/03/2007    Dr Ocie Doyne  . DEPRESSION 06/03/2007  . 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 Atrial fibrillation 08/10/2007  . HEMORRHOIDS 08/08/2009  . CONSTIPATION, INTERMITTENT 02/15/2010  . DYSPNEA/SHORTNESS OF BREATH 06/09/2008  . INTERSTITIAL LUNG DISEASE 08/10/2007    -onset 12/03 with definite steroid responsive component -PFT's October 11, 2009: VC 67%, no airflow obstruction DLC0 14%  . Allergic rhinitis   . Diverticulosis 05/11/2011  . Goiter 05/11/2011   No past surgical history on file.  reports that she has never smoked. She does not have any smokeless tobacco history on file. She reports that she does not drink alcohol or use illicit drugs. family history includes Cancer in her sister and Hypertension in her father and mother. Allergies  Allergen  Reactions  . Levofloxacin     REACTION: nausea   Current Outpatient Prescriptions on File Prior to Visit  Medication Sig Dispense Refill  . aspirin (ECOTRIN LOW STRENGTH) 81 MG EC tablet Take 81 mg by mouth daily.        . clotrimazole-betamethasone (LOTRISONE) cream APPLY TO SKIN TWICE DAILY AS NEEDED.  1 g  1  . fexofenadine (ALLEGRA) 180 MG tablet Take 180 mg by mouth daily.        . furosemide (LASIX) 20 MG tablet 3 tablets by mouth two times a day       . Lancets (UNILET EXCELITE II) MISC Use as directed       . lovastatin (MEVACOR) 40 MG tablet Take 40 mg by mouth daily.        . metFORMIN (GLUCOPHAGE-XR) 500 MG 24 hr tablet 4 tablets by mouth once daily       . Multiple Vitamin (MULTIVITAMIN) tablet Take 1 tablet by mouth daily.        . nebivolol (BYSTOLIC) 10 MG tablet Take 10 mg by mouth daily.        . NON FORMULARY Oxygen  2 LPM       . omeprazole (PRILOSEC) 20 MG capsule TAKE ONE (1) CAPSULE EACH DAY  30 capsule  8  . potassium chloride (KLOR-CON) 20 MEQ packet 3 by mouth once daily       . predniSONE (DELTASONE) 10 MG tablet TAKE ONE (1) TABLET EACH DAY  30 tablet  5  . tiotropium (  SPIRIVA HANDIHALER) 18 MCG inhalation capsule Place 18 mcg into inhaler and inhale daily.         Review of Systems Review of Systems  Constitutional: Negative for diaphoresis and unexpected weight change.  HENT: Negative for drooling and tinnitus.   Eyes: Negative for photophobia and visual disturbance.  Respiratory: Negative for choking and stridor.   Gastrointestinal: Negative for vomiting and blood in stool.  Genitourinary: Negative for hematuria and decreased urine volume.        Objective:   Physical Exam BP 124/74  Pulse 87  Temp(Src) 99.3 F (37.4 C) (Oral)  SpO2 98% Physical Exam  VS noted Constitutional: Pt appears well-developed and well-nourished.  HENT: Head: Normocephalic.  Right Ear: External ear normal.  Left Ear: External ear normal.  Eyes: Conjunctivae and EOM  are normal. Pupils are equal, round, and reactive to light.  Neck: Normal range of motion. Neck supple.  Cardiovascular: Normal rate and irregular rhythm.   Pulmonary/Chest: Effort normal and breath sounds decreased bilat.  Abd:  Soft, NT, non-distended, + BS Neurological: Pt is alert. No cranial nerve deficit.  Skin: Skin is warm. No erythema. Had marked severe crusted over shingles type nontender rash to left back/side/chest/breast approx t5 level Psychiatric: Pt behavior is normal. Thought content normal. 1+ nervous        Assessment & Plan:

## 2011-05-11 NOTE — Assessment & Plan Note (Signed)
stable overall by hx and exam, most recent data reviewed with pt, and pt to continue medical treatment as before  Lab Results  Component Value Date   HGBA1C 7.0* 03/14/2011

## 2011-05-11 NOTE — Assessment & Plan Note (Signed)
Improved, no further tx needed with antimicrobial or pain med/no PHN by symtoms;   to f/u any worsening symptoms or concerns, educated, reassured pt and familyregarding natural hx

## 2011-05-11 NOTE — Assessment & Plan Note (Signed)
stable overall by hx and exam, most recent data reviewed with pt, and pt to continue medical treatment as before  Lab Results  Component Value Date   WBC 6.8 04/11/2011   HGB 11.0* 04/11/2011   HCT 34.5* 04/11/2011   PLT 187 04/11/2011   CHOL 182 03/14/2011   TRIG 134.0 03/14/2011   HDL 75.70 03/14/2011   ALT 21 03/14/2011   AST 38* 03/14/2011   NA 142 04/11/2011   K 3.6 04/11/2011   CL 100 04/11/2011   CREATININE 1.19* 04/11/2011   BUN 23 04/11/2011   CO2 38* 04/11/2011   TSH 0.67 03/14/2011   INR 1.02 04/10/2011   HGBA1C 7.0* 03/14/2011   MICROALBUR 2.0* 03/14/2011

## 2011-05-28 ENCOUNTER — Telehealth: Payer: Self-pay

## 2011-05-28 MED ORDER — TRAMADOL HCL 50 MG PO TABS: 50.0000 mg | ORAL_TABLET | Freq: Four times a day (QID) | ORAL | Status: AC | PRN

## 2011-05-28 NOTE — Telephone Encounter (Signed)
Patients daughter called requesting refill on pain medication for the patient. She is still experiencing pain from the shingles.

## 2011-05-28 NOTE — Telephone Encounter (Signed)
Done per emr 

## 2011-06-03 ENCOUNTER — Other Ambulatory Visit: Payer: Self-pay

## 2011-06-03 MED ORDER — FUROSEMIDE 20 MG PO TABS: ORAL_TABLET | ORAL | Status: DC

## 2011-06-03 MED ORDER — NEBIVOLOL HCL 10 MG PO TABS: 10.0000 mg | ORAL_TABLET | Freq: Every day | ORAL | Status: DC

## 2011-06-06 ENCOUNTER — Ambulatory Visit: Payer: PRIVATE HEALTH INSURANCE | Admitting: Internal Medicine

## 2011-06-10 NOTE — H&P (Signed)
NAMEKERSTIE, AGENT                   ACCOUNT NO.:  [id number]  MEDICAL RECORD NO.:  [id number]  LOCATION:  1414                         FACILITY:  [facility]  PHYSICIAN:  Omer Puccinelli, DO         DATE OF BIRTH:  November 28, 1933  DATE OF ADMISSION:  04/10/2011 DATE OF DISCHARGE:                             HISTORY & PHYSICAL   CHIEF COMPLAINT:  Chest pain, left flank pain.  HISTORY OF PRESENT ILLNESS:  The patient is a 77 year old elderly female who is quite debilitated and somewhat difficult to understand.  The patient states that this morning she began having substernal chest pain. She states that was stabbing like a knife.  It was somewhat fleeting, lasted for a couple moments, and then go away and then come back.  She stated the only way she could make it get better is that she just laid down and rested.  If she tried to do anything, it came back.  The patient also states that she has had nausea for the last couple of days and has had a poor appetite and she complains of left flank pain.  She states the left flank pain is colicky in kind, it comes and goes.  The left flank pain and the chest pain both have no palliative or provocative factors.  Chest pain the patient rated as 7/10 to 9/10 in severity at times.  The left flank pain is more constant pain and is about 3/10 or 4/10 that it flares up to 6 or 7.  She states that the nausea comes with the chest pain.  She also states that she is a little bit more short of breath with the chest pain, but denies diaphoresis. She has also had increased difficulty breathing over the past 2 days. She is on 2.5 L of O2 at home chronically.  PAST MEDICAL HISTORY: 1. Interstitial lung disease. 2. Hypertension. 3. Congestive heart failure. 4. Diastolic dysfunction. 5. Hyperlipidemia. 6. Diabetes mellitus. 7. Stroke. 8. GERD. 9. Morbid obesity.  PAST SURGICAL HISTORY:  Significant for total abdominal hysterectomy and she states she had stomach  tumors removed.  MEDICATIONS AT HOME:  As per a list in the computer to July 2011.  The patient does not know her home medications.  There is no accurate list on the chart this was the closest that I can find, 1. Tylenol 500 mg 1 p.o. nightly. 2. Norvasc 10 mg 1 p.o. daily. 3. Aspirin 81 mg subcutaneous daily. 4. Bystolic 5 mg 1 p.o. daily. 5. Allegra 180 mg 1 p.o. daily. 6. Lasix 60 mg 1 p.o. b.i.d. 7. Percocet 5/325 one p.o. q.6 h. p.r.n. pain. 8. Lovastatin 40 mg 1 p.o. daily. 9. Metformin 1000 mg 1 p.o. b.i.d. 10.Multivitamin 1 p.o. daily. 11.Prilosec 20 mg 1 p.o. daily. 12.K-Dur 20 mEq 1 p.o. t.i.d. 13.Prednisone 1 mg p.o. daily. 14.Spiriva 18 mcg inhaled daily. 15.Vitamin D3 1000 units daily.  ALLERGIES:  NKDA.  SOCIAL HISTORY:  No tobacco.  No alcohol.  No recreational drug use.  REVIEW OF SYSTEMS:  CONSTITUTIONAL:  The patient states she thinks she has had a low-grade fever.  No chills.  Positive for  weakness.  Positive for fatigue.  CNS:  No headaches.  No seizures.  No limb weakness.  ENT: No nasal congestion. No throat pain.  No coryza. CARDIOVASCULAR: Positive for chest pain.  Negative for palpitations.  Negative for orthopnea.  RESPIRATORY:  Negative for cough.  Positive for wheeze. Positive for shortness of breath.  Negative for sputum production. GASTROINTESTINAL:  Positive for nausea.  Negative for vomiting. Negative for constipation.  Negative for diarrhea.  GENITOURINARY:  No dysuria.  No hematuria.  No urinary frequency.  RENAL:  No flank pain. No swelling.  No pruritus.  SKIN:  No rashes.  No sores or lesions. HEMATOLOGIC:  No easy bruising.  No purpura, clots.  LYMPH:  No lymphadenopathy.  No painful nodes or specific lymph swelling. PSYCHIATRIC:  No anxiety.  No depression.  No insomnia.  PHYSICAL EXAMINATION:  VITAL SIGNS:  Temperature 98.0, pulse 76, respiratory rate 16, blood pressure 176/83, O2 saturation 100% on 2.5 L of O2.  GENERAL:  The patient  is morbidly obese.  She is elderly And she is somewhat difficult to understand.  She appears anxious. HEENT:  Eyes pupils equal, round, and reactive to light and condition. External ocular movements were intact.  Sclerae nonicteric, noninjected. Mouth, oral mucosa  moist.  No lesions.  No sores.  Pharynx, clear, no erythema, no exudate. NECK:  Negative for JVD.  Negative for thyromegaly.  Negative for lymphadenopathy. HEART:  Regular rate and rhythm at 80 beats per minute without murmur, ectopy, or gallops.  No lateral PMI.  No thrills. LUNGS:  Positive for rales at bases bilaterally.  No wheezes.  No rhonchi.  Positive for increased work of breathing.  No tactile fremitus. ABDOMEN:  Morbidly obese, soft, unable to evaluate for organomegaly.  No hernias palpated. EXTREMITIES:  Negative for cyanosis, clubbing, or edema.  The patient has diminished dorsalis pedis and popliteal pulses bilaterally.  No carotid bruits bilaterally. NEUROLOGIC:  Cranial nerves II through XII gross intact.  Motor and sensory intact.  The patient has some residual weakness on her left side with a 4/5 muscle strength in upper and lower extremities on the leftand 5/5 on the right.  LABORATORY DATA:  CT of the chest shows interstitial lung disease and COPD.  Urinalysis is negative.  Portable chest shows pulmonary fibrosis. CT of the abdomen and pelvis is negative except for a small umbilical hernia, containing fat.  CK is 31, MB 1.7.  Lactic acid 3.2.  Sodium 142, potassium 3.7, chloride 99, CO2 of 31, BUN 23, creatinine 1.21, glucose 92.  D-dimer is positive at 5.4.  BNP is 401.9.  ASSESSMENT: 1. Chest pain.  Differential diagnosis includes acute coronary     syndrome.  Certainly, the patient has risk factors for chest pain     including her family history of coronary artery disease,     hyperlipidemia, diabetes, hypertension. 2. Gastroesophageal reflux disease.  The patient does have notable      gastroesophageal reflux disease. 3. Musculoskeletal.  Exacerbation of the patient's diastolic     dysfunction with rales at bases bilaterally and increased work of     breathing.  We will increase her diuresis. 4. Diabetes mellitus.  The patient will have fingerstick blood sugars     and sliding scale insulin. 5. Hypertension.  We will continue the patient on what we believe to     be her home medications. 6. Left flank pain.  I believe this is probably musculoskeletal.     Certainly, the  patient does not have urinary tract infection.  Her     CT shows no perinephric stranding or ureteral dilatation and no     white count. 7. Hyperlipidemia.  PLAN:  The patient will be admitted to Telemetry.  Cardiology consulted. She will be ruled out for MI by EKG and enzyme criteria.  Her pain will be controlled.  She will have fingerstick blood sugars, sliding scale insulin.  We will continue her home medications as we believe they are and consult Cardiology.  I spent 40 minutes on this admission.          ______________________________ Fran Lowes, DO     AS/MEDQ  D:  04/10/2011  T:  04/11/2011  Job:  [id number]  cc:   Corwin Levins, MD [address] [place] [address]  Electronically Signed by Fran Lowes DO on 06/10/2011 01:54:53 PM

## 2011-06-19 ENCOUNTER — Encounter: Payer: Self-pay | Admitting: Internal Medicine

## 2011-06-19 ENCOUNTER — Ambulatory Visit (INDEPENDENT_AMBULATORY_CARE_PROVIDER_SITE_OTHER): Payer: Medicare Other | Admitting: Internal Medicine

## 2011-06-19 VITALS — BP 120/82 | HR 92 | Temp 99.7°F

## 2011-06-19 DIAGNOSIS — I10 Essential (primary) hypertension: Secondary | ICD-10-CM

## 2011-06-19 DIAGNOSIS — R269 Unspecified abnormalities of gait and mobility: Secondary | ICD-10-CM

## 2011-06-19 DIAGNOSIS — J019 Acute sinusitis, unspecified: Secondary | ICD-10-CM | POA: Insufficient documentation

## 2011-06-19 DIAGNOSIS — E119 Type 2 diabetes mellitus without complications: Secondary | ICD-10-CM

## 2011-06-19 MED ORDER — LEVOFLOXACIN 250 MG PO TABS: 250.0000 mg | ORAL_TABLET | Freq: Every day | ORAL | Status: AC

## 2011-06-19 MED ORDER — METFORMIN HCL ER 500 MG PO TB24: ORAL_TABLET | ORAL | Status: DC

## 2011-06-19 NOTE — Patient Instructions (Signed)
Take all new medications as prescribed - the antibiotic Continue all other medications as before You will be contacted regarding the referral for: Home Physical Therapy Please return in 3 months, or sooner if needed

## 2011-06-19 NOTE — Assessment & Plan Note (Signed)
stable overall by hx and exam, most recent data reviewed with pt, and pt to continue medical treatment as before  Lab Results  Component Value Date   HGBA1C 7.0* 03/14/2011    

## 2011-06-19 NOTE — Progress Notes (Signed)
Subjective:    Patient ID: [name], female    DOB: 12/16/1933, 77 y.o.   MRN: [id number]  HPI   Here with 3 days acute onset fever, facial pain, pressure, general weakness and malaise, and greenish d/c, with slight ST, but little to no cough and Pt denies chest pain, increased sob or doe, wheezing, orthopnea, PND, increased LE swelling, palpitations, dizziness or syncope.  Here to f/u; overall doing ok,  Pt denies chest pain, increased sob or doe, wheezing, orthopnea, PND, increased LE swelling, palpitations, dizziness or syncope.  Pt denies new neurological symptoms such as new headache, or facial or extremity weakness or numbness   Pt denies polydipsia, polyuria, or low sugar symptoms such as weakness or confusion improved with po intake.  Pt states overall good compliance with meds, trying to follow lower cholesterol, diabetic diet, wt overall stable but little exercise however. Also with 2 falls in the past 2 wks with increased overall weakness Past Medical History  Diagnosis Date  . GERD 06/03/2007  . HYPERTENSION 06/03/2007  . OSTEOARTHRITIS 06/03/2007  . PEPTIC ULCER DISEASE 06/03/2007  . TRANSIENT ISCHEMIC ATTACK, HX OF 06/03/2007  . CONGESTIVE HEART FAILURE 06/03/2007    -2d echo 03/19/07 mild diastolic dysfunction, nl LA, RV  . DIABETES MELLITUS, TYPE II 06/03/2007  . LOW BACK PAIN 06/03/2007  . COPD 06/03/2007    Dr Ocie Doyne  . DEPRESSION 06/03/2007  . 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 Atrial fibrillation 08/10/2007  . HEMORRHOIDS 08/08/2009  . CONSTIPATION, INTERMITTENT 02/15/2010  . DYSPNEA/SHORTNESS OF BREATH 06/09/2008  . INTERSTITIAL LUNG DISEASE 08/10/2007    -onset 12/03 with definite steroid responsive component -PFT's October 11, 2009: VC 67%, no airflow obstruction DLC0 14%  . Allergic rhinitis   . Diverticulosis 05/11/2011  . Goiter 05/11/2011  . Shingles 05/11/2011   No past surgical history on file.  reports that she has never smoked. She does not have  any smokeless tobacco history on file. She reports that she does not drink alcohol or use illicit drugs. family history includes Cancer in her sister and Hypertension in her father and mother. Allergies  Allergen Reactions  . Levofloxacin     REACTION: nausea   Current Outpatient Prescriptions on File Prior to Visit  Medication Sig Dispense Refill  . aspirin (ECOTRIN LOW STRENGTH) 81 MG EC tablet Take 81 mg by mouth daily.        . clotrimazole-betamethasone (LOTRISONE) cream APPLY TO SKIN TWICE DAILY AS NEEDED.  1 g  1  . fexofenadine (ALLEGRA) 180 MG tablet Take 180 mg by mouth daily.        . furosemide (LASIX) 20 MG tablet 3 tablets by mouth two times a day  180 tablet  11  . Lancets (UNILET EXCELITE II) MISC Use as directed       . lovastatin (MEVACOR) 40 MG tablet Take 40 mg by mouth daily.        . Multiple Vitamin (MULTIVITAMIN) tablet Take 1 tablet by mouth daily.        . nebivolol (BYSTOLIC) 10 MG tablet Take 1 tablet (10 mg total) by mouth daily.  30 tablet  11  . NON FORMULARY Oxygen  2 LPM       . omeprazole (PRILOSEC) 20 MG capsule TAKE ONE (1) CAPSULE EACH DAY  30 capsule  8  . potassium chloride (KLOR-CON) 20 MEQ packet 3 by mouth once daily       . predniSONE (DELTASONE) 10  MG tablet TAKE ONE (1) TABLET EACH DAY  30 tablet  5  . tiotropium (SPIRIVA HANDIHALER) 18 MCG inhalation capsule Place 18 mcg into inhaler and inhale daily.         Review of Systems Review of Systems  Constitutional: Negative for diaphoresis and unexpected weight change.  HENT: Negative for drooling and tinnitus.   Eyes: Negative for photophobia and visual disturbance.  Respiratory: Negative for choking and stridor.   Gastrointestinal: Negative for vomiting and blood in stool.  Genitourinary: Negative for hematuria and decreased urine volume.       Objective:   Physical Exam BP 120/82  Pulse 92  Temp(Src) 99.7 F (37.6 C) (Oral)  SpO2 94% Physical Exam  VS noted, mild  ill Constitutional: Pt appears well-developed and well-nourished.  HENT: Head: Normocephalic.  Right Ear: External ear normal.  Left Ear: External ear normal.  Bilat tm's mild erythema.  Sinus tender bilat.  Pharynx mild erythema Eyes: Conjunctivae and EOM are normal. Pupils are equal, round, and reactive to light.  Neck: Normal range of motion. Neck supple.  Cardiovascular: Normal rate and regular rhythm.   Pulmonary/Chest: Effort normal and breath sounds normal.  Abd:  Soft, NT, non-distended, + BS Neurological: Pt is alert. No cranial nerve deficit.  Skin: Skin is warm. No erythema.  Psychiatric: Pt behavior is normal. Thought content normal.         Assessment & Plan:

## 2011-06-19 NOTE — Assessment & Plan Note (Signed)
Mild to mod, for antibx course,  to f/u any worsening symptoms or concerns 

## 2011-06-19 NOTE — Assessment & Plan Note (Signed)
For home PT 

## 2011-06-19 NOTE — Assessment & Plan Note (Signed)
stable overall by hx and exam, most recent data reviewed with pt, and pt to continue medical treatment as before  BP Readings from Last 3 Encounters:  06/19/11 120/82  05/09/11 124/74  03/14/11 140/90

## 2011-06-24 ENCOUNTER — Other Ambulatory Visit: Payer: Self-pay | Admitting: Internal Medicine

## 2011-06-25 ENCOUNTER — Other Ambulatory Visit: Payer: Self-pay

## 2011-06-25 MED ORDER — GLUCOSE BLOOD VI STRP: ORAL_STRIP | Status: DC

## 2011-06-25 MED ORDER — UNILET EXCELITE II MISC: Status: DC

## 2011-07-01 ENCOUNTER — Telehealth: Payer: Self-pay

## 2011-07-01 NOTE — Telephone Encounter (Signed)
Ok for verbal 

## 2011-07-01 NOTE — Telephone Encounter (Signed)
Trey Paula PT with home health has been out to see the patient today. They are requesting a nurse to visit and do some meds. Teaching as the patient does not seem to understand what she is to take also does not know how to use her meter. Also they are requesting OT for the pateint as well to do ADL and grooming. A verbal ok is all they need. Please advise, Jeff's call back number is [phone]

## 2011-07-02 NOTE — Telephone Encounter (Signed)
Called Trey Paula left message to call back

## 2011-07-02 NOTE — Telephone Encounter (Signed)
Standley Dakins informed MD's ok to request.

## 2011-07-08 ENCOUNTER — Telehealth: Payer: Self-pay

## 2011-07-08 NOTE — Telephone Encounter (Signed)
Ok for verbal 

## 2011-07-08 NOTE — Telephone Encounter (Signed)
[facility] is requesting orders x 2 week to help pt with the use of her Glucometer.

## 2011-07-09 NOTE — Telephone Encounter (Signed)
[facility]N informed 

## 2011-07-15 DIAGNOSIS — I509 Heart failure, unspecified: Secondary | ICD-10-CM

## 2011-07-15 DIAGNOSIS — M6281 Muscle weakness (generalized): Secondary | ICD-10-CM

## 2011-07-15 DIAGNOSIS — R269 Unspecified abnormalities of gait and mobility: Secondary | ICD-10-CM

## 2011-07-15 DIAGNOSIS — I10 Essential (primary) hypertension: Secondary | ICD-10-CM

## 2011-07-24 ENCOUNTER — Other Ambulatory Visit: Payer: Self-pay | Admitting: Internal Medicine

## 2011-07-25 ENCOUNTER — Telehealth: Payer: Self-pay

## 2011-07-25 NOTE — Telephone Encounter (Signed)
[facility]N advised 

## 2011-07-25 NOTE — Telephone Encounter (Signed)
ok 

## 2011-07-25 NOTE — Telephone Encounter (Signed)
[facility] called requesting verbal orders to continue home visits 3/week for 2 weeks. Okay for verbal?

## 2011-07-30 ENCOUNTER — Encounter (HOSPITAL_COMMUNITY): Payer: Self-pay | Admitting: *Deleted

## 2011-07-30 ENCOUNTER — Emergency Department (HOSPITAL_COMMUNITY)
Admission: EM | Admit: 2011-07-30 | Discharge: 2011-07-30 | Disposition: A | Discharge status: Home / Self Care | Payer: Medicare Other | Attending: Emergency Medicine | Admitting: Emergency Medicine

## 2011-07-30 ENCOUNTER — Other Ambulatory Visit: Payer: Self-pay

## 2011-07-30 DIAGNOSIS — R110 Nausea: Secondary | ICD-10-CM | POA: Insufficient documentation

## 2011-07-30 DIAGNOSIS — I10 Essential (primary) hypertension: Secondary | ICD-10-CM | POA: Insufficient documentation

## 2011-07-30 DIAGNOSIS — K219 Gastro-esophageal reflux disease without esophagitis: Secondary | ICD-10-CM | POA: Insufficient documentation

## 2011-07-30 DIAGNOSIS — I509 Heart failure, unspecified: Secondary | ICD-10-CM | POA: Insufficient documentation

## 2011-07-30 DIAGNOSIS — J4489 Other specified chronic obstructive pulmonary disease: Secondary | ICD-10-CM | POA: Insufficient documentation

## 2011-07-30 DIAGNOSIS — E119 Type 2 diabetes mellitus without complications: Secondary | ICD-10-CM | POA: Insufficient documentation

## 2011-07-30 DIAGNOSIS — Z8679 Personal history of other diseases of the circulatory system: Secondary | ICD-10-CM | POA: Insufficient documentation

## 2011-07-30 DIAGNOSIS — J449 Chronic obstructive pulmonary disease, unspecified: Secondary | ICD-10-CM | POA: Insufficient documentation

## 2011-07-30 DIAGNOSIS — Z79899 Other long term (current) drug therapy: Secondary | ICD-10-CM | POA: Insufficient documentation

## 2011-07-30 LAB — COMPREHENSIVE METABOLIC PANEL
ALT: 16 U/L (ref 0–35)
AST: 21 U/L (ref 0–37)
Alkaline Phosphatase: 63 U/L (ref 39–117)
CO2: 32 mEq/L (ref 19–32)
Chloride: 94 mEq/L — ABNORMAL LOW (ref 96–112)
GFR calc non Af Amer: 33 mL/min — ABNORMAL LOW (ref >90)
Sodium: 138 mEq/L (ref 135–145)
Total Bilirubin: 0.2 mg/dL — ABNORMAL LOW (ref 0.3–1.2)

## 2011-07-30 LAB — URINALYSIS, ROUTINE W REFLEX MICROSCOPIC
Glucose, UA: NEGATIVE mg/dL
Ketones, ur: NEGATIVE mg/dL
Leukocytes, UA: NEGATIVE
pH: 6 (ref 5.0–8.0)

## 2011-07-30 MED ORDER — ONDANSETRON HCL 4 MG PO TABS
4.0000 mg | ORAL_TABLET | Freq: Once | ORAL | Status: AC
  Administered 2011-07-30: 4 mg via ORAL

## 2011-07-30 MED ORDER — ONDANSETRON HCL 4 MG PO TABS: 4.0000 mg | ORAL_TABLET | Freq: Once | ORAL | Status: AC

## 2011-07-30 MED ORDER — ONDANSETRON 4 MG PO TBDP
ORAL_TABLET | ORAL | Status: AC
  Administered 2011-07-30: 21:00:00
  Filled 2011-07-30: qty 1

## 2011-07-30 MED ORDER — ONDANSETRON 4 MG PO TBDP
ORAL_TABLET | ORAL | Status: AC
  Administered 2011-07-30: 4 mg
  Filled 2011-07-30: qty 1

## 2011-07-30 NOTE — ED Notes (Signed)
Pt in c/o nausea since this morning, also bilateral lower extremity swelling

## 2011-07-30 NOTE — ED Provider Notes (Signed)
I saw and evaluated the patient, reviewed the resident's note and I agree with the findings and plan.  Nelia Shi, MD 07/30/11 2155

## 2011-07-30 NOTE — ED Provider Notes (Signed)
History     CSN: [id number] Arrival date & time: 07/30/2011  7:29 PM   First MD Initiated Contact with Patient 07/30/11 2005      Chief Complaint  Patient presents with  . Nausea    (Consider location/radiation/quality/duration/timing/severity/associated sxs/prior treatment) The history is provided by the patient.  Pt presents with 1 day history of nausea, no actual emesis. It started sometime this morning and has not abated since. No new change to her medications. She is also having increased swelling of her legs coinciding with her not taking her lasix this morning. She states that it makes her go to the bathroom more and she does not like that. She has not had any fevers or chills, no pain anywhere, no diarrhea, last bowel movement was this morning and it was normal in appearance and caliber. She has not had this nausea before. She has not been around any sick contacts or recent travel. She has been able to keep down some food and liquids today although she states she has had less appetite.   Past Medical History  Diagnosis Date  . GERD 06/03/2007  . HYPERTENSION 06/03/2007  . OSTEOARTHRITIS 06/03/2007  . PEPTIC ULCER DISEASE 06/03/2007  . TRANSIENT ISCHEMIC ATTACK, HX OF 06/03/2007  . CONGESTIVE HEART FAILURE 06/03/2007    -2d echo 03/19/07 mild diastolic dysfunction, nl LA, RV  . DIABETES MELLITUS, TYPE II 06/03/2007  . LOW BACK PAIN 06/03/2007  . COPD 06/03/2007    Dr Ocie Doyne  . DEPRESSION 06/03/2007  . 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 Atrial fibrillation 08/10/2007  . HEMORRHOIDS 08/08/2009  . CONSTIPATION, INTERMITTENT 02/15/2010  . DYSPNEA/SHORTNESS OF BREATH 06/09/2008  . INTERSTITIAL LUNG DISEASE 08/10/2007    -onset 12/03 with definite steroid responsive component -PFT's October 11, 2009: VC 67%, no airflow obstruction DLC0 14%  . Allergic rhinitis   . Diverticulosis 05/11/2011  . Goiter 05/11/2011  . Shingles 05/11/2011    History reviewed. No pertinent  past surgical history.  Family History  Problem Relation Age of Onset  . Hypertension Mother   . Hypertension Father   . Cancer Sister     Breast Cancer, Liver Cancer    History  Substance Use Topics  . Smoking status: Never Smoker   . Smokeless tobacco: Not on file  . Alcohol Use: No    OB History    Grav Para Term Preterm Abortions TAB SAB Ect Mult Living                  Review of Systems  Constitutional: Negative for fever, chills, diaphoresis, activity change, appetite change, fatigue and unexpected weight change.  HENT: Negative for congestion, sore throat, rhinorrhea, sneezing, trouble swallowing, dental problem and sinus pressure.   Eyes: Negative.   Respiratory: Negative for apnea, cough, choking, chest tightness, shortness of breath, wheezing and stridor.   Cardiovascular: Positive for leg swelling. Negative for chest pain and palpitations.       Pt states that she has had more swelling in her legs.  Gastrointestinal: Negative for nausea, vomiting, abdominal pain, diarrhea, constipation, blood in stool, abdominal distention, anal bleeding and rectal pain.  Genitourinary: Positive for frequency. Negative for dysuria, urgency, hematuria, flank pain and difficulty urinating.       Pt takes lasix.  Musculoskeletal: Negative.   Skin: Negative.   Neurological: Negative.   Hematological: Negative.   Psychiatric/Behavioral: Negative.   All other systems reviewed and are negative.    Allergies  Levofloxacin  Home Medications   Current Outpatient Rx  Name Route Sig Dispense Refill  . ASPIRIN 81 MG PO TBEC Oral Take 81 mg by mouth daily.      Marland Kitchen CLOTRIMAZOLE-BETAMETHASONE 1-0.05 % EX CREA  APPLY TO SKIN TWICE DAILY AS NEEDED. 1 g 1  . FEXOFENADINE HCL 180 MG PO TABS Oral Take 180 mg by mouth daily.      . FUROSEMIDE 20 MG PO TABS  3 tablets by mouth two times a day 180 tablet 11  . LOVASTATIN 40 MG PO TABS Oral Take 40 mg by mouth daily.      Marland Kitchen METFORMIN HCL ER 500  MG PO TB24  2 tablets by mouth twice per day 120 tablet 11  . ONE-DAILY MULTI VITAMINS PO TABS Oral Take 1 tablet by mouth daily.      . NEBIVOLOL HCL 10 MG PO TABS Oral Take 1 tablet (10 mg total) by mouth daily. 30 tablet 11  . OMEPRAZOLE 20 MG PO CPDR  TAKE ONE (1) CAPSULE EACH DAY 30 capsule 8  . POTASSIUM CHLORIDE CRYS CR 20 MEQ PO TBCR  TAKE 3 TABLETS DAILY 90 tablet 5  . PREDNISONE 10 MG PO TABS  TAKE ONE (1) TABLET EACH DAY 30 tablet 5  . SPIRIVA HANDIHALER 18 MCG IN CAPS  INHALE CONTENTS OF ONE CAPSULE IN       HANDIHALER ONCE DAILY 30 each 5  . TRAMADOL HCL 50 MG PO TABS Oral Take 50 mg by mouth every 6 (six) hours as needed.        BP 134/71  Pulse 78  Temp(Src) 98 F (36.7 C) (Oral)  Resp 18  SpO2 97%  Physical Exam  Constitutional: She is oriented to person, place, and time. She appears well-developed and well-nourished. No distress.  HENT:  Head: Normocephalic and atraumatic.  Eyes: EOM are normal. Pupils are equal, round, and reactive to light.  Neck: Normal range of motion.  Cardiovascular: Normal rate and regular rhythm.   Pulmonary/Chest: Effort normal and breath sounds normal.       Prolonged expiratory phase, on nasal cannula home dose of O2.  Abdominal: Soft. Bowel sounds are normal.  Musculoskeletal: Normal range of motion.  Neurological: She is alert and oriented to person, place, and time. No cranial nerve deficit.  Skin: Skin is warm and dry. She is not diaphoretic.    ED Course  Procedures (including critical care time)  Labs Reviewed  URINALYSIS, ROUTINE W REFLEX MICROSCOPIC - Abnormal; Notable for the following:    Appearance CLOUDY (*)    All other components within normal limits  COMPREHENSIVE METABOLIC PANEL - Abnormal; Notable for the following:    Chloride 94 (*)    Glucose, Bld 135 (*)    Creatinine, Ser 1.48 (*)    Albumin 3.4 (*)    Total Bilirubin 0.2 (*)    GFR calc non Af Amer 33 (*)    GFR calc Af Amer 39 (*)    All other  components within normal limits  POCT I-STAT TROPONIN I  I-STAT TROPONIN I   No results found.   1. Nausea alone       MDM  The pt presents with nausea for 1 day which will be treated symptomatically. We will rule out atypical ACS and UTI with U/A and troponin. Also got CMP to rule out any liver abnormalities. We would advise her to resume her typical lasix dose to deal with the increased swelling in her legs.  I stat troponin was negative, U/A negative, CMP negative.   Nausea better with zofran and all tests resulted negative. Would recommend further evaluation by PCP if nausea continues.      Genella Mech, MD Resident 07/30/11 [phone]

## 2011-07-31 ENCOUNTER — Telehealth: Payer: Self-pay

## 2011-07-31 NOTE — Telephone Encounter (Signed)
Informed therapist of verbal ok from MD.

## 2011-07-31 NOTE — Telephone Encounter (Signed)
Ok for verbal 

## 2011-07-31 NOTE — Telephone Encounter (Signed)
[facility]rapist called requesting 2 additional week of physical therapy for pt, verbal authorization given to PT. Is that Okay?

## 2011-08-01 ENCOUNTER — Telehealth: Payer: Self-pay

## 2011-08-01 MED ORDER — ONDANSETRON HCL 4 MG PO TABS: 4.0000 mg | ORAL_TABLET | Freq: Every day | ORAL | Status: AC | PRN

## 2011-08-01 NOTE — Telephone Encounter (Signed)
Noted, though not really sure what this means  Pt should check cbg's and call if > 200

## 2011-08-01 NOTE — Telephone Encounter (Signed)
PharmD with [employer] [facility] and [facility] called stating that sxs pt has been complaining of "seem like lacticacidsosis" and suggest MD hold her Metformin until follow up appt.

## 2011-08-01 NOTE — Telephone Encounter (Signed)
Done per emr 

## 2011-08-01 NOTE — Telephone Encounter (Signed)
Patients daughter called to inform the patient went to the ER Wednesday night for dizziness and nausea. They are requesting a prescription for nausea

## 2011-08-06 ENCOUNTER — Other Ambulatory Visit (INDEPENDENT_AMBULATORY_CARE_PROVIDER_SITE_OTHER): Payer: Medicare Other

## 2011-08-06 ENCOUNTER — Encounter: Payer: Self-pay | Admitting: Internal Medicine

## 2011-08-06 ENCOUNTER — Telehealth: Payer: Self-pay

## 2011-08-06 ENCOUNTER — Ambulatory Visit (INDEPENDENT_AMBULATORY_CARE_PROVIDER_SITE_OTHER): Payer: Medicare Other | Admitting: Internal Medicine

## 2011-08-06 VITALS — BP 110/60 | HR 80 | Temp 99.0°F

## 2011-08-06 DIAGNOSIS — R509 Fever, unspecified: Secondary | ICD-10-CM | POA: Insufficient documentation

## 2011-08-06 DIAGNOSIS — E119 Type 2 diabetes mellitus without complications: Secondary | ICD-10-CM

## 2011-08-06 DIAGNOSIS — Z23 Encounter for immunization: Secondary | ICD-10-CM

## 2011-08-06 DIAGNOSIS — J449 Chronic obstructive pulmonary disease, unspecified: Secondary | ICD-10-CM

## 2011-08-06 DIAGNOSIS — I10 Essential (primary) hypertension: Secondary | ICD-10-CM

## 2011-08-06 LAB — BASIC METABOLIC PANEL
Chloride: 101 mEq/L (ref 96–112)
Creatinine, Ser: 1.6 mg/dL — ABNORMAL HIGH (ref 0.4–1.2)
Potassium: 4.6 mEq/L (ref 3.5–5.1)
Sodium: 145 mEq/L (ref 135–145)

## 2011-08-06 LAB — LIPID PANEL
LDL Cholesterol: 65 mg/dL (ref 0–99)
Total CHOL/HDL Ratio: 2

## 2011-08-06 NOTE — Telephone Encounter (Signed)
[facility] informed.  

## 2011-08-06 NOTE — Telephone Encounter (Signed)
[facility] called requesting verbal for social worker to assist pt with community resources. Verbal okay?

## 2011-08-06 NOTE — Telephone Encounter (Signed)
Ok for verbal 

## 2011-08-06 NOTE — Patient Instructions (Signed)
You had the flu shot today Continue all other medications as before Please go to LAB in the Basement for the blood and/or urine tests to be done today Please call the phone number [phone] (the [employer]Tree System) for results of testing in 2-3 days;  When calling, simply dial the number, and when prompted enter the MRN number above (the Medical Record Number) and the # key, then the message should start. Please return in 6 months, or sooner if needed 

## 2011-08-07 ENCOUNTER — Telehealth: Payer: Self-pay

## 2011-08-07 LAB — URINALYSIS, ROUTINE W REFLEX MICROSCOPIC
Hgb urine dipstick: NEGATIVE
Nitrite: NEGATIVE
Specific Gravity, Urine: 1.015 (ref 1.000–1.030)
Total Protein, Urine: NEGATIVE
pH: 6 (ref 5.0–8.0)

## 2011-08-07 NOTE — Telephone Encounter (Signed)
Patient was seen in the office 08/06/2011 and forgot to ask about metformin. She was recently taken off Metformin due to it causing nausea, Does she need to go back or can you provide an alternative as concerned nausea will start back. Call back number  [phone]

## 2011-08-07 NOTE — Telephone Encounter (Signed)
Called the patient and spoke to her daughter, she does not know how long she has taken the metformin. Informed awaiting results to make further decision on medication.

## 2011-08-07 NOTE — Telephone Encounter (Signed)
Please find out how long pt has not been taking the metformin  The a1c lab is still pending.  Further medication will depend on the A1cl, and pt should be informed at that time

## 2011-08-07 NOTE — Telephone Encounter (Signed)
The question was how long she has NOT been taking the metformin?  (will make a difference in evaluating the pending a1c)

## 2011-08-08 ENCOUNTER — Telehealth: Payer: Self-pay

## 2011-08-08 MED ORDER — GLIMEPIRIDE 2 MG PO TABS: 2.0000 mg | ORAL_TABLET | ORAL | Status: DC

## 2011-08-08 NOTE — Telephone Encounter (Signed)
Called and spoke to the patient she stopped the Metformin July 29, 2011.

## 2011-08-08 NOTE — Telephone Encounter (Signed)
PT called requesting additional orders 2/week for 2 weeks. Verbal authorized per [facility]

## 2011-08-08 NOTE — Telephone Encounter (Signed)
Pt stopped metformin 100 bid on nov 6 (full dose);  Most recent a1c 6.6 nov 14  Pt will need replacement med, also noted cr increased to 1.6 recent  To start glimeparide 2 mg in the am  Pt to cont to monitor blood sugars, call if > 200 consistently

## 2011-08-08 NOTE — Telephone Encounter (Signed)
Called the patients daughter informed of results and medication .

## 2011-08-10 ENCOUNTER — Encounter: Payer: Self-pay | Admitting: Internal Medicine

## 2011-08-10 NOTE — Assessment & Plan Note (Signed)
Exam benign,  to f/u any worsening symptoms or concerns  

## 2011-08-10 NOTE — Assessment & Plan Note (Signed)
stable overall by hx and exam, most recent data reviewed with pt, and pt to continue medical treatment as before SpO2 Readings from Last 3 Encounters:  08/06/11 99%  07/30/11 100%  06/19/11 94%

## 2011-08-10 NOTE — Assessment & Plan Note (Signed)
stable overall by hx and exam, most recent data reviewed with pt, and pt to continue medical treatment as before e Lab Results  Component Value Date   HGBA1C 6.6* 08/06/2011

## 2011-08-10 NOTE — Progress Notes (Signed)
Subjective:    Patient ID: [name], female    DOB: 05-19-1934, 77 y.o.   MRN: [id number]  HPI  Here to f/u; overall doing ok,  Pt denies chest pain, increased sob or doe, wheezing, orthopnea, PND, increased LE swelling, palpitations, dizziness or syncope.  Pt denies new neurological symptoms such as new headache, or facial or extremity weakness or numbness   Pt denies polydipsia, polyuria, or low sugar symptoms such as weakness or confusion improved with po intake.  Pt states overall good compliance with meds, trying to follow lower cholesterol, diabetic diet, wt overall stable but little exercise however.  Not aware of any fever, and  Denies urinary symptoms such as dysuria, frequency, urgency,or hematuria.  For flu shot today Past Medical History  Diagnosis Date  . GERD 06/03/2007  . HYPERTENSION 06/03/2007  . OSTEOARTHRITIS 06/03/2007  . PEPTIC ULCER DISEASE 06/03/2007  . TRANSIENT ISCHEMIC ATTACK, HX OF 06/03/2007  . CONGESTIVE HEART FAILURE 06/03/2007    -2d echo 03/19/07 mild diastolic dysfunction, nl LA, RV  . DIABETES MELLITUS, TYPE II 06/03/2007  . LOW BACK PAIN 06/03/2007  . COPD 06/03/2007    Dr Ocie Doyne  . DEPRESSION 06/03/2007  . 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 Atrial fibrillation 08/10/2007  . HEMORRHOIDS 08/08/2009  . CONSTIPATION, INTERMITTENT 02/15/2010  . DYSPNEA/SHORTNESS OF BREATH 06/09/2008  . INTERSTITIAL LUNG DISEASE 08/10/2007    -onset 12/03 with definite steroid responsive component -PFT's October 11, 2009: VC 67%, no airflow obstruction DLC0 14%  . Allergic rhinitis   . Diverticulosis 05/11/2011  . Goiter 05/11/2011  . Shingles 05/11/2011   No past surgical history on file.  reports that she has never smoked. She does not have any smokeless tobacco history on file. She reports that she does not drink alcohol or use illicit drugs. family history includes Cancer in her sister and Hypertension in her father and mother. Allergies  Allergen Reactions    . Levofloxacin Nausea Only    REACTION: nausea   Current Outpatient Prescriptions on File Prior to Visit  Medication Sig Dispense Refill  . aspirin (ECOTRIN LOW STRENGTH) 81 MG EC tablet Take 81 mg by mouth daily.        . clotrimazole-betamethasone (LOTRISONE) cream        . fexofenadine (ALLEGRA) 180 MG tablet Take 180 mg by mouth daily.        . furosemide (LASIX) 20 MG tablet 3 tablets by mouth two times a day  180 tablet  11  . lovastatin (MEVACOR) 40 MG tablet Take 40 mg by mouth daily.        . Multiple Vitamin (MULTIVITAMIN) tablet Take 1 tablet by mouth daily.        . nebivolol (BYSTOLIC) 10 MG tablet Take 1 tablet (10 mg total) by mouth daily.  30 tablet  11  . omeprazole (PRILOSEC) 20 MG capsule TAKE ONE (1) CAPSULE EACH DAY  30 capsule  8  . ondansetron (ZOFRAN) 4 MG tablet Take 1 tablet (4 mg total) by mouth daily as needed for nausea.  30 tablet  1  . potassium chloride SA (K-DUR,KLOR-CON) 20 MEQ tablet TAKE 3 TABLETS DAILY  90 tablet  5  . predniSONE (DELTASONE) 10 MG tablet TAKE ONE (1) TABLET EACH DAY  30 tablet  5  . SPIRIVA HANDIHALER 18 MCG inhalation capsule INHALE CONTENTS OF ONE CAPSULE IN       HANDIHALER ONCE DAILY  30 each  5  . traMADol (  ULTRAM) 50 MG tablet Take 50 mg by mouth every 6 (six) hours as needed. Pain.       Review of Systems Review of Systems  Constitutional: Negative for diaphoresis and unexpected weight change.  HENT: Negative for drooling and tinnitus.   Eyes: Negative for photophobia and visual disturbance.  Respiratory: Negative for choking and stridor.   Gastrointestinal: Negative for vomiting and blood in stool.  Genitourinary: Negative for hematuria and decreased urine volume.  Musculoskeletal: Negative for gait problem.       Objective:   Physical Exam BP 110/60  Pulse 80  Temp(Src) 99 F (37.2 C) (Oral)  SpO2 99% Physical Exam  VS noted Constitutional: Pt appears well-developed and well-nourished.  HENT: Head: Normocephalic.   Right Ear: External ear normal.  Left Ear: External ear normal.  Eyes: Conjunctivae and EOM are normal. Pupils are equal, round, and reactive to light.  Neck: Normal range of motion. Neck supple.  Cardiovascular: Normal rate and regular rhythm.   Pulmonary/Chest: Effort normal and breath sounds normal.  Abd:  Soft, NT, non-distended, + BS Neurological: Pt is alert. No cranial nerve deficit.  Skin: Skin is warm. No erythema.  Psychiatric: Pt behavior is normal. Thought content normal.     Assessment & Plan:

## 2011-08-10 NOTE — Assessment & Plan Note (Signed)
stable overall by hx and exam, most recent data reviewed with pt, and pt to continue medical treatment as before  BP Readings from Last 3 Encounters:  08/06/11 110/60  07/30/11 143/77  06/19/11 120/82

## 2011-08-26 ENCOUNTER — Other Ambulatory Visit: Payer: Self-pay

## 2011-08-26 MED ORDER — LOVASTATIN 40 MG PO TABS: 40.0000 mg | ORAL_TABLET | Freq: Every day | ORAL | Status: DC

## 2011-08-27 ENCOUNTER — Telehealth: Payer: Self-pay

## 2011-08-27 NOTE — Telephone Encounter (Signed)
Verbal is ok.

## 2011-08-27 NOTE — Telephone Encounter (Signed)
[facility] requesting a verbal ok to continue seeing the patient once a week for the next 3 weeks and thereafter for 60 days to see her every other week. The patient has had a  Change in her diabetic medication and [facility] thinks the patient will still need to be checked on . A verbal ok is requested. Call back number is [phone]

## 2011-08-27 NOTE — Telephone Encounter (Signed)
Informed [facility] of verbal ok to all

## 2011-09-19 DIAGNOSIS — E119 Type 2 diabetes mellitus without complications: Secondary | ICD-10-CM

## 2011-09-19 DIAGNOSIS — I509 Heart failure, unspecified: Secondary | ICD-10-CM

## 2011-09-19 DIAGNOSIS — I10 Essential (primary) hypertension: Secondary | ICD-10-CM

## 2011-09-19 DIAGNOSIS — J449 Chronic obstructive pulmonary disease, unspecified: Secondary | ICD-10-CM

## 2011-09-24 ENCOUNTER — Other Ambulatory Visit: Payer: Self-pay | Admitting: Internal Medicine

## 2011-10-14 ENCOUNTER — Telehealth: Payer: Self-pay

## 2011-10-14 NOTE — Telephone Encounter (Signed)
RN with HH called to inform patient not feeling well today, oxygen was 93 percent (the RN adjusted oxygen as was placed incorrectly), then went up to 98 percent. The patient has edema in both legs. The patient called EMS this AM as was nauseated and had left arm pain.  EMS informed the patient she was not having a heart attack.  The RN would like a verbal order to continue with this patient . She inform the patient to call EMS if she had these symptoms again. Call back number for [facility] is [phone] to give verbal ok to continue care.

## 2011-10-14 NOTE — Telephone Encounter (Signed)
ok 

## 2011-10-15 NOTE — Telephone Encounter (Signed)
RN informed of verbal order.

## 2011-11-03 ENCOUNTER — Other Ambulatory Visit: Payer: Self-pay | Admitting: Internal Medicine

## 2011-12-23 ENCOUNTER — Other Ambulatory Visit: Payer: Self-pay | Admitting: Internal Medicine

## 2011-12-25 ENCOUNTER — Other Ambulatory Visit: Payer: Self-pay

## 2011-12-25 ENCOUNTER — Inpatient Hospital Stay (HOSPITAL_COMMUNITY)
Admission: EM | Admit: 2011-12-25 | Discharge: 2011-12-26 | DRG: 280 | Disposition: A | Discharge status: Home Health | Payer: Medicare Other | Attending: Internal Medicine | Admitting: Internal Medicine

## 2011-12-25 ENCOUNTER — Encounter (HOSPITAL_COMMUNITY): Payer: Self-pay | Admitting: *Deleted

## 2011-12-25 ENCOUNTER — Emergency Department (HOSPITAL_COMMUNITY): Payer: Medicare Other

## 2011-12-25 DIAGNOSIS — I10 Essential (primary) hypertension: Secondary | ICD-10-CM

## 2011-12-25 DIAGNOSIS — I5043 Acute on chronic combined systolic (congestive) and diastolic (congestive) heart failure: Secondary | ICD-10-CM | POA: Diagnosis present

## 2011-12-25 DIAGNOSIS — Z79899 Other long term (current) drug therapy: Secondary | ICD-10-CM

## 2011-12-25 DIAGNOSIS — J4489 Other specified chronic obstructive pulmonary disease: Secondary | ICD-10-CM | POA: Diagnosis present

## 2011-12-25 DIAGNOSIS — I509 Heart failure, unspecified: Secondary | ICD-10-CM | POA: Diagnosis present

## 2011-12-25 DIAGNOSIS — D3A092 Benign carcinoid tumor of the stomach: Secondary | ICD-10-CM

## 2011-12-25 DIAGNOSIS — I129 Hypertensive chronic kidney disease with stage 1 through stage 4 chronic kidney disease, or unspecified chronic kidney disease: Secondary | ICD-10-CM | POA: Diagnosis present

## 2011-12-25 DIAGNOSIS — I4891 Unspecified atrial fibrillation: Secondary | ICD-10-CM | POA: Diagnosis present

## 2011-12-25 DIAGNOSIS — J309 Allergic rhinitis, unspecified: Secondary | ICD-10-CM | POA: Diagnosis present

## 2011-12-25 DIAGNOSIS — D631 Anemia in chronic kidney disease: Secondary | ICD-10-CM | POA: Diagnosis present

## 2011-12-25 DIAGNOSIS — E119 Type 2 diabetes mellitus without complications: Secondary | ICD-10-CM | POA: Diagnosis present

## 2011-12-25 DIAGNOSIS — K649 Unspecified hemorrhoids: Secondary | ICD-10-CM

## 2011-12-25 DIAGNOSIS — Z8679 Personal history of other diseases of the circulatory system: Secondary | ICD-10-CM

## 2011-12-25 DIAGNOSIS — I214 Non-ST elevation (NSTEMI) myocardial infarction: Principal | ICD-10-CM | POA: Diagnosis present

## 2011-12-25 DIAGNOSIS — E441 Mild protein-calorie malnutrition: Secondary | ICD-10-CM | POA: Diagnosis present

## 2011-12-25 DIAGNOSIS — J8410 Pulmonary fibrosis, unspecified: Secondary | ICD-10-CM | POA: Diagnosis present

## 2011-12-25 DIAGNOSIS — F3289 Other specified depressive episodes: Secondary | ICD-10-CM | POA: Diagnosis present

## 2011-12-25 DIAGNOSIS — K319 Disease of stomach and duodenum, unspecified: Secondary | ICD-10-CM | POA: Diagnosis present

## 2011-12-25 DIAGNOSIS — J449 Chronic obstructive pulmonary disease, unspecified: Secondary | ICD-10-CM

## 2011-12-25 DIAGNOSIS — Z6838 Body mass index (BMI) 38.0-38.9, adult: Secondary | ICD-10-CM

## 2011-12-25 DIAGNOSIS — K219 Gastro-esophageal reflux disease without esophagitis: Secondary | ICD-10-CM | POA: Diagnosis present

## 2011-12-25 DIAGNOSIS — F329 Major depressive disorder, single episode, unspecified: Secondary | ICD-10-CM

## 2011-12-25 DIAGNOSIS — E785 Hyperlipidemia, unspecified: Secondary | ICD-10-CM | POA: Diagnosis present

## 2011-12-25 DIAGNOSIS — N189 Chronic kidney disease, unspecified: Secondary | ICD-10-CM | POA: Diagnosis present

## 2011-12-25 DIAGNOSIS — M1990 Unspecified osteoarthritis, unspecified site: Secondary | ICD-10-CM | POA: Diagnosis present

## 2011-12-25 DIAGNOSIS — N184 Chronic kidney disease, stage 4 (severe): Secondary | ICD-10-CM | POA: Diagnosis present

## 2011-12-25 DIAGNOSIS — Z7982 Long term (current) use of aspirin: Secondary | ICD-10-CM

## 2011-12-25 DIAGNOSIS — I517 Cardiomegaly: Secondary | ICD-10-CM

## 2011-12-25 DIAGNOSIS — N039 Chronic nephritic syndrome with unspecified morphologic changes: Secondary | ICD-10-CM | POA: Diagnosis present

## 2011-12-25 DIAGNOSIS — J9620 Acute and chronic respiratory failure, unspecified whether with hypoxia or hypercapnia: Secondary | ICD-10-CM | POA: Diagnosis not present

## 2011-12-25 DIAGNOSIS — K279 Peptic ulcer, site unspecified, unspecified as acute or chronic, without hemorrhage or perforation: Secondary | ICD-10-CM | POA: Diagnosis present

## 2011-12-25 DIAGNOSIS — I5022 Chronic systolic (congestive) heart failure: Secondary | ICD-10-CM | POA: Diagnosis present

## 2011-12-25 DIAGNOSIS — IMO0002 Reserved for concepts with insufficient information to code with codable children: Secondary | ICD-10-CM

## 2011-12-25 LAB — URINALYSIS, ROUTINE W REFLEX MICROSCOPIC
Glucose, UA: NEGATIVE mg/dL
Leukocytes, UA: NEGATIVE
Protein, ur: 30 mg/dL — AB
Specific Gravity, Urine: 1.017 (ref 1.005–1.030)
Urobilinogen, UA: 0.2 mg/dL (ref 0.0–1.0)

## 2011-12-25 LAB — CBC
MCH: 28.8 pg (ref 26.0–34.0)
MCHC: 32 g/dL (ref 30.0–36.0)
MCV: 90.1 fL (ref 78.0–100.0)
Platelets: 180 10*3/uL (ref 150–400)
RBC: 3.85 MIL/uL — ABNORMAL LOW (ref 3.87–5.11)
RDW: 13.5 % (ref 11.5–15.5)

## 2011-12-25 LAB — GLUCOSE, CAPILLARY
Glucose-Capillary: 116 mg/dL — ABNORMAL HIGH (ref 70–99)
Glucose-Capillary: 80 mg/dL (ref 70–99)
Glucose-Capillary: 139 mg/dL — ABNORMAL HIGH (ref 70–99)

## 2011-12-25 LAB — CARDIAC PANEL(CRET KIN+CKTOT+MB+TROPI)
CK, MB: 1.7 ng/mL (ref 0.3–4.0)
Relative Index: INVALID (ref 0.0–2.5)
Total CK: 76 U/L (ref 7–177)
Troponin I: <0.3 ng/mL (ref <0.30)

## 2011-12-25 LAB — COMPREHENSIVE METABOLIC PANEL
AST: 23 U/L (ref 0–37)
CO2: 36 mEq/L — ABNORMAL HIGH (ref 19–32)
Calcium: 9.4 mg/dL (ref 8.4–10.5)
Creatinine, Ser: 1.7 mg/dL — ABNORMAL HIGH (ref 0.50–1.10)
GFR calc Af Amer: 33 mL/min — ABNORMAL LOW (ref >90)
GFR calc non Af Amer: 28 mL/min — ABNORMAL LOW (ref >90)
Total Protein: 7 g/dL (ref 6.0–8.3)

## 2011-12-25 LAB — URINE MICROSCOPIC-ADD ON

## 2011-12-25 LAB — PROTIME-INR
INR: 1.03 (ref 0.00–1.49)
Prothrombin Time: 13.7 seconds (ref 11.6–15.2)

## 2011-12-25 MED ORDER — FUROSEMIDE 10 MG/ML IJ SOLN
40.0000 mg | Freq: Every day | INTRAMUSCULAR | Status: DC
  Administered 2011-12-26: 40 mg via INTRAVENOUS
  Filled 2011-12-25 (×2): qty 4

## 2011-12-25 MED ORDER — CLOTRIMAZOLE 1 % EX CREA
TOPICAL_CREAM | Freq: Two times a day (BID) | CUTANEOUS | Status: DC
  Administered 2011-12-26: 1 via TOPICAL
  Filled 2011-12-25: qty 15

## 2011-12-25 MED ORDER — INSULIN ASPART 100 UNIT/ML ~~LOC~~ SOLN: 0.0000 [IU] | Freq: Every day | SUBCUTANEOUS | Status: DC

## 2011-12-25 MED ORDER — ASPIRIN 81 MG PO TBEC: 81.0000 mg | DELAYED_RELEASE_TABLET | Freq: Every day | ORAL | Status: DC

## 2011-12-25 MED ORDER — HEPARIN BOLUS VIA INFUSION
4000.0000 [IU] | Freq: Once | INTRAVENOUS | Status: AC
  Administered 2011-12-25: 4000 [IU] via INTRAVENOUS

## 2011-12-25 MED ORDER — HEPARIN (PORCINE) IN NACL 100-0.45 UNIT/ML-% IJ SOLN
1000.0000 [IU]/h | INTRAMUSCULAR | Status: DC
  Administered 2011-12-25: 1000 [IU]/h via INTRAVENOUS
  Filled 2011-12-25 (×2): qty 250

## 2011-12-25 MED ORDER — SIMVASTATIN 20 MG PO TABS
20.0000 mg | ORAL_TABLET | Freq: Every day | ORAL | Status: DC
  Administered 2011-12-25 – 2011-12-26 (×2): 20 mg via ORAL
  Filled 2011-12-25 (×3): qty 1

## 2011-12-25 MED ORDER — ONDANSETRON HCL 4 MG PO TABS: 4.0000 mg | ORAL_TABLET | Freq: Every day | ORAL | Status: DC | PRN

## 2011-12-25 MED ORDER — NITROGLYCERIN 0.4 MG SL SUBL
0.4000 mg | SUBLINGUAL_TABLET | SUBLINGUAL | Status: DC | PRN
  Administered 2011-12-25: 0.4 mg via SUBLINGUAL
  Filled 2011-12-25: qty 25

## 2011-12-25 MED ORDER — FUROSEMIDE 10 MG/ML IJ SOLN
40.0000 mg | Freq: Once | INTRAMUSCULAR | Status: DC
  Administered 2011-12-25: 40 mg via INTRAVENOUS
  Filled 2011-12-25: qty 4

## 2011-12-25 MED ORDER — SODIUM CHLORIDE 0.9 % IJ SOLN
3.0000 mL | Freq: Two times a day (BID) | INTRAMUSCULAR | Status: DC
  Administered 2011-12-25 – 2011-12-26 (×2): 3 mL via INTRAVENOUS

## 2011-12-25 MED ORDER — ALBUTEROL SULFATE (5 MG/ML) 0.5% IN NEBU
2.5000 mg | INHALATION_SOLUTION | Freq: Four times a day (QID) | RESPIRATORY_TRACT | Status: DC | PRN
  Administered 2011-12-25: 2.5 mg via RESPIRATORY_TRACT
  Filled 2011-12-25 (×2): qty 0.5

## 2011-12-25 MED ORDER — INSULIN ASPART 100 UNIT/ML ~~LOC~~ SOLN
0.0000 [IU] | Freq: Three times a day (TID) | SUBCUTANEOUS | Status: DC
  Administered 2011-12-26: 3 [IU] via SUBCUTANEOUS
  Administered 2011-12-26: 2 [IU] via SUBCUTANEOUS

## 2011-12-25 MED ORDER — ONE-DAILY MULTI VITAMINS PO TABS: 1.0000 | ORAL_TABLET | Freq: Every day | ORAL | Status: DC

## 2011-12-25 MED ORDER — DOCUSATE SODIUM 100 MG PO CAPS
100.0000 mg | ORAL_CAPSULE | Freq: Two times a day (BID) | ORAL | Status: DC
  Administered 2011-12-25 – 2011-12-26 (×3): 100 mg via ORAL
  Filled 2011-12-25 (×6): qty 1

## 2011-12-25 MED ORDER — TIOTROPIUM BROMIDE MONOHYDRATE 18 MCG IN CAPS
18.0000 ug | ORAL_CAPSULE | Freq: Every day | RESPIRATORY_TRACT | Status: DC
  Administered 2011-12-25 – 2011-12-26 (×2): 18 ug via RESPIRATORY_TRACT
  Filled 2011-12-25: qty 5

## 2011-12-25 MED ORDER — ASPIRIN 81 MG PO CHEW
324.0000 mg | CHEWABLE_TABLET | Freq: Once | ORAL | Status: AC
  Administered 2011-12-25: 324 mg via ORAL
  Filled 2011-12-25: qty 4

## 2011-12-25 MED ORDER — PREDNISONE 10 MG PO TABS
10.0000 mg | ORAL_TABLET | Freq: Every day | ORAL | Status: DC
  Administered 2011-12-26: 10 mg via ORAL
  Filled 2011-12-25 (×2): qty 1

## 2011-12-25 MED ORDER — ADULT MULTIVITAMIN W/MINERALS CH
1.0000 | ORAL_TABLET | Freq: Every day | ORAL | Status: DC
  Administered 2011-12-26: 1 via ORAL
  Filled 2011-12-25 (×2): qty 1

## 2011-12-25 MED ORDER — PANTOPRAZOLE SODIUM 40 MG PO TBEC
40.0000 mg | DELAYED_RELEASE_TABLET | Freq: Every day | ORAL | Status: DC
  Administered 2011-12-26: 40 mg via ORAL
  Filled 2011-12-25 (×2): qty 1

## 2011-12-25 MED ORDER — GLIMEPIRIDE 2 MG PO TABS
2.0000 mg | ORAL_TABLET | Freq: Every day | ORAL | Status: DC
  Administered 2011-12-26: 2 mg via ORAL
  Filled 2011-12-25 (×2): qty 1

## 2011-12-25 MED ORDER — POTASSIUM CHLORIDE CRYS ER 10 MEQ PO TBCR
10.0000 meq | EXTENDED_RELEASE_TABLET | Freq: Two times a day (BID) | ORAL | Status: DC
  Administered 2011-12-25 – 2011-12-26 (×3): 10 meq via ORAL
  Filled 2011-12-25 (×6): qty 1

## 2011-12-25 MED ORDER — LORATADINE 10 MG PO TABS
10.0000 mg | ORAL_TABLET | Freq: Every day | ORAL | Status: DC
  Administered 2011-12-25 – 2011-12-26 (×2): 10 mg via ORAL
  Filled 2011-12-25 (×3): qty 1

## 2011-12-25 MED ORDER — ASPIRIN EC 81 MG PO TBEC
81.0000 mg | DELAYED_RELEASE_TABLET | Freq: Every day | ORAL | Status: DC
  Administered 2011-12-26: 81 mg via ORAL
  Filled 2011-12-25 (×3): qty 1

## 2011-12-25 MED ORDER — NEBIVOLOL HCL 10 MG PO TABS
10.0000 mg | ORAL_TABLET | Freq: Every day | ORAL | Status: DC
Start: 2011-12-25 — End: 2011-12-26
  Administered 2011-12-25 – 2011-12-26 (×2): 10 mg via ORAL
  Filled 2011-12-25 (×3): qty 1

## 2011-12-25 NOTE — ED Notes (Signed)
Pt given nitro prn for chest pain 5/10. Pain decreased to 2/10, but BP dropped to 68/48 manually. Pt non-symptomatic. Ray, EDP made aware, no new orders received.

## 2011-12-25 NOTE — Progress Notes (Signed)
Pt's CBG was 66. Orange juice given and pt began eating dinner. After meal was finished CBG rechecked. Came up to 80. Will continue to monitor. Julio Sicks RN

## 2011-12-25 NOTE — ED Notes (Signed)
Pt reports chest pain that began yesterday. Describes as central chest and epigastric pressure. On O2 24/7 for COPD. No worsening sob than usual. C/o dizziness as well. Denies n/v, diaphoresis. Reported "memory loss" yesterday. Could not remember anything yesterday but it all came back to her last night.

## 2011-12-25 NOTE — H&P (Addendum)
PCP:  Oliver Barre, MD, MD   DOA:  12/25/2011  8:24 AM  Chief Complaint:  Chest pain  HPI: 78 year old female with multiple medical history including but not limited to HTN, depression, DM, dyslipidemia who presented with complaints of chest pain, retrosternal, non radiating, started at rest, about 5-6/10 in intensity with associated shortness of breath on exertion. Patient reports no palpitations, no fever or chills, no nausea or vomiting, no abdominal pain. No complaints of lightheadedness or dizziness or loss of consciousness.  Assessment/Plan:  Principal Problem:   * Chest pain - perhaps secondary to NSTEMI in the setting of increased troponin level - cycle cardiac enzymes, 2D ECHO, TSH, lipid panel, A1c - cardiology consult appreciated  Active Problems:   DIABETES MELLITUS, TYPE II - check A1 c level - continue Amaryl - sliding scale insulin - continue CBG monitoring   HYPERTENSION - continue home medication regimen   Acute on chronic Systolic and diastolic CHF - based on 2 D ECHO in 2011 EF 55% - follow up 2 D ECHO on this admission - started lasix 40 mg IV daily - daily weight - strict I&Os - correct electrolytes   COPD - stable - continue spiriva   PEPTIC ULCER DISEASE - continue protonix 40 mg daily   Anemia of chronic kidney failure - hemoglobin stable on admission   CKD (chronic kidney disease), stage IV - baseline creatinine 1.6 - continue to monitor   Mild protein-calorie malnutrition - nutrition consult appreciated  Disposition - to telemetry  DVT Prophylaxis - lovenox sub Q  Code Status - full code  Education  - test results and diagnostic studies were discussed with patient and pt's family  - patient and family have verbalized the understanding - questions were answered at the bedside and contact information was provided for additional questions or concerns     Allergies: Allergies  Allergen Reactions  . Levofloxacin Nausea Only      REACTION: nausea  . Ultram (Tramadol Hcl) Nausea Only    Prior to Admission medications   Medication Sig Start Date End Date Taking? Authorizing Provider  aspirin (ECOTRIN LOW STRENGTH) 81 MG EC tablet Take 81 mg by mouth daily.     Yes Historical Provider, MD  clotrimazole-betamethasone (LOTRISONE) cream   01/09/11  Yes Corwin Levins, MD  fexofenadine (ALLEGRA) 180 MG tablet Take 180 mg by mouth daily.     Yes Historical Provider, MD  furosemide (LASIX) 20 MG tablet 3 tablets by mouth two times a day 06/03/11  Yes Corwin Levins, MD  glimepiride (AMARYL) 2 MG tablet Take 1 tablet (2 mg total) by mouth every morning. 08/08/11 08/07/12 Yes Corwin Levins, MD  lovastatin (MEVACOR) 40 MG tablet Take 1 tablet (40 mg total) by mouth daily. 08/26/11  Yes Corwin Levins, MD  Multiple Vitamin (MULTIVITAMIN) tablet Take 1 tablet by mouth daily.     Yes Historical Provider, MD  nebivolol (BYSTOLIC) 10 MG tablet Take 1 tablet (10 mg total) by mouth daily. 06/03/11  Yes Corwin Levins, MD  omeprazole (PRILOSEC) 20 MG capsule TAKE ONE (1) CAPSULE EACH DAY 09/24/11  Yes Corwin Levins, MD  ondansetron (ZOFRAN) 4 MG tablet Take 1 tablet (4 mg total) by mouth daily as needed for nausea. 08/01/11 07/31/12 Yes Corwin Levins, MD  potassium chloride SA (K-DUR,KLOR-CON) 20 MEQ tablet TAKE 3 TABLETS DAILY 07/24/11  Yes Corwin Levins, MD  predniSONE (DELTASONE) 10 MG tablet TAKE ONE (1) TABLET  EACH DAY 11/03/11  Yes Corwin Levins, MD  SPIRIVA HANDIHALER 18 MCG inhalation capsule INHALE CONTENTS OF ONE CAPSULE IN       HANDIHALER ONCE DAILY 12/23/11  Yes Corwin Levins, MD    Past Medical History  Diagnosis Date  . GERD 06/03/2007  . HYPERTENSION 06/03/2007  . OSTEOARTHRITIS 06/03/2007  . PEPTIC ULCER DISEASE 06/03/2007  . TRANSIENT ISCHEMIC ATTACK, HX OF 06/03/2007  . CONGESTIVE HEART FAILURE 06/03/2007    -2d echo 03/19/07 mild diastolic dysfunction, nl LA, RV  . DIABETES MELLITUS, TYPE II 06/03/2007  . LOW BACK PAIN 06/03/2007  . COPD  06/03/2007    Dr Ocie Doyne  . DEPRESSION 06/03/2007  . 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 Atrial fibrillation 08/10/2007  . HEMORRHOIDS 08/08/2009  . CONSTIPATION, INTERMITTENT 02/15/2010  . DYSPNEA/SHORTNESS OF BREATH 06/09/2008  . INTERSTITIAL LUNG DISEASE 08/10/2007    -onset 12/03 with definite steroid responsive component -PFT's October 11, 2009: VC 67%, no airflow obstruction DLC0 14%  . Allergic rhinitis   . Diverticulosis 05/11/2011  . Goiter 05/11/2011  . Shingles 05/11/2011    History reviewed. No pertinent past surgical history.  Social History:  reports that she has never smoked. She does not have any smokeless tobacco history on file. She reports that she does not drink alcohol or use illicit drugs.  Family History  Problem Relation Age of Onset  . Hypertension Mother   . Hypertension Father   . Cancer Sister     Breast Cancer, Liver Cancer    Review of Systems:  Constitutional: Denies fever, chills, diaphoresis, appetite change and fatigue.  HEENT: Denies photophobia, eye pain, redness, hearing loss, ear pain, congestion, sore throat, rhinorrhea, sneezing, mouth sores, trouble swallowing, neck pain, neck stiffness and tinnitus.   Respiratory: Denies SOB, DOE, cough, chest tightness,  and wheezing.   Cardiovascular: as per HPI Gastrointestinal: Denies nausea, vomiting, abdominal pain, diarrhea, constipation, blood in stool and abdominal distention.  Genitourinary: Denies dysuria, urgency, frequency, hematuria, flank pain and difficulty urinating.  Musculoskeletal: Denies myalgias, back pain, joint swelling, arthralgias and gait problem.  Skin: Denies pallor, rash and wound.  Neurological: Denies dizziness, seizures, syncope, weakness, light-headedness, numbness and headaches.  Hematological: Denies adenopathy. Easy bruising, personal or family bleeding history  Psychiatric/Behavioral: Denies suicidal ideation, mood changes, confusion, nervousness, sleep  disturbance and agitation   Physical Exam:  Filed Vitals:   12/25/11 0829 12/25/11 0937 12/25/11 0939 12/25/11 0944  BP: 137/70 74/45 64/47  68/48  Pulse: 81 73 73   Temp: 98.8 F (37.1 C)     TempSrc: Oral     Resp: 18 19 18    SpO2: 100% 100% 100%     Constitutional: Vital signs reviewed.  Patient is in no acute distress and cooperative with exam. Alert and oriented x3.  Head: Normocephalic and atraumatic Ear: TM normal bilaterally Mouth: no erythema or exudates, MMM Eyes: PERRL, EOMI, conjunctivae normal, No scleral icterus.  Neck: Supple, Trachea midline normal ROM, No JVD, mass, thyromegaly, or carotid bruit present.  Cardiovascular: RRR, S1 normal, S2 normal, no MRG, pulses symmetric and intact bilaterally Pulmonary/Chest: CTAB, no wheezes, rales, or rhonchi Abdominal: Soft. Non-tender, non-distended, bowel sounds are normal, no masses, organomegaly, or guarding present.  GU: no CVA tenderness Musculoskeletal: No joint deformities, erythema, or stiffness, ROM full and no nontender Ext: no edema and no cyanosis, pulses palpable bilaterally (DP and PT) Hematology: no cervical, inginal, or axillary adenopathy.  Neurological: A&O x3, Strenght is normal and  symmetric bilaterally, cranial nerve II-XII are grossly intact, no focal motor deficit, sensory intact to light touch bilaterally.  Skin: Warm, dry and intact. No rash, cyanosis, or clubbing.  Psychiatric: Normal mood and affect. speech and behavior is normal. Judgment and thought content normal. Cognition and memory are normal.   Labs on Admission:  Results for orders placed during the hospital encounter of 12/25/11 (from the past 48 hour(s))  GLUCOSE, CAPILLARY     Status: Abnormal   Collection Time   12/25/11  8:33 AM      Component Value Range Comment   Glucose-Capillary 116 (*) 70 - 99 (mg/dL)    Comment 1 Notify RN     URINALYSIS, ROUTINE W REFLEX MICROSCOPIC     Status: Abnormal   Collection Time   12/25/11  9:21 AM       Component Value Range Comment   Color, Urine YELLOW  YELLOW     APPearance CLOUDY (*) CLEAR     Specific Gravity, Urine 1.017  1.005 - 1.030     pH 5.0  5.0 - 8.0     Glucose, UA NEGATIVE  NEGATIVE (mg/dL)    Hgb urine dipstick TRACE (*) NEGATIVE     Bilirubin Urine NEGATIVE  NEGATIVE     Ketones, ur NEGATIVE  NEGATIVE (mg/dL)    Protein, ur 30 (*) NEGATIVE (mg/dL)    Urobilinogen, UA 0.2  0.0 - 1.0 (mg/dL)    Nitrite NEGATIVE  NEGATIVE     Leukocytes, UA NEGATIVE  NEGATIVE    URINE MICROSCOPIC-ADD ON     Status: Abnormal   Collection Time   12/25/11  9:21 AM      Component Value Range Comment   Squamous Epithelial / LPF MANY (*) RARE     WBC, UA 3-6  <3 (WBC/hpf)    RBC / HPF 3-6  <3 (RBC/hpf)    Bacteria, UA MANY (*) RARE     Urine-Other MUCOUS PRESENT     PRO B NATRIURETIC PEPTIDE     Status: Abnormal   Collection Time   12/25/11  9:30 AM      Component Value Range Comment   Pro B Natriuretic peptide (BNP) 1132.0 (*) 0 - 450 (pg/mL)   CBC     Status: Abnormal   Collection Time   12/25/11  9:30 AM      Component Value Range Comment   WBC 7.8  4.0 - 10.5 (K/uL)    RBC 3.85 (*) 3.87 - 5.11 (MIL/uL)    Hemoglobin 11.1 (*) 12.0 - 15.0 (g/dL)    HCT 56.2 (*) 13.0 - 46.0 (%)    MCV 90.1  78.0 - 100.0 (fL)    MCH 28.8  26.0 - 34.0 (pg)    MCHC 32.0  30.0 - 36.0 (g/dL)    RDW 86.5  78.4 - 69.6 (%)    Platelets 180  150 - 400 (K/uL)   COMPREHENSIVE METABOLIC PANEL     Status: Abnormal   Collection Time   12/25/11  9:30 AM      Component Value Range Comment   Sodium 143  135 - 145 (mEq/L)    Potassium 3.6  3.5 - 5.1 (mEq/L)    Chloride 99  96 - 112 (mEq/L)    CO2 36 (*) 19 - 32 (mEq/L)    Glucose, Bld 92  70 - 99 (mg/dL)    BUN 27 (*) 6 - 23 (mg/dL)    Creatinine, Ser 2.95 (*) 0.50 - 1.10 (mg/dL)  Calcium 9.4  8.4 - 10.5 (mg/dL)    Total Protein 7.0  6.0 - 8.3 (g/dL)    Albumin 3.4 (*) 3.5 - 5.2 (g/dL)    AST 23  0 - 37 (U/L)    ALT 14  0 - 35 (U/L)    Alkaline  Phosphatase 63  39 - 117 (U/L)    Total Bilirubin 0.4  0.3 - 1.2 (mg/dL)    GFR calc non Af Amer 28 (*) >90 (mL/min)    GFR calc Af Amer 33 (*) >90 (mL/min)   POCT I-STAT TROPONIN I     Status: Abnormal   Collection Time   12/25/11  9:43 AM      Component Value Range Comment   Troponin i, poc 0.11 (*) 0.00 - 0.08 (ng/mL)    Comment NOTIFIED PHYSICIAN      Comment 3              Time Spent on Admission: Over 30 minutes  Talis Iwan 12/25/2011, 10:49 AM  [facility] Pager # [phone] Main Office # [phone]

## 2011-12-25 NOTE — Progress Notes (Signed)
  Echocardiogram 2D Echocardiogram has been performed.  [name] [facility] 12/25/2011, 2:54 PM

## 2011-12-25 NOTE — ED Provider Notes (Signed)
History     CSN: [id number]  Arrival date & time 12/25/11  0809   First MD Initiated Contact with Patient 12/25/11 0825      Chief Complaint  Patient presents with  . Chest Pain    (Consider location/radiation/quality/duration/timing/severity/associated sxs/prior treatment) HPI Patient complaining of substernal chest pressure that began yesterday. It waxes and wanes coming on for about 5 minutes at a time. It is pressure in nature. It does not radiate. She has not get nausea vomiting or diaphoresis. She has had some increase in her shortness of breath and feels like this is her congestive heart failure worsening. Past Medical History  Diagnosis Date  . GERD 06/03/2007  . HYPERTENSION 06/03/2007  . OSTEOARTHRITIS 06/03/2007  . PEPTIC ULCER DISEASE 06/03/2007  . TRANSIENT ISCHEMIC ATTACK, HX OF 06/03/2007  . CONGESTIVE HEART FAILURE 06/03/2007    -2d echo 03/19/07 mild diastolic dysfunction, nl LA, RV  . DIABETES MELLITUS, TYPE II 06/03/2007  . LOW BACK PAIN 06/03/2007  . COPD 06/03/2007    Dr Ocie Doyne  . DEPRESSION 06/03/2007  . 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 Atrial fibrillation 08/10/2007  . HEMORRHOIDS 08/08/2009  . CONSTIPATION, INTERMITTENT 02/15/2010  . DYSPNEA/SHORTNESS OF BREATH 06/09/2008  . INTERSTITIAL LUNG DISEASE 08/10/2007    -onset 12/03 with definite steroid responsive component -PFT's October 11, 2009: VC 67%, no airflow obstruction DLC0 14%  . Allergic rhinitis   . Diverticulosis 05/11/2011  . Goiter 05/11/2011  . Shingles 05/11/2011    History reviewed. No pertinent past surgical history.  Family History  Problem Relation Age of Onset  . Hypertension Mother   . Hypertension Father   . Cancer Sister     Breast Cancer, Liver Cancer    History  Substance Use Topics  . Smoking status: Never Smoker   . Smokeless tobacco: Not on file  . Alcohol Use: No    OB History    Grav Para Term Preterm Abortions TAB SAB Ect Mult Living                    Review of Systems  Constitutional: Negative.        Morbidly obese female sitting on the bed is not appear to be in any acute distress.  All other systems reviewed and are negative.    Allergies  Levofloxacin and Ultram  Home Medications   Current Outpatient Rx  Name Route Sig Dispense Refill  . ASPIRIN 81 MG PO TBEC Oral Take 81 mg by mouth daily.      Marland Kitchen CLOTRIMAZOLE-BETAMETHASONE 1-0.05 % EX CREA       . FEXOFENADINE HCL 180 MG PO TABS Oral Take 180 mg by mouth daily.      . FUROSEMIDE 20 MG PO TABS  3 tablets by mouth two times a day 180 tablet 11  . GLIMEPIRIDE 2 MG PO TABS Oral Take 1 tablet (2 mg total) by mouth every morning. 30 tablet 11  . LOVASTATIN 40 MG PO TABS Oral Take 1 tablet (40 mg total) by mouth daily. 30 tablet 11  . ONE-DAILY MULTI VITAMINS PO TABS Oral Take 1 tablet by mouth daily.      . NEBIVOLOL HCL 10 MG PO TABS Oral Take 1 tablet (10 mg total) by mouth daily. 30 tablet 11  . OMEPRAZOLE 20 MG PO CPDR  TAKE ONE (1) CAPSULE EACH DAY 30 capsule 6  . ONDANSETRON HCL 4 MG PO TABS Oral Take 1 tablet (4 mg total)  by mouth daily as needed for nausea. 30 tablet 1  . POTASSIUM CHLORIDE CRYS ER 20 MEQ PO TBCR  TAKE 3 TABLETS DAILY 90 tablet 5  . PREDNISONE 10 MG PO TABS  TAKE ONE (1) TABLET EACH DAY 30 tablet 5  . SPIRIVA HANDIHALER 18 MCG IN CAPS  INHALE CONTENTS OF ONE CAPSULE IN       HANDIHALER ONCE DAILY 30 each 3    BP 137/70  Pulse 81  Temp(Src) 98.8 F (37.1 C) (Oral)  Resp 18  SpO2 100%  Physical Exam  Nursing note and vitals reviewed. Constitutional: She appears well-developed and well-nourished.       Morbidly obese female sitting on the bed is not appear to be in any acute distress.  HENT:  Head: Normocephalic and atraumatic.  Eyes: Conjunctivae and EOM are normal. Pupils are equal, round, and reactive to light.  Neck: Normal range of motion. Neck supple.  Cardiovascular: Normal rate, regular rhythm, normal heart sounds and intact distal  pulses.   Pulmonary/Chest: Effort normal.       Breath sounds decreased with some crackles at bilateral bases.  Abdominal: Soft. Bowel sounds are normal.  Musculoskeletal: Normal range of motion.  Neurological: She is alert.  Skin: Skin is warm and dry.       Chronic skin changes seen in bilateral lower extremities no acute edema is noted.  Psychiatric: She has a normal mood and affect. Thought content normal.    ED Course  Procedures (including critical care time)   Labs Reviewed  GLUCOSE, CAPILLARY   No results found.   No diagnosis found.    Date: 12/25/2011  Rate: 81  Rhythm: normal sinus rhythm  QRS Axis: left  Intervals: normal  ST/T Wave abnormalities: nonspecific ST changes  Conduction Disutrbances:none  Narrative Interpretation:   Old EKG Reviewed: unchanged   MDM  Patient with episode of chest pain here resolved with nitro.  Aspirin given.  Patient with last cardiac evaluation in during hospitalization 7/11 for chest pain at which time chest pain thougth to be secondary to pneumonia.  Patient with elevated bnp with markings increased on cxr.  Patient with elevated troponin at .11.  Patient does not know cardiologist but appears to have been evaluated by [facility] in previous consults.    The patient's care discussed with Dr. Elisabeth Pigeon. Patient will be heparinized. I have also consulted lower cardiology to evaluate the patient. She is currently pain-free  and her blood pressure is systolically 100. Blood pressure decreased after sublingual nitroglycerin.        Hilario Quarry, MD 12/25/11 1149

## 2011-12-25 NOTE — ED Notes (Signed)
Results of troponin given to Dr Rosalia Hammers

## 2011-12-25 NOTE — ED Notes (Signed)
Pt sts she has had this kind of pain before when "I have too much fluid around my heart." Pt has Hx of CHF. Sts she has been taking her Lasix and has been having "a lot' of urine output. Pt placed on 3L O2 upon entrance to exam room, which is what she is on at home. Pt speaking in complete sentences.

## 2011-12-25 NOTE — ED Notes (Signed)
Echo US at bedside

## 2011-12-25 NOTE — Consult Note (Addendum)
CARDIOLOGY CONSULT NOTE  Patient ID: [name], MRN: [id number], DOB/AGE: 02/23/1934 78 y.o. Admit date: 12/25/2011 Date of Consult: 12/25/2011  Primary Physician: Oliver Barre, MD, MD Primary Cardiologist: New  Chief Complaint: sob and chest pain Reason for Consultation: chest pain, elevated troponin  HPI: 78 y.o. female w/ PMHx significant for Obesity, HTN, Pulm Fibrosis, Chronic diastolic CHF, no known CAD who presented to [facility] on 12/25/2011 with complaints of sob and chest pain.  She reports having progressively worsening sob over the last month. She was waiting to be seen by her PCP in May, but developed dizziness, memory loss, and chest pain yesterday. She states she had a "dizzy spell and lost my mind" while at home yesterday. She rested in bed and around 12:00 had onset of substernal chest pressure, 8/10, without radiation, that lasted about 1 hr. It was intermittent throughout the night with some associated sob, no nausea or diaphoresis. She reports having to sit on the side of the bed to breath at night. She presented to the ED today where her chest pain was relieved after 1 SL NTG. Denies weight changes (doesn't weigh herself), worsening of LE edema, syncope, focal neurologic deficits, recent illness, fever, chills, change in bladder or bowel habits.  In the ED, EKG shows NSR 81bpm, nonspecific ST changes compared to old EKG, initial poc troponin is mildly elevated at 0.11, CXR is without acute cardiopulmonary findings. She is chest pain free.  Past Medical History  Diagnosis Date  . GERD 06/03/2007  . HYPERTENSION 06/03/2007  . OSTEOARTHRITIS 06/03/2007  . PEPTIC ULCER DISEASE 06/03/2007  . TRANSIENT ISCHEMIC ATTACK, HX OF 06/03/2007  . CONGESTIVE HEART FAILURE 06/03/2007    -2d echo 03/19/07 mild diastolic dysfunction, nl LA, RV  . DIABETES MELLITUS, TYPE II 06/03/2007  . LOW BACK PAIN 06/03/2007  . COPD 06/03/2007    Dr Ocie Doyne  . DEPRESSION 06/03/2007  . Benign  carcinoid tumor of the stomach 08/10/2007  . Atrial fibrillation 08/10/2007  . HEMORRHOIDS 08/08/2009  . CONSTIPATION, INTERMITTENT 02/15/2010  . DYSPNEA/SHORTNESS OF BREATH 06/09/2008  . INTERSTITIAL LUNG DISEASE 08/10/2007    -onset 12/03 with definite steroid responsive component -PFT's October 11, 2009: VC 67%, no airflow obstruction DLC0 14%  . Allergic rhinitis   . Diverticulosis 05/11/2011  . Goiter 05/11/2011  . Shingles 05/11/2011      Surgical History:  Past Surgical History  Procedure Date  . Stomach tumors removed 08/22/1991  . Abdominal hysterectomy 10/31/1983    TAH     Home Meds: Medication Sig  aspirin (ECOTRIN LOW STRENGTH) 81 MG EC tableTake 81 mg by mouth daily.  t   clotrimazole-betamethasone (LOTRISONE) cream    fexofenadine (ALLEGRA) 180 MG tablet Take 180 mg by mouth daily.    furosemide (LASIX) 20 MG tablet 3 tablets by mouth two times a day  glimepiride (AMARYL) 2 MG tablet Take 1 tablet (2 mg total) by mouth every morning.  lovastatin (MEVACOR) 40 MG tablet Take 1 tablet (40 mg total) by mouth daily.  Multiple Vitamin (MULTIVITAMIN) tablet Take 1 tablet by mouth daily.    nebivolol (BYSTOLIC) 10 MG tablet Take 1 tablet (10 mg total) by mouth daily.  omeprazole (PRILOSEC) 20 MG capsule TAKE ONE (1) CAPSULE EACH DAY  ondansetron (ZOFRAN) 4 MG tablet Take 1 tablet (4 mg total) by mouth daily as needed for nausea.  potassium chloride SA (K-DUR,KLOR-CON) 20 MEQ tablet TAKE 3 TABLETS DAILY  predniSONE (DELTASONE) 10 MG tablet TAKE ONE (  1) TABLET EACH DAY  SPIRIVA HANDIHALER 18 MCG inhalation capsule INHALE CONTENTS OF ONE CAPSULE IN       HANDIHALER ONCE DAILY    Inpatient Medications:    . aspirin  324 mg Oral Once  . furosemide  40 mg Intravenous Once      Allergies:  Allergies  Allergen Reactions  . Levofloxacin Nausea Only    REACTION: nausea  . Ultram (Tramadol Hcl) Nausea Only    History   Social History  . Marital Status: Widowed    Spouse  Name: N/A    Number of Children: N/A  . Years of Education: N/A   Occupational History  . Retired    Social History Main Topics  . Smoking status: Never Smoker   . Smokeless tobacco: Never Used  . Alcohol Use: No  . Drug Use: No  . Sexually Active: No   Other Topics Concern  . Not on file   Social History Narrative   ChildrenLives with her daughter     Family History  Problem Relation Age of Onset  . Hypertension Mother   . Hypertension Father   . Cancer Sister     Breast Cancer, Liver Cancer     Review of Systems: General: negative for chills, fever, night sweats or weight changes.  Cardiovascular: See HPI Dermatological: negative for rash Respiratory: negative for cough or wheezing Urologic: negative for hematuria Abdominal: negative for nausea, vomiting, diarrhea, bright red blood per rectum, melena, or hematemesis Neurologic:(+) dizziness negative for visual changes, syncope All other systems reviewed and are otherwise negative except as noted above.  Labs:  Component Value Date   WBC 7.8 12/25/2011   HGB 11.1* 12/25/2011   HCT 34.7* 12/25/2011   MCV 90.1 12/25/2011   PLT 180 12/25/2011    Lab 12/25/11 0930  NA 143  K 3.6  CL 99  CO2 36*  BUN 27*  CREATININE 1.70*  CALCIUM 9.4  PROT 7.0  BILITOT 0.4  ALKPHOS 63  ALT 14  AST 23  GLUCOSE 92     12/25/2011 09:30  Pro B Natriuretic peptide (BNP) 1132.0 (H)     12/25/2011 09:43  Troponin i, poc 0.11 (HH)   Radiology/Studies:   12/25/2011 - CXR Findings: Heart and mediastinal contours are unchanged with ectasia of the thoracic aorta correlating with the given history of hypertension.  Coarse interstitial markings with scarring in the left midlung zone and lung bases compatible with known underlying pulmonary fibrosis. No new areas of focal atelectasis or infiltrate are seen to suggest superimposed pneumonia on these chronic changes.  No pleural fluid or interstitial septal lines or fissural prominence is seen to  suggest changes of early interstitial edema.  Bony structures demonstrate bilateral degenerative changes of the shoulders and osteophytosis of the thoracic spine.  IMPRESSION: Stable pulmonary fibrosis changes with no definite acute or new focal abnormality suggested.    EKG: 12/25/11 @ 0834 - NSR 81bpm, nonspecific ST changes compared to old EKG  Physical Exam: Blood pressure 68/48, pulse 73, temperature 98.8 F (37.1 C), temperature source Oral, resp. rate 18, SpO2 100.00%. General: Overweight black female in no acute distress. Head: Normocephalic, atraumatic, sclera non-icteric, no xanthomas, nares are without discharge.  Neck: Supple. Negative for carotid bruits. JVD not elevated. Lungs: Fine bibasilar rales; No wheezes or rhonchi. Breathing is unlabored. Heart: RRR with S1 S2. No murmurs, rubs, or gallops appreciated. Abdomen: Obese, Soft, non-tender, non-distended with normoactive bowel sounds. No hepatomegaly. No rebound/guarding. No obvious abdominal  masses. Msk:  Strength and tone appear normal for age. Extremities: No clubbing or cyanosis. 1+ BLE edema.  Distal pedal pulses are 2+ and equal bilaterally. Neuro: Alert and oriented X 3. Moves all extremities spontaneously. Psych:  Responds to questions appropriately with a normal affect.   Assessment and Plan:   See MD note for full assessment and plan  Signed, HOPE, JESSICA PA-C 12/25/2011, 11:32 AM   Patient seen, examined. Available data reviewed. Agree with findings, assessment, and plan as outlined by [facility], PA-C. The patient was independently interviewed and examined.   ASSESSMENT: 1. Acute on chronic respiratory failure - I suspect this is multifactorial in a patient with chronic interstitial lung disease on home O2. I can't find formal PFT's but per Dr Raphael Gibney note her DLCO is 14%!  2. Acute on chronic diastolic heart failure. This patient has an elevated BNP and symptoms typical of heart failure with orthopnea, PND,  and edema. Recommend repeat 2D Echo to rule out systolic dysfunction. Diurese with IV lasix - would start with 40 mg IV Q 8 hrs. Follow weights, I/O's, etc. Will follow with you. 3. Troponinemia - Mild. Doubt ACS. No significant EKG changes. Check serial enzymes. No IV heparin for now unless significant enzyme rise. Give ASA.  Tonny Bollman, M.D. 12/25/2011 12:16 PM

## 2011-12-25 NOTE — ED Notes (Signed)
Cardiology PA at bedside. Pt denies pain at this time. VSS. Awaiting bed assignment.

## 2011-12-25 NOTE — Progress Notes (Signed)
ANTICOAGULATION CONSULT NOTE - Initial Consult  Pharmacy Consult for Heparin Indication: chest pain/ACS  Allergies  Allergen Reactions  . Levofloxacin Nausea Only    REACTION: nausea  . Ultram (Tramadol Hcl) Nausea Only    Patient Measurements: Per patient report: Ht: 5'6" Actual weight: 105kg Ideal body weight: 57kg Adjusted weight for heparin dosing: 76kg  Vital Signs: Temp: 99 F (37.2 C) (04/04 1139) Temp src: Oral (04/04 0829) BP: 107/56 mmHg (04/04 1330) Pulse Rate: 72  (04/04 1330)  Labs:  Basename 12/25/11 1247 12/25/11 0930  HGB -- 11.1*  HCT -- 34.7*  PLT -- 180  APTT -- 31  LABPROT -- 13.7  INR -- 1.03  HEPARINUNFRC -- --  CREATININE -- 1.70*  CKTOTAL 65 --  CKMB 1.5 --  TROPONINI <0.30 --   The CrCl is unknown because both a height and weight (above a minimum accepted value) are required for this calculation.  Medical History: Past Medical History  Diagnosis Date  . GERD 06/03/2007  . HYPERTENSION 06/03/2007  . OSTEOARTHRITIS 06/03/2007  . PEPTIC ULCER DISEASE 06/03/2007  . TRANSIENT ISCHEMIC ATTACK, HX OF 06/03/2007  . CONGESTIVE HEART FAILURE 06/03/2007    -2d echo 03/19/07 mild diastolic dysfunction, nl LA, RV  . DIABETES MELLITUS, TYPE II 06/03/2007  . LOW BACK PAIN 06/03/2007  . COPD 06/03/2007    Dr Ocie Doyne  . DEPRESSION 06/03/2007  . 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 Atrial fibrillation 08/10/2007  . HEMORRHOIDS 08/08/2009  . CONSTIPATION, INTERMITTENT 02/15/2010  . DYSPNEA/SHORTNESS OF BREATH 06/09/2008  . INTERSTITIAL LUNG DISEASE 08/10/2007    -onset 12/03 with definite steroid responsive component -PFT's October 11, 2009: VC 67%, no airflow obstruction DLC0 14%  . Allergic rhinitis   . Diverticulosis 05/11/2011  . Goiter 05/11/2011  . Shingles 05/11/2011    Medications:  Scheduled:    . aspirin  324 mg Oral Once  . aspirin EC  81 mg Oral Daily  . clotrimazole   Topical BID  . docusate sodium  100 mg Oral BID  .  furosemide  40 mg Intravenous Daily  . glimepiride  2 mg Oral QAC breakfast  . loratadine  10 mg Oral Daily  . mulitivitamin with minerals  1 tablet Oral Daily  . nebivolol  10 mg Oral Daily  . pantoprazole  40 mg Oral Q1200  . potassium chloride SA  10 mEq Oral BID  . predniSONE  10 mg Oral Q breakfast  . simvastatin  20 mg Oral q1800  . sodium chloride  3 mL Intravenous Q12H  . tiotropium  18 mcg Inhalation Daily  . DISCONTD: aspirin  81 mg Oral Daily  . DISCONTD: furosemide  40 mg Intravenous Once  . DISCONTD: multivitamin  1 tablet Oral Daily    Assessment:  78 yo female presents to ED with shortness of breath & chest pain  Beginning heparin per pharmacy for rule out ACS   Goal of Therapy:  Heparin level 0.3-0.7 units/ml   Plan:   Heparin 4000 units IV bolus x 1, then  Heparin 1000 units/hr infusion  Check heparin level in 8hrs due to elevated Scr  Check heparin level & CBC daily   Loralee Pacas, PharmD, BCPS Pager: [phone] 12/25/2011,2:32 PM

## 2011-12-26 LAB — PROTIME-INR
INR: 1.09 (ref 0.00–1.49)
Prothrombin Time: 14.3 seconds (ref 11.6–15.2)

## 2011-12-26 LAB — CBC
HCT: 30.2 % — ABNORMAL LOW (ref 36.0–46.0)
Hemoglobin: 9.6 g/dL — ABNORMAL LOW (ref 12.0–15.0)
RDW: 13.7 % (ref 11.5–15.5)
WBC: 7.5 10*3/uL (ref 4.0–10.5)

## 2011-12-26 LAB — COMPREHENSIVE METABOLIC PANEL
Alkaline Phosphatase: 55 U/L (ref 39–117)
BUN: 26 mg/dL — ABNORMAL HIGH (ref 6–23)
Calcium: 9 mg/dL (ref 8.4–10.5)
Creatinine, Ser: 1.85 mg/dL — ABNORMAL HIGH (ref 0.50–1.10)
GFR calc Af Amer: 30 mL/min — ABNORMAL LOW (ref >90)
Glucose, Bld: 83 mg/dL (ref 70–99)
Total Protein: 6.1 g/dL (ref 6.0–8.3)

## 2011-12-26 LAB — CARDIAC PANEL(CRET KIN+CKTOT+MB+TROPI)
CK, MB: 1.6 ng/mL (ref 0.3–4.0)
Total CK: 66 U/L (ref 7–177)

## 2011-12-26 LAB — LIPID PANEL
Cholesterol: 181 mg/dL (ref 0–200)
HDL: 89 mg/dL (ref >39)
Total CHOL/HDL Ratio: 2 RATIO
Triglycerides: 77 mg/dL (ref <150)

## 2011-12-26 LAB — GLUCOSE, CAPILLARY: Glucose-Capillary: 177 mg/dL — ABNORMAL HIGH (ref 70–99)

## 2011-12-26 LAB — HEPARIN LEVEL (UNFRACTIONATED)
Heparin Unfractionated: 0.96 IU/mL — ABNORMAL HIGH (ref 0.30–0.70)
Heparin Unfractionated: 0.98 IU/mL — ABNORMAL HIGH (ref 0.30–0.70)

## 2011-12-26 MED ORDER — HEPARIN (PORCINE) IN NACL 100-0.45 UNIT/ML-% IJ SOLN
850.0000 [IU]/h | INTRAMUSCULAR | Status: DC
  Administered 2011-12-26: 850 [IU]/h via INTRAVENOUS
  Filled 2011-12-26 (×2): qty 250

## 2011-12-26 MED ORDER — HYDROCODONE-ACETAMINOPHEN 5-325 MG PO TABS
1.0000 | ORAL_TABLET | Freq: Once | ORAL | Status: AC
  Administered 2011-12-26: 1 via ORAL
  Filled 2011-12-26: qty 1

## 2011-12-26 MED ORDER — HEPARIN SODIUM (PORCINE) 5000 UNIT/ML IJ SOLN
5000.0000 [IU] | Freq: Three times a day (TID) | INTRAMUSCULAR | Status: DC
  Filled 2011-12-26 (×5): qty 1

## 2011-12-26 NOTE — Discharge Instructions (Addendum)
Chest Pain (Nonspecific) It is often hard to give a specific diagnosis for the cause of chest pain. There is always a chance that your pain could be related to something serious, such as a heart attack or a blood clot in the lungs. You need to follow up with your caregiver for further evaluation. CAUSES   Heartburn.   Pneumonia or bronchitis.   Anxiety or stress.   Inflammation around your heart (pericarditis) or lung (pleuritis or pleurisy).   A blood clot in the lung.   A collapsed lung (pneumothorax). It can develop suddenly on its own (spontaneous pneumothorax) or from injury (trauma) to the chest.   Shingles infection (herpes zoster virus).  The chest wall is composed of bones, muscles, and cartilage. Any of these can be the source of the pain.  The bones can be bruised by injury.   The muscles or cartilage can be strained by coughing or overwork.   The cartilage can be affected by inflammation and become sore (costochondritis).  DIAGNOSIS  Lab tests or other studies, such as X-rays, electrocardiography, stress testing, or cardiac imaging, may be needed to find the cause of your pain.  TREATMENT   Treatment depends on what may be causing your chest pain. Treatment may include:   Acid blockers for heartburn.   Anti-inflammatory medicine.   Pain medicine for inflammatory conditions.   Antibiotics if an infection is present.   You may be advised to change lifestyle habits. This includes stopping smoking and avoiding alcohol, caffeine, and chocolate.   You may be advised to keep your head raised (elevated) when sleeping. This reduces the chance of acid going backward from your stomach into your esophagus.   Most of the time, nonspecific chest pain will improve within 2 to 3 days with rest and mild pain medicine.  HOME CARE INSTRUCTIONS   If antibiotics were prescribed, take your antibiotics as directed. Finish them even if you start to feel better.   For the next few  days, avoid physical activities that bring on chest pain. Continue physical activities as directed.   Do not smoke.   Avoid drinking alcohol.   Only take over-the-counter or prescription medicine for pain, discomfort, or fever as directed by your caregiver.   Follow your caregiver's suggestions for further testing if your chest pain does not go away.   Keep any follow-up appointments you made. If you do not go to an appointment, you could develop lasting (chronic) problems with pain. If there is any problem keeping an appointment, you must call to reschedule.  SEEK MEDICAL CARE IF:   You think you are having problems from the medicine you are taking. Read your medicine instructions carefully.   Your chest pain does not go away, even after treatment.   You develop a rash with blisters on your chest.  SEEK IMMEDIATE MEDICAL CARE IF:   You have increased chest pain or pain that spreads to your arm, neck, jaw, back, or abdomen.   You develop shortness of breath, an increasing cough, or you are coughing up blood.   You have severe back or abdominal pain, feel nauseous, or vomit.   You develop severe weakness, fainting, or chills.   You have a fever.  THIS IS AN EMERGENCY. Do not wait to see if the pain will go away. Get medical help at once. Call your local emergency services (911 in [place] Do not drive yourself to the hospital. MAKE SURE YOU:   Understand these instructions.     Will watch your condition.   Will get help right away if you are not doing well or get worse.  Document Released: 06/18/2005 Document Revised: 08/28/2011 Document Reviewed: 04/13/2008 Exit[employer] Patient Information 2012 ExitCare, LLCAcute Coronary Syndrome Acute coronary syndrome (ACS) is an urgent problem in which the blood and oxygen supply to the heart is critically deficient. ACS requires hospitalization because one or more coronary arteries may be blocked. ACS represents a range of conditions  including:  Previous angina that is now unstable, lasts longer, happens at rest, or is more intense.   A heart attack, with heart muscle cell injury and death.  There are three vital coronary arteries that supply the heart muscle with blood and oxygen so that it can pump blood effectively. If blockages to these arteries develop, blood flow to the heart muscle is reduced. If the heart does not get enough blood, angina may occur as the first warning sign. SYMPTOMS   The most common signs of angina include:   Tightness or squeezing in the chest.   Feeling of heaviness on the chest.   Discomfort in the arms, neck, or jaw.   Shortness of breath and nausea.   Cold, wet skin.   Angina is usually brought on by physical effort or excitement which increase the oxygen needs of the heart. These states increase the blood flow needs of the heart beyond what can be delivered.  TREATMENT   Medicines to help discomfort may include nitroglycerin (nitro) in the form of tablets or a spray for rapid relief, or longer-acting forms such as cream, patches, or capsules. (Be aware that there are many side effects and possible interactions with other drugs).   Other medicines may be used to help the heart pump better.   Procedures to open blocked arteries including angioplasty or stent placement to keep the arteries open.   Open heart surgery may be needed when there are many blockages or they are in critical locations that are best treated with surgery.  HOME CARE INSTRUCTIONS   Avoid smoking.   Take one baby or adult aspirin daily, if your caregiver advises. This helps reduce the risk of a heart attack.   It is very important that you follow the angina treatment prescribed by your caregiver. Make arrangements for proper follow-up care.   Eat a heart healthy diet with salt and fat restrictions as advised.   Regular exercise is good for you as long as it does not cause discomfort. Do not begin any new  type of exercise until you check with your caregiver.   If you are overweight, you should lose weight.   Try to maintain normal blood lipid levels.   Keep your blood pressure under control as recommended by your caregiver.   You should tell your caregiver right away about any increase in the severity or frequency of your chest discomfort or angina attacks. When you have angina, you should stop what you are doing and sit down. This may bring relief in 3 to 5 minutes. If your caregiver has prescribed nitro, take it as directed.   If your caregiver has given you a follow-up appointment, it is very important to keep that appointment. Not keeping the appointment could result in a chronic or permanent injury, pain, and disability. If there is any problem keeping the appointment, you must call back to this facility for assistance.  SEEK IMMEDIATE MEDICAL CARE IF:   You develop nausea, vomiting, or shortness of breath.   You feel  faint, lightheaded, or pass out.   Your chest discomfort gets worse.   You are sweating or experience sudden profound fatigue.   You do not get relief of your chest pain after 3 doses of nitro.   Your discomfort lasts longer than 15 minutes.  MAKE SURE YOU:   Understand these instructions.   Will watch your condition.   Will get help right away if you are not doing well or get worse.  Document Released: 09/08/2005 Document Revised: 08/28/2011 Document Reviewed: 04/11/2008 [facility] Patient Information 2012 [place], [place].Anemia, Frequently Asked Questions WHAT ARE THE SYMPTOMS OF ANEMIA?  Headache.   Difficulty thinking.   Fatigue.   Shortness of breath.   Weakness.   Rapid heartbeat.  AT WHAT POINT ARE PEOPLE CONSIDERED ANEMIC?  This varies with gender and age.   Both hemoglobin (Hgb) and hematocrit values are used to define anemia. These lab values are obtained from a complete blood count (CBC) test. This is performed at a caregiver's office.    The normal range of hemoglobin values for adult men is 14.0 g/dL to 40.9 g/dL. For nonpregnant women, values are 12.3 g/dL to 81.1 g/dL.   The World Health Organization defines anemia as less than 12 g/dL for nonpregnant women and less than 13 g/dL for men.   For adult males, the average normal hematocrit is 46%, and the range is 40% to 52%.   For adult females, the average normal hematocrit is 41%, and the range is 35% to 47%.   Values that fall below the lower limits can be a sign of anemia and should have further checking (evaluation).  GROUPS OF PEOPLE WHO ARE AT RISK FOR DEVELOPING ANEMIA INCLUDE:   Infants who are breastfed or taking a formula that is not fortified with iron.   Children going through a rapid growth spurt. The iron available can not keep up with the needs for a red cell mass which must grow with the child.   Women in childbearing years. They need iron because of blood loss during menstruation.   Pregnant women. The growing fetus creates a high demand for iron.   People with ongoing gastrointestinal blood loss are at risk of developing iron deficiency.   Individuals with leukemia or cancer who must receive chemotherapy or radiation to treat their disease. The drugs or radiation used to treat these diseases often decreases the bone marrow's ability to make cells of all classes. This includes red blood cells, white blood cells, and platelets.   Individuals with chronic inflammatory conditions such as rheumatoid arthritis or chronic infections.   The elderly.  ARE SOME TYPES OF ANEMIA INHERITED?   Yes, some types of anemia are due to inherited or genetic defects.   Sickle cell anemia. This occurs most often in people of African, African American, and Mediterranean descent.   Thalassemia (or Cooley's anemia). This type is found in people of Mediterranean and Southeast Asian descent. These types of anemia are common.   [place]i. This is rare.  CAN CERTAIN  MEDICATIONS CAUSE A PERSON TO BECOME ANEMIC?  Yes. For example, drugs to fight cancer (chemotherapeutic agents) often cause anemia. These drugs can slow the bone marrow's ability to make red blood cells. If there are not enough red blood cells, the body does not get enough oxygen. WHAT HEMATOCRIT LEVEL IS REQUIRED TO DONATE BLOOD?  The lower limit of an acceptable hematocrit for blood donors is 38%. If you have a low hematocrit value, you should schedule an appointment  with your caregiver. ARE BLOOD TRANSFUSIONS COMMONLY USED TO CORRECT ANEMIA, AND ARE THEY DANGEROUS?  They are used to treat anemia as a last resort. Your caregiver will find the cause of the anemia and correct it if possible. Most blood transfusions are given because of excessive bleeding at the time of surgery, with trauma, or because of bone marrow suppression in patients with cancer or leukemia on chemotherapy. Blood transfusions are safer than ever before. We also know that blood transfusions affect the immune system and may increase certain risks. There is also a concern for human error. In 1/16,000 transfusions, a patient receives a transfusion of blood that is not matched with his or her blood type.  WHAT IS IRON DEFICIENCY ANEMIA AND CAN I CORRECT IT BY CHANGING MY DIET?  Iron is an essential part of hemoglobin. Without enough hemoglobin, anemia develops and the body does not get the right amount of oxygen. Iron deficiency anemia develops after the body has had a low level of iron for a long time. This is either caused by blood loss, not taking in or absorbing enough iron, or increased demands for iron (like pregnancy or rapid growth).  Foods from animal origin such as beef, chicken, and pork, are good sources of iron. Be sure to have one of these foods at each meal. Vitamin C helps your body absorb iron. Foods rich in Vitamin C include citrus, bell pepper, strawberries, spinach and cantaloupe. In some cases, iron supplements may be  needed in order to correct the iron deficiency. In the case of poor absorption, extra iron may have to be given directly into the vein through a needle (intravenously). I HAVE BEEN DIAGNOSED WITH IRON DEFICIENCY ANEMIA AND MY CAREGIVER PRESCRIBED IRON SUPPLEMENTS. HOW LONG WILL IT TAKE FOR MY BLOOD TO BECOME NORMAL?  It depends on the degree of anemia at the beginning of treatment. Most people with mild to moderate iron deficiency, anemia will correct the anemia over a period of 2 to 3 months. But after the anemia is corrected, the iron stored by the body is still low. Caregivers often suggest an additional 6 months of oral iron therapy once the anemia has been reversed. This will help prevent the iron deficiency anemia from quickly happening again. Non-anemic adult males should take iron supplements only under the direction of a doctor, too much iron can cause liver damage.  MY HEMOGLOBIN IS 9 G/DL AND I AM SCHEDULED FOR SURGERY. SHOULD I POSTPONE THE SURGERY?  If you have Hgb of 9, you should discuss this with your caregiver right away. Many patients with similar hemoglobin levels have had surgery without problems. If minimal blood loss is expected for a minor procedure, no treatment may be necessary.  If a greater blood loss is expected for more extensive procedures, you should ask your caregiver about being treated with erythropoietin and iron. This is to accelerate the recovery of your hemoglobin to a normal level before surgery. An anemic patient who undergoes high-blood-loss surgery has a greater risk of surgical complications and need for a blood transfusion, which also carries some risk.  I HAVE BEEN TOLD THAT HEAVY MENSTRUAL PERIODS CAUSE ANEMIA. IS THERE ANYTHING I CAN DO TO PREVENT THE ANEMIA?  Anemia that results from heavy periods is usually due to iron deficiency. You can try to meet the increased demands for iron caused by the heavy monthly blood loss by increasing the intake of iron-rich  foods. Iron supplements may be required. Discuss your concerns with  your caregiver. WHAT CAUSES ANEMIA DURING PREGNANCY?  Pregnancy places major demands on the body. The mother must meet the needs of both her body and her growing baby. The body needs enough iron and folate to make the right amount of red blood cells. To prevent anemia while pregnant, the mother should stay in close contact with her caregiver.  Be sure to eat a diet that has foods rich in iron and folate like liver and dark green leafy vegetables. Folate plays an important role in the normal development of a baby's spinal cord. Folate can help prevent serious disorders like spina bifida. If your diet does not provide adequate nutrients, you may want to talk with your caregiver about nutritional supplements.  WHAT IS THE RELATIONSHIP BETWEEN FIBROID TUMORS AND ANEMIA IN WOMEN?  The relationship is usually caused by the increased menstrual blood loss caused by fibroids. Good iron intake may be required to prevent iron deficiency anemia from developing.  Document Released: 04/16/2004 Document Revised: 08/28/2011 Document Reviewed: 10/01/2010 [facility] Patient Information 2012 [place], [place].Marland Kitchen

## 2011-12-26 NOTE — Progress Notes (Signed)
ANTICOAGULATION CONSULT NOTE - Follow Up Consult  Pharmacy Consult for heparin Indication: chest pain/ACS  Allergies  Allergen Reactions  . Levofloxacin Nausea Only    REACTION: nausea  . Ultram (Tramadol Hcl) Nausea Only    Patient Measurements: Height: 5\' 4"  (162.6 cm) Weight: 218 lb 14.7 oz (99.3 kg) IBW/kg (Calculated) : 54.7  Heparin Dosing Weight:   Vital Signs: Temp: 98.6 F (37 C) (04/04 2116) Temp src: Oral (04/04 2116) BP: 145/79 mmHg (04/04 2116) Pulse Rate: 69  (04/04 2116)  Labs:  Basename 12/25/11 2334 12/25/11 1825 12/25/11 1247 12/25/11 0930  HGB -- -- -- 11.1*  HCT -- -- -- 34.7*  PLT -- -- -- 180  APTT -- -- -- 31  LABPROT -- -- -- 13.7  INR -- -- -- 1.03  HEPARINUNFRC 0.96* -- -- --  CREATININE -- -- -- 1.70*  CKTOTAL 66 76 65 --  CKMB 1.6 1.7 1.5 --  TROPONINI <0.30 <0.30 <0.30 --   Estimated Creatinine Clearance: 32.7 ml/min (by C-G formula based on Cr of 1.7).   Medications:  Infusions:    . heparin    . DISCONTD: heparin 1,000 Units/hr (12/25/11 1626)    Assessment: Patient with high heparin level.  No issues/bleeding per RN.    Goal of Therapy:  Heparin level 0.3-0.7 units/ml  Plan:  Hold x1hr, then resume at 850 units/hr.  Check 8hr level     Aleene Davidson Crowford 12/26/2011,12:33 AM

## 2011-12-26 NOTE — Progress Notes (Signed)
UR complete 

## 2011-12-26 NOTE — Progress Notes (Signed)
Spoke with patient at bedside, dtr was present. Patient is agreeable to HHPT per recommendations, will also request RN due to resp issues to monitor home O2 and aide per patient request. Patient has used [facility] in the past and would like to use them again. Contacted Darl Pikes with [facility] to arrange, states RN will see patient over weekend and PT will start next week due to scheduling restrictions. Attending aware, patient is agreeable.

## 2011-12-26 NOTE — Progress Notes (Signed)
ANTICOAGULATION CONSULT NOTE - Follow Up Consult  Pharmacy Consult for heparin Indication: chest pain/ACS  Allergies  Allergen Reactions  . Levofloxacin Nausea Only    REACTION: nausea  . Ultram (Tramadol Hcl) Nausea Only    Patient Measurements: Height: 5\' 4"  (162.6 cm) Weight: 221 lb 9 oz (100.5 kg) IBW/kg (Calculated) : 54.7   Vital Signs: Temp: 98.9 F (37.2 C) (04/05 0658) Temp src: Oral (04/05 0658) BP: 128/74 mmHg (04/05 0658) Pulse Rate: 74  (04/05 0658)  Labs:  Alvira Philips 12/26/11 1610 12/26/11 0433 12/25/11 2334 12/25/11 1825 12/25/11 1247 12/25/11 0930  HGB -- 9.6* -- -- -- 11.1*  HCT -- 30.2* -- -- -- 34.7*  PLT -- 154 -- -- -- 180  APTT -- 171* -- -- -- 31  LABPROT -- 14.3 -- -- -- 13.7  INR -- 1.09 -- -- -- 1.03  HEPARINUNFRC 0.98* -- 0.96* -- -- --  CREATININE -- 1.85* -- -- -- 1.70*  CKTOTAL -- -- 66 76 65 --  CKMB -- -- 1.6 1.7 1.5 --  TROPONINI -- -- <0.30 <0.30 <0.30 --   Estimated Creatinine Clearance: 30.3 ml/min (by C-G formula based on Cr of 1.85).   Medications:  Infusions:     . DISCONTD: heparin 1,000 Units/hr (12/25/11 1626)  . DISCONTD: heparin 850 Units/hr (12/26/11 0700)    Assessment:  78 yo female presents to ED with shortness of breath & chest pain  Heparin per pharmacy for rule out ACS.  Per cards this morning, chest pain is noncardiac.   Heparin level supratherapeutic, despite dose reduction yesterday.  No issues/bleeding per RN.    Goal of Therapy:  Heparin level 0.3-0.7 units/ml  Plan:   Per cardiology, d/c heparin, start prophylactic SQ heparin     Lynann Beaver PharmD, BCPS Pager [phone] 12/26/2011 10:43 AM

## 2011-12-26 NOTE — Evaluation (Signed)
Physical Therapy Evaluation Patient Details Name: [name] MRN: [id number] DOB: 1934-09-10 Today's Date: 12/26/2011  Problem List:  Patient Active Problem List  Diagnoses  . Benign carcinoid tumor of the stomach  . DIABETES MELLITUS, TYPE II  . DEPRESSION  . HYPERTENSION  . Atrial fibrillation  . Systolic CHF, chronic  . HEMORRHOIDS  . COPD  . INTERSTITIAL LUNG DISEASE  . GERD  . PEPTIC ULCER DISEASE  . OSTEOARTHRITIS  . TRANSIENT ISCHEMIC ATTACK, HX OF  . NSTEMI (non-ST elevated myocardial infarction)  . Anemia of chronic kidney failure  . CKD (chronic kidney disease), stage IV  . Mild protein-calorie malnutrition    Past Medical History:  Past Medical History  Diagnosis Date  . GERD 06/03/2007  . HYPERTENSION 06/03/2007  . OSTEOARTHRITIS 06/03/2007  . PEPTIC ULCER DISEASE 06/03/2007  . TRANSIENT ISCHEMIC ATTACK, HX OF 06/03/2007  . CONGESTIVE HEART FAILURE 06/03/2007    -2d echo 03/19/07 mild diastolic dysfunction, nl LA, RV  . DIABETES MELLITUS, TYPE II 06/03/2007  . LOW BACK PAIN 06/03/2007  . COPD 06/03/2007    Dr Ocie Doyne  . DEPRESSION 06/03/2007  . 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 Atrial fibrillation 08/10/2007  . HEMORRHOIDS 08/08/2009  . CONSTIPATION, INTERMITTENT 02/15/2010  . DYSPNEA/SHORTNESS OF BREATH 06/09/2008  . INTERSTITIAL LUNG DISEASE 08/10/2007    -onset 12/03 with definite steroid responsive component -PFT's October 11, 2009: VC 67%, no airflow obstruction DLC0 14%  . Allergic rhinitis   . Diverticulosis 05/11/2011  . Goiter 05/11/2011  . Shingles 05/11/2011   Past Surgical History:  Past Surgical History  Procedure Date  . Stomach tumors removed 08/22/1991  . Abdominal hysterectomy 10/31/1983    TA[facility]    PT Assessment/Plan/Recommendation PT Assessment Clinical Impression Statement: Pt presents with an NSTEMI with history of heart and lung issues with decreased endurance and overall strength.  Pt tolerated some ambulation, however  became fatigued easily and needed to return to chair.  Daughter states that this is baseline amount of ambulation for patient at home.  Pt will benefit from skilled PT in acute venue to address deficits.  PT discussed D/C options with daughter and pt with PT recommending short term SNF to increase pt safety and decrease burden of care, however pt refused and wishes to have HHPT.   PT Recommendation/Assessment: Patient will need skilled PT in the acute care venue PT Problem List: Decreased strength;Decreased activity tolerance;Decreased balance;Decreased mobility;Decreased coordination;Cardiopulmonary status limiting activity Barriers to Discharge: None Barriers to Discharge Comments: Per daughter, someone will be with her at all times.  PT Therapy Diagnosis : Difficulty walking;Generalized weakness PT Plan PT Frequency: Min 3X/week PT Treatment/Interventions: DME instruction;Functional mobility training;Gait training;Therapeutic activities;Therapeutic exercise;Balance training;Patient/family education PT Recommendation Follow Up Recommendations: Home health PT Equipment Recommended: None recommended by PT PT Goals  Acute Rehab PT Goals PT Goal Formulation: With patient/family Time For Goal Achievement: 2 weeks Pt will go Supine/Side to Sit: with supervision PT Goal: Supine/Side to Sit - Progress: Goal set today Pt will go Sit to Supine/Side: with supervision PT Goal: Sit to Supine/Side - Progress: Goal set today Pt will go Sit to Stand: with modified independence PT Goal: Sit to Stand - Progress: Goal set today Pt will go Stand to Sit: with modified independence PT Goal: Stand to Sit - Progress: Goal set today Pt will Ambulate: 51 - 150 feet;with supervision;with least restrictive assistive device PT Goal: Ambulate - Progress: Goal set today Pt will Perform Home Exercise Program: with  supervision, verbal cues required/provided PT Goal: Perform Home Exercise Program - Progress: Goal set  today  PT Evaluation Precautions/Restrictions  Precautions Precautions: Fall Restrictions Weight Bearing Restrictions: No Prior Functioning  Home Living Lives With: Son;Daughter Receives Help From: Family Type of Home: House Home Layout: One level Home Access: Ramped entrance Bathroom Shower/Tub: Tub only Home Adaptive Equipment: Bedside commode/3-in-1;Walker - rolling;Wheelchair - manual Additional Comments: Per pt/daughter, son and daughter are helping intermittently throughout the day (daughter works full time and son works part time) and she also has a Comptroller the entire day.  Prior Function Level of Independence: Requires assistive device for independence;Needs assistance with ADLs Bath: Total Toileting: Total Dressing: Maximal Grooming: Total Driving: No Vocation: Retired Producer, television/film/video: Awake/alert Overall Cognitive Status: Appears within functional limits for tasks assessed Sensation/Coordination Sensation Light Touch: Appears Intact Coordination Gross Motor Movements are Fluid and Coordinated: Yes Extremity Assessment RLE Assessment RLE Assessment: Exceptions to [facility] RLE Strength RLE Overall Strength Comments: Grossly WFL per observation with ambulation LLE Assessment LLE Assessment: Exceptions to [facility] LLE Strength LLE Overall Strength Comments: Grossly WFL per observation with ambualtion.  Mobility (including Balance) Bed Mobility Bed Mobility: No (Pt was in recliner/returned to recliner. ) Transfers Transfers: Yes Sit to Stand: 4: Min assist;With upper extremity assist;With armrests;From chair/3-in-1 Sit to Stand Details (indicate cue type and reason): Min/guard for safety with cues for hand placement on arm rests when standing.  Stand to Sit: 4: Min assist;With upper extremity assist;With armrests;To chair/3-in-1 Stand to Sit Details: Min/guard for safety with cues for hand placement, safety and controlled descent.    Ambulation/Gait Ambulation/Gait: Yes Ambulation/Gait Assistance: 4: Min assist Ambulation/Gait Assistance Details (indicate cue type and reason): Requires cues for sequencing/technique with RW due to pt tendency to let RW get too far ahead of her.  Contstant cuing for upright posture.  Ambulation Distance (Feet): 35 Feet Assistive device: Rolling walker Gait Pattern: Step-through pattern;Decreased stride length;Trunk flexed Gait velocity: decreased Stairs: No Wheelchair Mobility Wheelchair Mobility: No    Exercise    End of Session PT - End of Session Equipment Utilized During Treatment: Gait belt Activity Tolerance: Patient limited by fatigue;Treatment limited secondary to medical complications (Comment) Patient left: in chair;with call bell in reach;with family/visitor present Nurse Communication: Mobility status for transfers;Mobility status for ambulation General Behavior During Session: [facility] for tasks performed Cognition: [facility] for tasks performed  Page, Meribeth Mattes 12/26/2011, 2:04 PM

## 2011-12-26 NOTE — Progress Notes (Signed)
Patient is well known to [facility] Care Management.  Will provide transition of care support upon discharge.For any additional questions or new referrals please contact Anibal Henderson BSN RN [facility] Liaison at [phone].

## 2011-12-26 NOTE — Progress Notes (Signed)
    Subjective:  Complains of pain under left breast, constant. Dyspnea unchanged. No other complaints.  Objective:  Vital Signs in the last 24 hours: Temp:  [98.3 F (36.8 C)-99 F (37.2 C)] 98.9 F (37.2 C) (04/05 0658) Pulse Rate:  [69-74] 74  (04/05 0658) Resp:  [16-20] 16  (04/05 0658) BP: (107-145)/(56-79) 128/74 mmHg (04/05 0658) SpO2:  [97 %-100 %] 100 % (04/05 0658) Weight:  [99.3 kg (218 lb 14.7 oz)-100.5 kg (221 lb 9 oz)] 100.5 kg (221 lb 9 oz) (04/05 0658)  Intake/Output from previous day: 04/04 0701 - 04/05 0700 In: 627 [P.O.:480; I.V.:147] Out: 301 [Urine:300; Stool:1]  Physical Exam: Pt is alert and oriented, elderly, obese woman in NAD HEENT: normal Neck: JVP - normal Lungs: faint crackles bilaterally CV: RRR without murmur or gallop Abd: soft, NT, Positive BS, obese Ext: 2+ bilateral pretibial edema with chronic stasis changes Skin: warm/dry no rash   Lab Results:  Basename 12/26/11 0433 12/25/11 0930  WBC 7.5 7.8  HGB 9.6* 11.1*  PLT 154 180    Basename 12/26/11 0433 12/25/11 0930  NA 143 143  K 3.9 3.6  CL 100 99  CO2 36* 36*  GLUCOSE 83 92  BUN 26* 27*  CREATININE 1.85* 1.70*    Basename 12/25/11 2334 12/25/11 1825  TROPONINI <0.30 <0.30    Cardiac Studies: 2D Echo: Impressions:  - Normal LV size and systolic function, EF 55-60%. Mild LVH. Normal RV size and systolic function. Borderline pulmonary hypertension.  Assessment/Plan:  1. Acute on chronic respiratory failure 2. Acute on chronic diastolic heart failure 3. Acute on chronic kidney disease, stage 3-4 4. Troponin elevation on POC test but now negative x 3  Echo reviewed and no significant abnormality noted. BNP elevated but I'm not sure of the clinical significance in this patient with chronic interstitial lung disease and CKD. I would stop IV lasix as her BUN/Creatinine are increasing and she really has not responded with a significant diuresis. I would stop this and  continue her other antihypertensive meds. No ACE or ARB in the setting of worsening renal function. Her chest pain is clearly noncardiac and I would not pursue an ischemic eval. I don't have much else to add to her care. Will sign off. Please call if questions.  Tonny Bollman, M.D. 12/26/2011, 10:08 AM

## 2011-12-26 NOTE — Discharge Summary (Signed)
Patient ID: GENINE BECKETT MRN: [id number] DOB/AGE: [birth year] 76 y.o.  Admit date: 12/25/2011 Discharge date: 12/26/2011  Primary Care Physician:  Oliver Barre, MD, MD  Discharge Diagnoses:    Present on Admission:  .NSTEMI (non-ST elevated myocardial infarction) .DIABETES MELLITUS, TYPE II .HYPERTENSION .PEPTIC ULCER DISEASE .COPD .Systolic CHF, chronic .Anemia of chronic kidney failure .CKD (chronic kidney disease), stage IV .Mild protein-calorie malnutrition  Principal Problem:  *NSTEMI (non-ST elevated myocardial infarction) Active Problems:  DIABETES MELLITUS, TYPE II  HYPERTENSION  Systolic CHF, chronic  COPD  PEPTIC ULCER DISEASE  Anemia of chronic kidney failure  CKD (chronic kidney disease), stage IV  Mild protein-calorie malnutrition  Assessment/Plan:   Principal Problem:   * Chest pain  - perhaps secondary to NSTEMI in the setting of increased troponin level  - cycle cardiac enzymes, 2D ECHO, TSH, lipid panel, A1c  - cardiology consulted, recommended continuing home medications and avoiding ARBs/ACEi due to renal insufficiency  Active Problems:   DIABETES MELLITUS, TYPE II  - continue Amaryl   HYPERTENSION  - continue home medication regimen   Systolic CHF, chronic  - based on 2 D ECHO in 2011 EF 55%  - follow up 2 D ECHO on this admission EF 55-60% - resume home mediations on discahrge  COPD  - stable  - continue spiriva   PEPTIC ULCER DISEASE  - continue protonix 40 mg daily   Anemia of chronic kidney failure  - hemoglobin stable since admission   CKD (chronic kidney disease), stage IV  - stable creatinine  Mild protein-calorie malnutrition  - nutrition consult appreciated   Disposition  - stable for discharge home today with home health PT  DVT Prophylaxis - heparin sub Q given while hospitalized  Code Status - full code   Education  - test results and diagnostic studies were discussed with patient and pt's family  - patient and  family have verbalized the understanding  - questions were answered at the bedside and contact information was provided for additional questions or concerns     Medication List  As of 12/26/2011  1:24 PM   TAKE these medications         clotrimazole-betamethasone cream   Commonly known as: LOTRISONE      ECOTRIN LOW STRENGTH 81 MG EC tablet   Generic drug: aspirin   Take 81 mg by mouth daily.      fexofenadine 180 MG tablet   Commonly known as: ALLEGRA   Take 180 mg by mouth daily.      furosemide 20 MG tablet   Commonly known as: LASIX   3 tablets by mouth two times a day      glimepiride 2 MG tablet   Commonly known as: AMARYL   Take 1 tablet (2 mg total) by mouth every morning.      lovastatin 40 MG tablet   Commonly known as: MEVACOR   Take 1 tablet (40 mg total) by mouth daily.      multivitamin tablet   Take 1 tablet by mouth daily.      nebivolol 10 MG tablet   Commonly known as: BYSTOLIC   Take 1 tablet (10 mg total) by mouth daily.      omeprazole 20 MG capsule   Commonly known as: PRILOSEC   TAKE ONE (1) CAPSULE EACH DAY      ondansetron 4 MG tablet   Commonly known as: ZOFRAN   Take 1 tablet (4 mg total) by mouth daily as  needed for nausea.      potassium chloride SA 20 MEQ tablet   Commonly known as: K-DUR,KLOR-CON   TAKE 3 TABLETS DAILY      predniSONE 10 MG tablet   Commonly known as: DELTASONE   TAKE ONE (1) TABLET EACH DAY      SPIRIVA HANDIHALER 18 MCG inhalation capsule   Generic drug: tiotropium   INHALE CONTENTS OF ONE CAPSULE IN       HANDIHALER ONCE DAILY            Disposition and Follow-up:  - patient is medically stable to be discharged home today  Consults:  1. Cardiology 2. Physical therapy - recommended HHPT  Significant Diagnostic Studies:  Dg Chest 2 View 12/25/2011    IMPRESSION: Stable pulmonary fibrosis changes with no definite acute or new focal abnormality suggested.     Brief H and P: 78 year old female with  multiple medical history including but not limited to HTN, depression, DM, dyslipidemia who presented with complaints of chest pain, retrosternal, non radiating, started at rest, about 5-6/10 in intensity with associated shortness of breath on exertion. Patient reports no palpitations, no fever or chills, no nausea or vomiting, no abdominal pain. No complaints of lightheadedness or dizziness or loss of consciousness.   Physical Exam on Discharge:  Filed Vitals:   12/25/11 1537 12/25/11 1558 12/25/11 2116 12/26/11 0658  BP: 129/73  145/79 128/74  Pulse: 71  69 74  Temp: 98.3 F (36.8 C)  98.6 F (37 C) 98.9 F (37.2 C)  TempSrc: Oral  Oral Oral  Resp: 20  18 16   Height: 5\' 4"  (1.626 m)     Weight: 99.3 kg (218 lb 14.7 oz)   100.5 kg (221 lb 9 oz)  SpO2: 100% 100% 100% 100%     Intake/Output Summary (Last 24 hours) at 12/26/11 1324 Last data filed at 12/26/11 1113  Gross per 24 hour  Intake 869.98 ml  Output    701 ml  Net 168.98 ml    General: Alert, awake, oriented x3, in no acute distress. HEENT: No bruits, no goiter. Heart: Regular rate and rhythm, without murmurs, rubs, gallops. Lungs: Clear to auscultation bilaterally. Abdomen: Soft, nontender, nondistended, positive bowel sounds. Extremities: No clubbing cyanosis or edema with positive pedal pulses. Neuro: Grossly intact, nonfocal.  CBC:    Component Value Date/Time   WBC 7.5 12/26/2011 0433   HGB 9.6* 12/26/2011 0433   HCT 30.2* 12/26/2011 0433   PLT 154 12/26/2011 0433   MCV 91.8 12/26/2011 0433   NEUTROABS 3.4 04/11/2011 0515   LYMPHSABS 2.6 04/11/2011 0515   MONOABS 0.7 04/11/2011 0515   EOSABS 0.1 04/11/2011 0515   BASOSABS 0.0 04/11/2011 0515    Basic Metabolic Panel:    Component Value Date/Time   NA 143 12/26/2011 0433   K 3.9 12/26/2011 0433   CL 100 12/26/2011 0433   CO2 36* 12/26/2011 0433   BUN 26* 12/26/2011 0433   CREATININE 1.85* 12/26/2011 0433   GLUCOSE 83 12/26/2011 0433   CALCIUM 9.0 12/26/2011 0433    Time  spent on Discharge: Greater than 30 minutes  Signed: Elam Ellis 12/26/2011, 1:24 PM

## 2011-12-26 NOTE — Progress Notes (Signed)
12/26/11 The patient has been educated in heart failure signs and symptoms using the teach back method and the heart zone tool packet.The PIV was removed. Telemetry monitor was removed. Patient's daughter is at the hospital to transport the patient to the home by private vehicle.

## 2011-12-30 ENCOUNTER — Telehealth: Payer: Self-pay

## 2011-12-30 DIAGNOSIS — R531 Weakness: Secondary | ICD-10-CM

## 2011-12-30 DIAGNOSIS — R269 Unspecified abnormalities of gait and mobility: Secondary | ICD-10-CM

## 2011-12-30 MED ORDER — HYDROCODONE-HOMATROPINE 5-1.5 MG/5ML PO SYRP: 5.0000 mL | ORAL_SOLUTION | Freq: Four times a day (QID) | ORAL | Status: AC | PRN

## 2011-12-30 NOTE — Telephone Encounter (Signed)
Patients daughter called to request something to be called in for a cough.

## 2011-12-30 NOTE — Telephone Encounter (Signed)
Pt called requesting order [facility] to her home for nursing services. Pt states that she has been extremely weak and unable to fully care for herself since hospital discharge, please advise.

## 2011-12-30 NOTE — Telephone Encounter (Signed)
Faxed hardcopy to pharmacy and called the patient to inform 

## 2011-12-30 NOTE — Telephone Encounter (Signed)
Done hardcopy to robin  

## 2011-12-30 NOTE — Telephone Encounter (Signed)
Called the patient informed referral taken care of, called [facility] gave the patient information.

## 2011-12-30 NOTE — Telephone Encounter (Signed)
Home care and PT ordered  Robin to refer to [facility] as well if not alread

## 2012-01-01 ENCOUNTER — Telehealth: Payer: Self-pay

## 2012-01-01 NOTE — Telephone Encounter (Signed)
Patients daughter called and the patient has a fever of 101.7, please advise on medication to reduce temp.

## 2012-01-01 NOTE — Telephone Encounter (Signed)
Patients daughter informed

## 2012-01-01 NOTE — Telephone Encounter (Signed)
Tylenol OTC is ok, but she should have OV first available, and consider even UC or ER, since the fever was not part of recent hospn apr 4-5

## 2012-01-02 ENCOUNTER — Ambulatory Visit (INDEPENDENT_AMBULATORY_CARE_PROVIDER_SITE_OTHER): Payer: Medicare Other | Admitting: Internal Medicine

## 2012-01-02 ENCOUNTER — Encounter: Payer: Self-pay | Admitting: Internal Medicine

## 2012-01-02 ENCOUNTER — Ambulatory Visit (INDEPENDENT_AMBULATORY_CARE_PROVIDER_SITE_OTHER)
Admission: RE | Admit: 2012-01-02 | Discharge: 2012-01-02 | Disposition: A | Discharge status: Home / Self Care | Payer: Medicare Other | Source: Ambulatory Visit | Attending: Internal Medicine | Admitting: Internal Medicine

## 2012-01-02 ENCOUNTER — Other Ambulatory Visit (INDEPENDENT_AMBULATORY_CARE_PROVIDER_SITE_OTHER): Payer: Medicare Other

## 2012-01-02 ENCOUNTER — Telehealth: Payer: Self-pay | Admitting: *Deleted

## 2012-01-02 VITALS — BP 122/68 | HR 80 | Temp 98.6°F

## 2012-01-02 DIAGNOSIS — N184 Chronic kidney disease, stage 4 (severe): Secondary | ICD-10-CM

## 2012-01-02 DIAGNOSIS — N039 Chronic nephritic syndrome with unspecified morphologic changes: Secondary | ICD-10-CM

## 2012-01-02 DIAGNOSIS — D631 Anemia in chronic kidney disease: Secondary | ICD-10-CM

## 2012-01-02 DIAGNOSIS — N189 Chronic kidney disease, unspecified: Secondary | ICD-10-CM

## 2012-01-02 DIAGNOSIS — R062 Wheezing: Secondary | ICD-10-CM

## 2012-01-02 DIAGNOSIS — I509 Heart failure, unspecified: Secondary | ICD-10-CM

## 2012-01-02 DIAGNOSIS — J209 Acute bronchitis, unspecified: Secondary | ICD-10-CM

## 2012-01-02 DIAGNOSIS — I5022 Chronic systolic (congestive) heart failure: Secondary | ICD-10-CM

## 2012-01-02 LAB — CBC WITH DIFFERENTIAL/PLATELET
Basophils Absolute: 0 10*3/uL (ref 0.0–0.1)
Eosinophils Relative: 0.2 % (ref 0.0–5.0)
Lymphs Abs: 1.2 10*3/uL (ref 0.7–4.0)
Monocytes Relative: 6.2 % (ref 3.0–12.0)
Neutrophils Relative %: 71.2 % (ref 43.0–77.0)
Platelets: 180 10*3/uL (ref 150.0–400.0)
RDW: 14 % (ref 11.5–14.6)
WBC: 5.3 10*3/uL (ref 4.5–10.5)

## 2012-01-02 LAB — BASIC METABOLIC PANEL
BUN: 25 mg/dL — ABNORMAL HIGH (ref 6–23)
Calcium: 8.3 mg/dL — ABNORMAL LOW (ref 8.4–10.5)
Creatinine, Ser: 1.9 mg/dL — ABNORMAL HIGH (ref 0.4–1.2)
GFR: 33.06 mL/min — ABNORMAL LOW (ref >60.00)
Glucose, Bld: 171 mg/dL — ABNORMAL HIGH (ref 70–99)

## 2012-01-02 MED ORDER — AZITHROMYCIN 250 MG PO TABS: ORAL_TABLET | ORAL | Status: AC

## 2012-01-02 NOTE — Telephone Encounter (Signed)
Left msg on triage went out for home visit on yesterday. Faxed over form with my concerns to md. Pt is schedule to see md today at 1:30. Wanting to make sure md review notes before seeing patient... 01/02/12@11 :07am/LMB

## 2012-01-02 NOTE — Patient Instructions (Addendum)
Take all new medications as prescribed - the antibiotic Continue all other medications as before; OK to re-start the fluid pills as you normally take them Please go to XRAY in the Basement for the x-ray test Please go to LAB in the Basement for the blood and/or urine tests to be done today You will be contacted regarding the referral for: cardiology Please return in 3 months, or sooner if needed

## 2012-01-02 NOTE — Telephone Encounter (Signed)
Patient is being seen today 01/02/12

## 2012-01-03 ENCOUNTER — Encounter: Payer: Self-pay | Admitting: Internal Medicine

## 2012-01-03 NOTE — Assessment & Plan Note (Signed)
For f/u cbc, o/w Continue all other medications as before,  to f/u any worsening symptoms or concerns

## 2012-01-03 NOTE — Progress Notes (Signed)
Subjective:    Patient ID: [name], female    DOB: 03-24-1934, 78 y.o.   MRN: [id number]  HPI  Here with acute onset mild to mod 2-3 days ST, HA, general weakness and malaise, with prod cough greenish sputum, but Pt denies chest pain, increased sob or doe, wheezing, orthopnea, PND,  palpitations, dizziness or syncope, but has had mild worsening leg swelling, confusion, med noncompliance including her diuretic in the past 3 days, and not gotten out of bed last 2 days.  O2 sat per nurse at home 96 on 2.5 L.  PT planned to start mon apri 15.  Refuses skillled facility.  Pt denies new neurological symptoms such as new headache, or facial or extremity weakness or numbness/   Pt denies polydipsia, polyuria.  Was recently hospd with NSTEMI, seen per cardiology, to cont home meds, avoid arb/ace due to CKD.   Pt denies fever, wt loss, night sweats. Past Medical History  Diagnosis Date  . GERD 06/03/2007  . HYPERTENSION 06/03/2007  . OSTEOARTHRITIS 06/03/2007  . PEPTIC ULCER DISEASE 06/03/2007  . TRANSIENT ISCHEMIC ATTACK, HX OF 06/03/2007  . CONGESTIVE HEART FAILURE 06/03/2007    -2d echo 03/19/07 mild diastolic dysfunction, nl LA, RV  . DIABETES MELLITUS, TYPE II 06/03/2007  . LOW BACK PAIN 06/03/2007  . COPD 06/03/2007    Dr Ocie Doyne  . DEPRESSION 06/03/2007  . 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 Atrial fibrillation 08/10/2007  . HEMORRHOIDS 08/08/2009  . CONSTIPATION, INTERMITTENT 02/15/2010  . DYSPNEA/SHORTNESS OF BREATH 06/09/2008  . INTERSTITIAL LUNG DISEASE 08/10/2007    -onset 12/03 with definite steroid responsive component -PFT's October 11, 2009: VC 67%, no airflow obstruction DLC0 14%  . Allergic rhinitis   . Diverticulosis 05/11/2011  . Goiter 05/11/2011  . Shingles 05/11/2011   Past Surgical History  Procedure Date  . Stomach tumors removed 08/22/1991  . Abdominal hysterectomy 10/31/1983    TAH    reports that she has never smoked. She has never used smokeless tobacco. She  reports that she does not drink alcohol or use illicit drugs. family history includes Cancer in her sister and Hypertension in her father and mother. Allergies  Allergen Reactions  . Levofloxacin Nausea Only    REACTION: nausea  . Ultram (Tramadol Hcl) Nausea Only   Current Outpatient Prescriptions on File Prior to Visit  Medication Sig Dispense Refill  . aspirin (ECOTRIN LOW STRENGTH) 81 MG EC tablet Take 81 mg by mouth daily.        . clotrimazole-betamethasone (LOTRISONE) cream        . fexofenadine (ALLEGRA) 180 MG tablet Take 180 mg by mouth daily.        . furosemide (LASIX) 20 MG tablet 3 tablets by mouth two times a day  180 tablet  11  . glimepiride (AMARYL) 2 MG tablet Take 1 tablet (2 mg total) by mouth every morning.  30 tablet  11  . HYDROcodone-homatropine (HYCODAN) 5-1.5 MG/5ML syrup Take 5 mLs by mouth every 6 (six) hours as needed for cough.  120 mL  0  . lovastatin (MEVACOR) 40 MG tablet Take 1 tablet (40 mg total) by mouth daily.  30 tablet  11  . Multiple Vitamin (MULTIVITAMIN) tablet Take 1 tablet by mouth daily.        . nebivolol (BYSTOLIC) 10 MG tablet Take 1 tablet (10 mg total) by mouth daily.  30 tablet  11  . omeprazole (PRILOSEC) 20 MG capsule TAKE ONE (1)  CAPSULE EACH DAY  30 capsule  6  . ondansetron (ZOFRAN) 4 MG tablet Take 1 tablet (4 mg total) by mouth daily as needed for nausea.  30 tablet  1  . potassium chloride SA (K-DUR,KLOR-CON) 20 MEQ tablet TAKE 3 TABLETS DAILY  90 tablet  5  . predniSONE (DELTASONE) 10 MG tablet TAKE ONE (1) TABLET EACH DAY  30 tablet  5  . SPIRIVA HANDIHALER 18 MCG inhalation capsule INHALE CONTENTS OF ONE CAPSULE IN       HANDIHALER ONCE DAILY  30 each  3   Review of Systems Review of Systems  Constitutional: Negative for diaphoresis and unexpected weight change.  HENT: Negative for drooling and tinnitus.   Eyes: Negative for photophobia and visual disturbance.  Respiratory: Negative for choking and stridor.     Gastrointestinal: Negative for vomiting and blood in stool.  Genitourinary: Negative for hematuria and decreased urine volume.  Skin: Negative for color change and wound.  Neurological: Negative for tremors and numbness.  Objective:   Physical Exam BP 122/68  Pulse 80  Temp(Src) 98.6 F (37 C) (Oral) Physical Exam  VS noted Constitutional: Pt appears well-developed and well-nourished.  HENT: Head: Normocephalic.  Right Ear: External ear normal.  Left Ear: External ear normal.  Bilat tm's mild erythema.  Sinus nontender.  Pharynx mild erythema Eyes: Conjunctivae and EOM are normal. Pupils are equal, round, and reactive to light.  Neck: Normal range of motion. Neck supple.  Cardiovascular: Normal rate and regular rhythm.   Pulmonary/Chest: Effort normal and breath sounds decreased with bibas rales - ? Dry/chronic Neurological: Pt is alert. No cranial nerve deficit.  Skin: Skin is warm. No erythema. 1-2+ edema bilat Psychiatric: Pt behavior is normal. Thought content c/w mild worsening confusion     Assessment & Plan:

## 2012-01-03 NOTE — Assessment & Plan Note (Signed)
For f/u bmet,  to f/u any worsening symptoms or concerns, o/w cont same tx

## 2012-01-03 NOTE — Assessment & Plan Note (Signed)
Mild to mod, for antibx course,  to f/u any worsening symptoms or concerns, for f/u cxr today

## 2012-01-03 NOTE — Assessment & Plan Note (Signed)
Mild, exac by not taking the diuretic recently - for re-start, also refer card for f/u

## 2012-01-03 NOTE — Assessment & Plan Note (Signed)
Mild to mod, for low dose predpack,  to f/u any worsening symptoms or concerns 

## 2012-01-07 DIAGNOSIS — Z0279 Encounter for issue of other medical certificate: Secondary | ICD-10-CM

## 2012-01-21 DIAGNOSIS — I5020 Unspecified systolic (congestive) heart failure: Secondary | ICD-10-CM

## 2012-01-21 DIAGNOSIS — E119 Type 2 diabetes mellitus without complications: Secondary | ICD-10-CM

## 2012-01-21 DIAGNOSIS — I214 Non-ST elevation (NSTEMI) myocardial infarction: Secondary | ICD-10-CM

## 2012-01-21 DIAGNOSIS — I509 Heart failure, unspecified: Secondary | ICD-10-CM

## 2012-01-26 ENCOUNTER — Ambulatory Visit (HOSPITAL_COMMUNITY)
Admission: RE | Admit: 2012-01-26 | Discharge: 2012-01-26 | Disposition: A | Discharge status: Home / Self Care | Payer: Medicare Other | Source: Ambulatory Visit | Attending: Internal Medicine | Admitting: Internal Medicine

## 2012-01-26 VITALS — BP 128/66 | HR 75 | Wt 217.5 lb

## 2012-01-26 DIAGNOSIS — J4489 Other specified chronic obstructive pulmonary disease: Secondary | ICD-10-CM | POA: Insufficient documentation

## 2012-01-26 DIAGNOSIS — I5032 Chronic diastolic (congestive) heart failure: Secondary | ICD-10-CM | POA: Insufficient documentation

## 2012-01-26 DIAGNOSIS — Z8673 Personal history of transient ischemic attack (TIA), and cerebral infarction without residual deficits: Secondary | ICD-10-CM | POA: Insufficient documentation

## 2012-01-26 DIAGNOSIS — I509 Heart failure, unspecified: Secondary | ICD-10-CM

## 2012-01-26 DIAGNOSIS — K219 Gastro-esophageal reflux disease without esophagitis: Secondary | ICD-10-CM | POA: Insufficient documentation

## 2012-01-26 DIAGNOSIS — J449 Chronic obstructive pulmonary disease, unspecified: Secondary | ICD-10-CM | POA: Insufficient documentation

## 2012-01-26 DIAGNOSIS — E119 Type 2 diabetes mellitus without complications: Secondary | ICD-10-CM | POA: Insufficient documentation

## 2012-01-26 DIAGNOSIS — Z79899 Other long term (current) drug therapy: Secondary | ICD-10-CM | POA: Insufficient documentation

## 2012-01-26 DIAGNOSIS — I4891 Unspecified atrial fibrillation: Secondary | ICD-10-CM | POA: Insufficient documentation

## 2012-01-26 DIAGNOSIS — Z7982 Long term (current) use of aspirin: Secondary | ICD-10-CM | POA: Insufficient documentation

## 2012-01-26 DIAGNOSIS — M1990 Unspecified osteoarthritis, unspecified site: Secondary | ICD-10-CM | POA: Insufficient documentation

## 2012-01-26 DIAGNOSIS — I10 Essential (primary) hypertension: Secondary | ICD-10-CM | POA: Insufficient documentation

## 2012-01-26 NOTE — Progress Notes (Signed)
Referring Physician: Dr. Jonny Ruiz Primary Care: Dr. Jonny Ruiz Primary Cardiologist: none Pulmonologist: Dr. Sherene Sires (has not had f/u in a long time)  HPI: [name] is a 78 y.o. female with history of diastolic heart failure, hypertension, DM, ILD (?), obestiy, COPD on continuous O2 since 2004 and CKD.    Echo 12/25/11: LVEF 55-60%, Grade 1 diastolic dysfunction, Mildly dilated LA, RV systolic function normal, IVC measured 2.0.  PAPP 36 mmHg.  Admitted 12/26/11 with chest pain and dyspnea.  CE negative.  Felt chest pain related to volume overload.  She was diuresed and discharged on 4/5.  Cr 1.85 at the time.  She followed up with Dr. Jonny Ruiz and continued on diuretic therapy.  Recheck Cr 1.9.  She has been referred by Dr. Jonny Ruiz for follow up in the HF clinic.  She currently feels ok.  She feels like her dyspnea is at baseline but did have an episode of increased SOB this morning.  She says she has some lower extremity edema, but it has improved.  She lives at home with daughter and granddaughter.  She uses a walker or wheelchair to get around as her legs get weak.  Uses bedside commode.  Chronic 3-4 pillow orthopnea.  No PND.  No chest pain.  She does not weigh herself because she does not have a scale.  No cough.  No dizziness/syncope.  No sleep study.  No prior cath.    Review of Systems: [y] = yes, [ ]  = no   General: Weight gain [ ] ; Weight loss [ ] ; Anorexia [ ] ; Fatigue [ ] ; Fever [ ] ; Chills [ ] ; Weakness Cove.Etienne ]  Cardiac: Chest pain/pressure [ ] ; Resting SOB [ ] ; Exertional SOB Cove.Etienne ]; Orthopnea [ y]; Pedal Edema [ ] ; Palpitations [ ] ; Syncope [ ] ; Presyncope [ ] ; Paroxysmal nocturnal dyspnea[ ]   Pulmonary: Cough [ ] ; Wheezing[ ] ; Hemoptysis[ ] ; Sputum [ ] ; Snoring [ ]   GI: Vomiting[ ] ; Dysphagia[ ] ; Melena[ ] ; Hematochezia [ ] ; Heartburn[ ] ; Abdominal pain [ ] ; Constipation [ ] ; Diarrhea [ ] ; BRBPR [ ]   GU: Hematuria[ ] ; Dysuria [ ] ; Nocturia[ ]   Vascular: Pain in legs with walking [ ] ; Pain in feet with  lying flat [ ] ; Non-healing sores [ ] ; Stroke [ ] ; TIA [ ] ; Slurred speech [ ] ;  Neuro: Headaches[ ] ; Vertigo[ ] ; Seizures[ ] ; Paresthesias[ ] ;Blurred vision [ ] ; Diplopia [ ] ; Vision changes [ ]   Ortho/Skin: Arthritis [ ] ; Joint pain [ ] ; Muscle pain [ ] ; Joint swelling [ ] ; Back Pain [ ] ; Rash [ ]   Psych: Depression[ ] ; Anxiety[ ]   Heme: Bleeding problems [ ] ; Clotting disorders [ ] ; Anemia [ ]   Endocrine: Diabetes [ ] ; Thyroid dysfunction[ ]    Past Medical History  Diagnosis Date  . GERD 06/03/2007  . HYPERTENSION 06/03/2007  . OSTEOARTHRITIS 06/03/2007  . PEPTIC ULCER DISEASE 06/03/2007  . TRANSIENT ISCHEMIC ATTACK, HX OF 06/03/2007  . CONGESTIVE HEART FAILURE 06/03/2007    -2d echo 03/19/07 mild diastolic dysfunction, nl LA, RV  . DIABETES MELLITUS, TYPE II 06/03/2007  . LOW BACK PAIN 06/03/2007  . COPD 06/03/2007    Dr Ocie Doyne  . DEPRESSION 06/03/2007  . 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 Atrial fibrillation 08/10/2007  . HEMORRHOIDS 08/08/2009  . CONSTIPATION, INTERMITTENT 02/15/2010  . DYSPNEA/SHORTNESS OF BREATH 06/09/2008  . INTERSTITIAL LUNG DISEASE 08/10/2007    -onset 12/03 with definite steroid responsive component -PFT's October 11, 2009: VC 67%, no airflow  obstruction DLC0 14%  . Allergic rhinitis   . Diverticulosis 05/11/2011  . Goiter 05/11/2011  . Shingles 05/11/2011    Current Outpatient Prescriptions  Medication Sig Dispense Refill  . aspirin (ECOTRIN LOW STRENGTH) 81 MG EC tablet Take 81 mg by mouth daily.        . clotrimazole-betamethasone (LOTRISONE) cream        . fexofenadine (ALLEGRA) 180 MG tablet Take 180 mg by mouth daily.        . furosemide (LASIX) 20 MG tablet 3 tablets by mouth two times a day  180 tablet  11  . glimepiride (AMARYL) 2 MG tablet Take 1 tablet (2 mg total) by mouth every morning.  30 tablet  11  . lovastatin (MEVACOR) 40 MG tablet Take 1 tablet (40 mg total) by mouth daily.  30 tablet  11  . Multiple Vitamin  (MULTIVITAMIN) tablet Take 1 tablet by mouth daily.        . nebivolol (BYSTOLIC) 10 MG tablet Take 1 tablet (10 mg total) by mouth daily.  30 tablet  11  . omeprazole (PRILOSEC) 20 MG capsule TAKE ONE (1) CAPSULE EACH DAY  30 capsule  6  . ondansetron (ZOFRAN) 4 MG tablet Take 1 tablet (4 mg total) by mouth daily as needed for nausea.  30 tablet  1  . potassium chloride SA (K-DUR,KLOR-CON) 20 MEQ tablet TAKE 3 TABLETS DAILY  90 tablet  5  . predniSONE (DELTASONE) 10 MG tablet TAKE ONE (1) TABLET EACH DAY  30 tablet  5  . SPIRIVA HANDIHALER 18 MCG inhalation capsule INHALE CONTENTS OF ONE CAPSULE IN       HANDIHALER ONCE DAILY  30 each  3  . traMADol (ULTRAM) 50 MG tablet Take 50 mg by mouth every 6 (six) hours as needed.        Allergies  Allergen Reactions  . Levofloxacin Nausea Only    REACTION: nausea  . Ultram (Tramadol Hcl) Nausea Only    History   Social History  . Marital Status: Widowed    Spouse Name: N/A    Number of Children: N/A  . Years of Education: N/A   Occupational History  . Retired    Social History Main Topics  . Smoking status: Never Smoker   . Smokeless tobacco: Never Used  . Alcohol Use: No  . Drug Use: No  . Sexually Active: No   Other Topics Concern  . Not on file   Social History Narrative   ChildrenLives with her daughter    Family History  Problem Relation Age of Onset  . Hypertension Mother   . Hypertension Father   . Cancer Sister     Breast Cancer, Liver Cancer    PHYSICAL EXAM: Filed Vitals:   01/26/12 1006  BP: 128/66  Pulse: 75  Weight: 217 lb 8 oz (98.657 kg)  SpO2: 100%    General:  Elderly. Chronicly-ill appearing. Sitting in WC. No respiratory difficulty HEENT: normal Neck: supple. Hard to assess JVP ~7-8. Carotids 2+ bilat; no bruits. No lymphadenopathy or thryomegaly appreciated. Cor: PMI nondisplaced. Regular rate & rhythm. 2/6 SEM RSB Lungs: clear Abdomen: obese soft, nontender, nondistended. No bruits or  masses. Good bowel sounds. Extremities: no cyanosis, clubbing, rash, trace edema Neuro: alert & oriented x 3, cranial nerves grossly intact. moves all 4 extremities w/o difficulty. Affect pleasant.    ASSESSMENT & PLAN:

## 2012-01-26 NOTE — Patient Instructions (Signed)
Continue current medications.  Follow up with Dr. Gala Romney in 2 months and bring daughter.    Do the following things EVERYDAY: 1) Weigh yourself in the morning before breakfast. Write it down and keep it in a log. 2) Take your medicines as prescribed 3) Eat low salt foods--Limit salt (sodium) to 2000mg  per day.  4) Stay as active as you can everyday 5) Keep fluid intake less than 2 liters daily

## 2012-01-26 NOTE — Assessment & Plan Note (Signed)
Volume status currently much improved. Overall functional capacity very limited due to comorbidities and not so much HF. That said she does have a propensity for volume overload. Discussed need for daily weights and reviewed use of sliding scale diuretics. However, she currently does not have a scale and says she has a hard time standing so not sure she will be able to do weigh anyway. We provided her a scale in clinic and instructed the person accompanying her today how she can be assisted. We will have home health visit and see if they can facilitate this as well. Have asked her to come back in 6 weeks with her daughter to revisit her progress. No change to meds at this time.

## 2012-02-04 ENCOUNTER — Ambulatory Visit: Payer: PRIVATE HEALTH INSURANCE | Admitting: Internal Medicine

## 2012-02-24 ENCOUNTER — Telehealth: Payer: Self-pay

## 2012-02-24 NOTE — Telephone Encounter (Signed)
Pt called stating she misplaced Amaryl bottle and is now completely out. Pt was requesting medication from our office because Rx cannot be refilled. Pt advised to advise pharmacy of same for early refill and to ask them to contact us if they need authorization. Pt expressed understanding.

## 2012-03-23 ENCOUNTER — Other Ambulatory Visit: Payer: Self-pay | Admitting: Internal Medicine

## 2012-04-09 ENCOUNTER — Ambulatory Visit (INDEPENDENT_AMBULATORY_CARE_PROVIDER_SITE_OTHER): Payer: Medicare Other | Admitting: Internal Medicine

## 2012-04-09 ENCOUNTER — Encounter: Payer: Self-pay | Admitting: Internal Medicine

## 2012-04-09 ENCOUNTER — Other Ambulatory Visit (INDEPENDENT_AMBULATORY_CARE_PROVIDER_SITE_OTHER): Payer: Medicare Other

## 2012-04-09 VITALS — BP 110/78 | HR 80 | Temp 99.4°F

## 2012-04-09 DIAGNOSIS — E119 Type 2 diabetes mellitus without complications: Secondary | ICD-10-CM

## 2012-04-09 DIAGNOSIS — I10 Essential (primary) hypertension: Secondary | ICD-10-CM

## 2012-04-09 DIAGNOSIS — D631 Anemia in chronic kidney disease: Secondary | ICD-10-CM

## 2012-04-09 DIAGNOSIS — N189 Chronic kidney disease, unspecified: Secondary | ICD-10-CM

## 2012-04-09 DIAGNOSIS — J8410 Pulmonary fibrosis, unspecified: Secondary | ICD-10-CM

## 2012-04-09 DIAGNOSIS — H109 Unspecified conjunctivitis: Secondary | ICD-10-CM | POA: Insufficient documentation

## 2012-04-09 LAB — HEMOGLOBIN A1C: Hgb A1c MFr Bld: 6.8 % — ABNORMAL HIGH (ref 4.6–6.5)

## 2012-04-09 LAB — CBC WITH DIFFERENTIAL/PLATELET
Basophils Relative: 0.2 % (ref 0.0–3.0)
Eosinophils Relative: 0.3 % (ref 0.0–5.0)
HCT: 34.3 % — ABNORMAL LOW (ref 36.0–46.0)
MCV: 89.4 fl (ref 78.0–100.0)
Monocytes Relative: 3.4 % (ref 3.0–12.0)
Neutro Abs: 5.3 10*3/uL (ref 1.4–7.7)
Platelets: 183 10*3/uL (ref 150.0–400.0)
RDW: 14.6 % (ref 11.5–14.6)

## 2012-04-09 LAB — LIPID PANEL
Cholesterol: 165 mg/dL (ref 0–200)
Triglycerides: 103 mg/dL (ref 0.0–149.0)
VLDL: 20.6 mg/dL (ref 0.0–40.0)

## 2012-04-09 LAB — BASIC METABOLIC PANEL
BUN: 26 mg/dL — ABNORMAL HIGH (ref 6–23)
Chloride: 97 mEq/L (ref 96–112)
Glucose, Bld: 147 mg/dL — ABNORMAL HIGH (ref 70–99)
Potassium: 4.2 mEq/L (ref 3.5–5.1)

## 2012-04-09 MED ORDER — ERYTHROMYCIN 5 MG/GM OP OINT: TOPICAL_OINTMENT | OPHTHALMIC | Status: DC

## 2012-04-09 NOTE — Patient Instructions (Addendum)
Take all new medications as prescribed Continue all other medications as before, including the home oxygen Please go to LAB in the Basement for the blood and/or urine tests to be done today You will be contacted by phone if any changes need to be made immediately.  Otherwise, you will receive a letter about your results with an explanation. Please return in 6 months, or sooner if needed

## 2012-04-10 ENCOUNTER — Encounter: Payer: Self-pay | Admitting: Internal Medicine

## 2012-04-10 NOTE — Progress Notes (Signed)
Subjective:    Patient ID: [name], female    DOB: May 11, 1934, 78 y.o.   MRN: [id number]  HPI  Here to f/u with son present, remains supportive; overall doing ok,  Pt denies chest pain, increased sob or doe, wheezing, orthopnea, PND, increased LE swelling, palpitations, dizziness or syncope, though does have ongoing edema, today mild left slightly > right.  Pt denies new neurological symptoms such as new headache, or facial or extremity weakness or numbness   Pt denies polydipsia, polyuria, or low sugar symptoms such as weakness or confusion improved with po intake.  Pt states overall good compliance with meds, trying to follow lower cholesterol, diabetic diet.  Followed closely per cardiology for CHF.  Did have a fall in April with trying to weigh in the BR and slipped on tile, seen in ER, no apparent injury, cbc/cmet ok.  Does incidentally have 2-3 days onset right eye red, burning without fever but with mild d/c, no blurred vision.  Also asked per Home Health to f/u today with her yearly O2 requirement check - O2 sat with oxygen 92% at rest, then 85% only without o2 at rest.   Past Medical History  Diagnosis Date  . GERD 06/03/2007  . HYPERTENSION 06/03/2007  . OSTEOARTHRITIS 06/03/2007  . PEPTIC ULCER DISEASE 06/03/2007  . TRANSIENT ISCHEMIC ATTACK, HX OF 06/03/2007  . CONGESTIVE HEART FAILURE 06/03/2007    -2d echo 03/19/07 mild diastolic dysfunction, nl LA, RV  . DIABETES MELLITUS, TYPE II 06/03/2007  . LOW BACK PAIN 06/03/2007  . COPD 06/03/2007    Dr Ocie Doyne  . DEPRESSION 06/03/2007  . 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 Atrial fibrillation 08/10/2007  . HEMORRHOIDS 08/08/2009  . CONSTIPATION, INTERMITTENT 02/15/2010  . DYSPNEA/SHORTNESS OF BREATH 06/09/2008  . INTERSTITIAL LUNG DISEASE 08/10/2007    -onset 12/03 with definite steroid responsive component -PFT's October 11, 2009: VC 67%, no airflow obstruction DLC0 14%  . Allergic rhinitis   . Diverticulosis 05/11/2011  .  Goiter 05/11/2011  . Shingles 05/11/2011   Past Surgical History  Procedure Date  . Stomach tumors removed 08/22/1991  . Abdominal hysterectomy 10/31/1983    TAH    reports that she has never smoked. She has never used smokeless tobacco. She reports that she does not drink alcohol or use illicit drugs. family history includes Cancer in her sister and Hypertension in her father and mother. Allergies  Allergen Reactions  . Levofloxacin Nausea Only    REACTION: nausea  . Ultram (Tramadol Hcl) Nausea Only   Current Outpatient Prescriptions on File Prior to Visit  Medication Sig Dispense Refill  . aspirin (ECOTRIN LOW STRENGTH) 81 MG EC tablet Take 81 mg by mouth daily.        . clotrimazole-betamethasone (LOTRISONE) cream        . fexofenadine (ALLEGRA) 180 MG tablet Take 180 mg by mouth daily.        . furosemide (LASIX) 20 MG tablet 3 tablets by mouth two times a day  180 tablet  11  . glimepiride (AMARYL) 2 MG tablet Take 1 tablet (2 mg total) by mouth every morning.  30 tablet  11  . KLOR-CON M20 20 MEQ tablet TAKE 3 TABLETS DAILY  90 tablet  3  . lovastatin (MEVACOR) 40 MG tablet Take 1 tablet (40 mg total) by mouth daily.  30 tablet  11  . Multiple Vitamin (MULTIVITAMIN) tablet Take 1 tablet by mouth daily.        Marland Kitchen  nebivolol (BYSTOLIC) 10 MG tablet Take 1 tablet (10 mg total) by mouth daily.  30 tablet  11  . omeprazole (PRILOSEC) 20 MG capsule TAKE ONE (1) CAPSULE EACH DAY  30 capsule  6  . ondansetron (ZOFRAN) 4 MG tablet Take 1 tablet (4 mg total) by mouth daily as needed for nausea.  30 tablet  1  . predniSONE (DELTASONE) 10 MG tablet TAKE ONE (1) TABLET EACH DAY  30 tablet  5  . SPIRIVA HANDIHALER 18 MCG inhalation capsule INHALE CONTENTS OF ONE CAPSULE IN       HANDIHALER ONCE DAILY  30 each  3  . traMADol (ULTRAM) 50 MG tablet Take 50 mg by mouth every 6 (six) hours as needed.       Review of Systems Review of Systems  Constitutional: Negative for diaphoresis and unexpected  weight change.  HENT: Negative for  tinnitus.   Eyes: Negative for photophobia and visual disturbance.  Respiratory: Negative for choking and stridor.   Gastrointestinal: Negative for vomiting and blood in stool.  Genitourinary: Negative for hematuria and decreased urine volume.   Skin: Negative for color change and wound.  Neurological: Negative for tremors and numbness.  Psychiatric/Behavioral: Negative for decreased concentration. The patient is not hyperactive.      Objective:   Physical Exam BP 110/78  Pulse 80  Temp 99.4 F (37.4 C) (Oral)  SpO2 92% Physical Exam  VS noted Constitutional: Pt appears well-developed and well-nourished.  HENT: Head: Normocephalic.  Right Ear: External ear normal.  Left Ear: External ear normal.  Eyes: right Conjunctivae with 1+ red/swelling and mild d/c, and EOM are normal. Pupils are equal, round, and reactive to light.  Neck: Normal range of motion. Neck supple.  Cardiovascular: Normal rate and regular rhythm.   Pulmonary/Chest: Effort normal and breath sounds without rales or wheezing Neurological: Pt is alert. No cranial nerve deficit.  Skin: Skin is warm. No erythema. trace to 1+ LE edema left > right to knees Psychiatric: Pt behavior is normal. Thought content normal.     Assessment & Plan:

## 2012-04-10 NOTE — Assessment & Plan Note (Signed)
Mild to mod, for antibx course,  to f/u any worsening symptoms or concerns 

## 2012-04-10 NOTE — Assessment & Plan Note (Signed)
stable overall by hx and exam, most recent data reviewed with pt, and pt to continue medical treatment as before Lab Results  Component Value Date   HGBA1C 6.8* 04/09/2012

## 2012-04-10 NOTE — Assessment & Plan Note (Signed)
For f/u cbc today, no overt bleeding or bruising,  to f/u any worsening symptoms or concerns

## 2012-04-10 NOTE — Assessment & Plan Note (Signed)
Documented ongoing need for O2 today as per home health o/w stable overall by hx and exam, most recent data reviewed with pt, and pt to continue medical treatment as before SpO2 Readings from Last 3 Encounters:  04/09/12 92%  01/26/12 100%  12/26/11 100%

## 2012-04-10 NOTE — Assessment & Plan Note (Signed)
stable overall by hx and exam, most recent data reviewed with pt, and pt to continue medical treatment as before BP Readings from Last 3 Encounters:  04/09/12 110/78  01/26/12 128/66  01/02/12 122/68

## 2012-04-22 ENCOUNTER — Other Ambulatory Visit: Payer: Self-pay | Admitting: Internal Medicine

## 2012-05-14 ENCOUNTER — Other Ambulatory Visit: Payer: Self-pay | Admitting: Internal Medicine

## 2012-06-16 ENCOUNTER — Other Ambulatory Visit: Payer: Self-pay | Admitting: Internal Medicine

## 2012-06-23 ENCOUNTER — Other Ambulatory Visit: Payer: Self-pay | Admitting: Internal Medicine

## 2012-07-07 ENCOUNTER — Other Ambulatory Visit: Payer: Self-pay | Admitting: Internal Medicine

## 2012-07-07 MED ORDER — GLIMEPIRIDE 2 MG PO TABS: 2.0000 mg | ORAL_TABLET | Freq: Every day | ORAL | Status: DC

## 2012-07-07 NOTE — Addendum Note (Signed)
Addended by: Scharlene Gloss B on: 07/07/2012 03:37 PM   Modules accepted: Orders

## 2012-07-13 ENCOUNTER — Other Ambulatory Visit: Payer: Self-pay

## 2012-07-13 MED ORDER — GLIMEPIRIDE 2 MG PO TABS: 2.0000 mg | ORAL_TABLET | Freq: Every day | ORAL | Status: DC

## 2012-08-13 ENCOUNTER — Other Ambulatory Visit: Payer: Self-pay | Admitting: Internal Medicine

## 2012-08-24 ENCOUNTER — Other Ambulatory Visit: Payer: Self-pay | Admitting: Internal Medicine

## 2012-09-10 ENCOUNTER — Other Ambulatory Visit: Payer: Self-pay | Admitting: Internal Medicine

## 2012-09-24 ENCOUNTER — Other Ambulatory Visit: Payer: Self-pay | Admitting: Internal Medicine

## 2012-09-30 ENCOUNTER — Ambulatory Visit: Payer: Medicare Other | Admitting: Internal Medicine

## 2012-10-01 ENCOUNTER — Encounter: Payer: Self-pay | Admitting: Internal Medicine

## 2012-10-01 ENCOUNTER — Ambulatory Visit (INDEPENDENT_AMBULATORY_CARE_PROVIDER_SITE_OTHER): Payer: Medicare Other | Admitting: Internal Medicine

## 2012-10-01 ENCOUNTER — Telehealth: Payer: Self-pay | Admitting: Internal Medicine

## 2012-10-01 VITALS — BP 120/82 | HR 71 | Temp 98.6°F

## 2012-10-01 DIAGNOSIS — R269 Unspecified abnormalities of gait and mobility: Secondary | ICD-10-CM

## 2012-10-01 DIAGNOSIS — E119 Type 2 diabetes mellitus without complications: Secondary | ICD-10-CM

## 2012-10-01 DIAGNOSIS — I10 Essential (primary) hypertension: Secondary | ICD-10-CM

## 2012-10-01 DIAGNOSIS — H538 Other visual disturbances: Secondary | ICD-10-CM

## 2012-10-01 DIAGNOSIS — I509 Heart failure, unspecified: Secondary | ICD-10-CM

## 2012-10-01 DIAGNOSIS — J8410 Pulmonary fibrosis, unspecified: Secondary | ICD-10-CM

## 2012-10-01 DIAGNOSIS — R21 Rash and other nonspecific skin eruption: Secondary | ICD-10-CM

## 2012-10-01 DIAGNOSIS — Z23 Encounter for immunization: Secondary | ICD-10-CM

## 2012-10-01 NOTE — Progress Notes (Signed)
Subjective:    Patient ID: [name], female    DOB: 01-01-1934, 79 y.o.   MRN: [id number]  HPI  Here to f/u; overall doing ok but has been deteriorating recently with general weakness and debility, son here with her as good support again.    Pt denies chest pain, increased sob or doe, wheezing, orthopnea, PND, increased LE swelling, palpitations, dizziness or syncope; leg swelling remains stable..  Pt denies polydipsia, polyuria, or low sugar symptoms such as weakness or confusion improved with po intake.  Pt denies new neurological symptoms such as new headache, or facial or extremity weakness or numbness.   Pt states overall good compliance with meds, has been trying to follow lower cholesterol, diabetic diet, with overall stable, in wheelchair today to assist with movement through the office today.  On home o2, does not have regular pulm f/u.  Also with recent worsening bilat vision blurring without pain, fever, in the past few wks for unclear reasons.  Also with sitting more lately now with a small sore to left buttock without cellultiis Past Medical History  Diagnosis Date  . GERD 06/03/2007  . HYPERTENSION 06/03/2007  . OSTEOARTHRITIS 06/03/2007  . PEPTIC ULCER DISEASE 06/03/2007  . TRANSIENT ISCHEMIC ATTACK, HX OF 06/03/2007  . CONGESTIVE HEART FAILURE 06/03/2007    -2d echo 03/19/07 mild diastolic dysfunction, nl LA, RV  . DIABETES MELLITUS, TYPE II 06/03/2007  . LOW BACK PAIN 06/03/2007  . COPD 06/03/2007    Dr Ocie Doyne  . DEPRESSION 06/03/2007  . 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 Atrial fibrillation 08/10/2007  . HEMORRHOIDS 08/08/2009  . CONSTIPATION, INTERMITTENT 02/15/2010  . DYSPNEA/SHORTNESS OF BREATH 06/09/2008  . INTERSTITIAL LUNG DISEASE 08/10/2007    -onset 12/03 with definite steroid responsive component -PFT's October 11, 2009: VC 67%, no airflow obstruction DLC0 14%  . Allergic rhinitis   . Diverticulosis 05/11/2011  . Goiter 05/11/2011  . Shingles 05/11/2011     Past Surgical History  Procedure Date  . Stomach tumors removed 08/22/1991  . Abdominal hysterectomy 10/31/1983    TAH    reports that she has never smoked. She has never used smokeless tobacco. She reports that she does not drink alcohol or use illicit drugs. family history includes Cancer in her sister and Hypertension in her father and mother. Allergies  Allergen Reactions  . Levofloxacin Nausea Only    REACTION: nausea  . Ultram (Tramadol Hcl) Nausea Only   Current Outpatient Prescriptions on File Prior to Visit  Medication Sig Dispense Refill  . aspirin (ECOTRIN LOW STRENGTH) 81 MG EC tablet Take 81 mg by mouth daily.        Marland Kitchen BYSTOLIC 10 MG tablet TAKE 1 TABLET EACH DAY  30 each  4  . clotrimazole-betamethasone (LOTRISONE) cream        . erythromycin ophthalmic ointment Use as directed four times per day to right eye  3.5 g  0  . fexofenadine (ALLEGRA) 180 MG tablet Take 180 mg by mouth daily.        . furosemide (LASIX) 20 MG tablet TAKE THREE TABLETS (60MG )BY MOUTH TWO TIMES A DAY.  180 tablet  4  . glimepiride (AMARYL) 2 MG tablet TAKE 1 TABLET EACH       MORNING  30 tablet  11  . KLOR-CON M20 20 MEQ tablet TAKE 3 TABLETS DAILY  90 each  3  . lovastatin (MEVACOR) 40 MG tablet TAKE 1 TABLET EACH  EVENING  30 tablet  6  . Multiple Vitamin (MULTIVITAMIN) tablet Take 1 tablet by mouth daily.        Marland Kitchen omeprazole (PRILOSEC) 20 MG capsule TAKE ONE (1) CAPSULE EACHDAY  30 capsule  11  . predniSONE (DELTASONE) 10 MG tablet take 1 tablet by mouth once daily  30 tablet  3  . SPIRIVA HANDIHALER 18 MCG inhalation capsule INHALE CONTENTS OF ONE   CAPSULE IN HANDIHALER    ONCE DAILY  30 capsule  11  . traMADol (ULTRAM) 50 MG tablet Take 50 mg by mouth every 6 (six) hours as needed.       Review of Systems  Constitutional: Negative for unexpected weight change, or unusual diaphoresis  HENT: Negative for tinnitus.   Eyes: Negative for photophobia and visual disturbance.   Respiratory: Negative for choking and stridor.   Gastrointestinal: Negative for vomiting and blood in stool.  Genitourinary: Negative for hematuria and decreased urine volume.  Musculoskeletal: Negative for acute joint swelling Skin: Negative for color change Neurological: Negative for tremors and numbness other than noted  Psychiatric/Behavioral: Negative for decreased concentration or  hyperactivity.       Objective:   Physical Exam BP 120/82  Pulse 71  Temp 98.6 F (37 C) (Oral) Physical Exam  VS noted, not acutely ill appearing Constitutional: Pt appears well-developed and well-nourished.  HENT: Head: NCAT.  Right Ear: External ear normal.  Left Ear: External ear normal.  Eyes: Conjunctivae and EOM are normal. Pupils are equal, round, and reactive to light.  Neck: Normal range of motion. Neck supple.  Cardiovascular: Normal rate and irregular rhythm.   Pulmonary/Chest: Effort normal and breath sounds decreased, no rales or wheezing Abd:  Soft, NT, non-distended, + BS Neurological: Pt is alert. Not confused  Skin: Skin is warm. No erythema. Small superfic ulceration left buttock noted, chronic bilat trace -1+ edema to mid calf Psychiatric: Pt behavior is normal. Thought content normal. Not depressed affect    Assessment & Plan:

## 2012-10-01 NOTE — Telephone Encounter (Signed)
They can try a barrier cream such as desitin, but o/w I would need to refer to dermatology

## 2012-10-01 NOTE — Telephone Encounter (Signed)
Daughter [name] is calling in on behalf of her mother. She states Dr. Jonny Ruiz sent a prescription to the [facility] pharmacy for a "patch" to put on her bedsore on buttocks. Daughter states this "patch" is something they do not want and would prefer a cream instead and is asking for the medication to be changed.  Unable to see any H&P not or new medication under Med Tab.  Pharmacy is [facility] on [address].  Please contact pharmacy and family.  [phone]

## 2012-10-01 NOTE — Patient Instructions (Addendum)
Continue all other medications as before Please go to LAB in the Basement for the blood and/or urine tests to be done today You will be contacted by phone if any changes need to be made immediately.  Otherwise, you will receive a letter about your results with an explanation, but please check with MyChart first. Thank you for enrolling in MyChart. Please follow the instructions below to securely access your online medical record. MyChart allows you to send messages to your doctor, view your test results, renew your prescriptions, schedule appointments, and more To Log into MyChart, please go to [email or web address], and your Username is: [email or web address] You are given the handicap parking form filled out today You will be contacted regarding the referral for: Pulmonary and Opthomology (eye doctor) Please use duoderm for the sore until healed Please return in 6 months, or sooner if needed

## 2012-10-01 NOTE — Telephone Encounter (Signed)
Family informed.

## 2012-10-02 ENCOUNTER — Encounter: Payer: Self-pay | Admitting: Internal Medicine

## 2012-10-02 DIAGNOSIS — R21 Rash and other nonspecific skin eruption: Secondary | ICD-10-CM | POA: Insufficient documentation

## 2012-10-02 NOTE — Assessment & Plan Note (Signed)
stable overall by history and exam, recent data reviewed with pt, and pt to continue medical treatment as before,  to f/u any worsening symptoms or concerns Lab Results  Component Value Date   HGBA1C 6.8* 04/09/2012

## 2012-10-02 NOTE — Assessment & Plan Note (Signed)
Ok for pulm eval,cont same tx,  to f/u any worsening symptoms or concerns

## 2012-10-02 NOTE — Assessment & Plan Note (Signed)
stable overall by history and exam, recent data reviewed with pt, and pt to continue medical treatment as before,  to f/u any worsening symptoms or concerns BP Readings from Last 3 Encounters:  10/01/12 120/82  04/09/12 110/78  01/26/12 128/66  for f/u bmet today, also to assess cr

## 2012-10-02 NOTE — Assessment & Plan Note (Addendum)
With general worsening debility it seems, for home health/pt/ot and refer [facility] to assess for any needs, son remains supportive, handicap parking form filled out  Note:  Total time for pt hx, exam, review of record with pt in the room, determination of diagnoses and plan for further eval and tx is > 40 min, with over 50% spent in coordination and counseling of patient

## 2012-10-02 NOTE — Assessment & Plan Note (Signed)
For optho eval , ? Dm related vs other -

## 2012-10-02 NOTE — Assessment & Plan Note (Signed)
For duoderm asd,  to f/u any worsening symptoms or concerns  

## 2012-10-20 ENCOUNTER — Institutional Professional Consult (permissible substitution): Payer: Medicare Other | Admitting: Internal Medicine

## 2012-10-22 ENCOUNTER — Telehealth: Payer: Self-pay

## 2012-10-22 NOTE — Telephone Encounter (Signed)
Done hardcopy to robin; glucerna better due to her hx of sugar

## 2012-10-22 NOTE — Telephone Encounter (Signed)
[facility] has called requesting compression stockings for this patient.  They do Need a SCRIPT faxed to [facility] at [phone].  Called back number for [facility] is [phone] Elliot Cousin). Also they would like a script for  Polyithylend Glycol powder [facility].  Is it ok for this patient to take regular ensure because of cost? Which is better for her glucerna or ensure??

## 2012-10-22 NOTE — Telephone Encounter (Signed)
Faxed rx to [facility] and [employer].

## 2012-10-25 ENCOUNTER — Telehealth: Payer: Self-pay

## 2012-10-25 NOTE — Telephone Encounter (Signed)
Ok for verbal 

## 2012-10-25 NOTE — Telephone Encounter (Signed)
[facility]N informed 

## 2012-10-25 NOTE — Telephone Encounter (Signed)
HHRN called to inform both ankles are swollen.  Patient and son advised to elevate feet as much as possible.  HHRN needs verbal ok to change duo derm twice weekly on right buttock sore.  CAll back number [phone]

## 2012-10-28 ENCOUNTER — Telehealth: Payer: Self-pay

## 2012-10-28 NOTE — Telephone Encounter (Signed)
HHRN needs a verbal to bathe the patient 2 to 3 times per week.  Call back number [phone]

## 2012-10-28 NOTE — Telephone Encounter (Signed)
Ok for verbal 

## 2012-10-29 NOTE — Telephone Encounter (Signed)
Called [facility] left detailed message of MD's verbal ok.

## 2012-11-02 ENCOUNTER — Telehealth: Payer: Self-pay | Admitting: Internal Medicine

## 2012-11-02 NOTE — Telephone Encounter (Signed)
Ok for verbal 

## 2012-11-02 NOTE — Telephone Encounter (Signed)
Junious Dresser, Occupational Therapist with [facility] is calling to get a verbal order for OT 2x a week for 5 weeks, call Junious Dresser at [phone]

## 2012-11-02 NOTE — Telephone Encounter (Signed)
[facility] informed of MD ok.

## 2012-11-10 ENCOUNTER — Telehealth: Payer: Self-pay

## 2012-11-10 NOTE — Telephone Encounter (Signed)
[facility] RN Elliot Cousin) was out to see the patient today.  She would like to know if ok for the patient to take OTC Mucinex for congestion.  Also [facility] would like a prescription for compression hose faxed to [phone] ATTN Elliot Cousin.  Please advise

## 2012-11-10 NOTE — Telephone Encounter (Signed)
Ok for mucinex prn  Also rx for compression stocking  - Done hardcopy to [employer]

## 2012-11-10 NOTE — Telephone Encounter (Signed)
Faxed rx for compression stockings to [facility] ([place]) and then called the family to inform ok to take mucinex prn.

## 2012-11-11 ENCOUNTER — Telehealth: Payer: Self-pay

## 2012-11-11 MED ORDER — HYDROCODONE-ACETAMINOPHEN 5-325 MG PO TABS: 1.0000 | ORAL_TABLET | Freq: Four times a day (QID) | ORAL | Status: DC | PRN

## 2012-11-11 NOTE — Telephone Encounter (Signed)
Called [place] informed and faxed hardcopy

## 2012-11-11 NOTE — Telephone Encounter (Signed)
I assume the pain she refers to involves her chronic arthritis  OK for trial hydrocodone 5 325 prn - Done hardcopy to robin

## 2012-11-11 NOTE — Telephone Encounter (Signed)
[facility] called to inform the patient is having severe pain a 9 out of 10.  States the tramadol is not working.  Please advise.  Call back to [place] office at [phone] Lesli Albee)

## 2012-11-12 ENCOUNTER — Other Ambulatory Visit: Payer: Self-pay | Admitting: Internal Medicine

## 2012-11-23 ENCOUNTER — Other Ambulatory Visit: Payer: Self-pay | Admitting: Internal Medicine

## 2013-01-10 ENCOUNTER — Other Ambulatory Visit: Payer: Self-pay | Admitting: Internal Medicine

## 2013-03-04 ENCOUNTER — Telehealth: Payer: Self-pay

## 2013-03-04 DIAGNOSIS — Z0279 Encounter for issue of other medical certificate: Secondary | ICD-10-CM

## 2013-03-04 NOTE — Telephone Encounter (Signed)
[facility] informed

## 2013-03-04 NOTE — Telephone Encounter (Signed)
Ok for verbal 

## 2013-03-04 NOTE — Telephone Encounter (Signed)
[facility] Elliot Cousin is requesting GENERIC miralax (pharmacy) for constipation for this patient as would be $1.  [facility] has educated the family on the importance of keeping the patient regular, but does not something sent in.  Call back number is [phone]

## 2013-03-08 ENCOUNTER — Telehealth: Payer: Self-pay | Admitting: Internal Medicine

## 2013-03-08 MED ORDER — POLYETHYLENE GLYCOL 3350 17 GM/SCOOP PO POWD: 17.0000 g | Freq: Two times a day (BID) | ORAL | Status: DC | PRN

## 2013-03-08 NOTE — Telephone Encounter (Signed)
[name] called to say the Miralax has not gotten to her pharmacy [facility])  She is still constipated.  Please send an RX to [facility].

## 2013-03-08 NOTE — Telephone Encounter (Signed)
rx sent in and called the family to inform

## 2013-03-28 ENCOUNTER — Telehealth: Payer: Self-pay

## 2013-03-28 ENCOUNTER — Other Ambulatory Visit: Payer: Self-pay | Admitting: Internal Medicine

## 2013-03-28 NOTE — Telephone Encounter (Signed)
[facility] Elliot Cousin called requesting last a1c for this patient and date.  Informed [facility] last a1c was 03/30/12 and was 6.8.

## 2013-04-06 ENCOUNTER — Telehealth: Payer: Self-pay

## 2013-04-06 NOTE — Telephone Encounter (Signed)
Ok for verbal 

## 2013-04-06 NOTE — Telephone Encounter (Signed)
[facility]) is requesting a verbal ok for aid to go out a couple times per week to bathe the patient.  Call back number [phone].

## 2013-04-07 NOTE — Telephone Encounter (Signed)
Called to inform but no answer and no VM

## 2013-04-07 NOTE — Telephone Encounter (Signed)
[facility]N informed 

## 2013-04-14 ENCOUNTER — Other Ambulatory Visit: Payer: Self-pay | Admitting: Internal Medicine

## 2013-04-17 ENCOUNTER — Emergency Department (HOSPITAL_COMMUNITY): Payer: Medicare Other

## 2013-04-17 ENCOUNTER — Encounter (HOSPITAL_COMMUNITY): Payer: Self-pay | Admitting: Emergency Medicine

## 2013-04-17 ENCOUNTER — Inpatient Hospital Stay (HOSPITAL_COMMUNITY)
Admission: EM | Admit: 2013-04-17 | Discharge: 2013-04-20 | DRG: 194 | Disposition: A | Discharge status: Home Health | Payer: Medicare Other | Attending: Internal Medicine | Admitting: Internal Medicine

## 2013-04-17 DIAGNOSIS — R21 Rash and other nonspecific skin eruption: Secondary | ICD-10-CM

## 2013-04-17 DIAGNOSIS — R531 Weakness: Secondary | ICD-10-CM

## 2013-04-17 DIAGNOSIS — E441 Mild protein-calorie malnutrition: Secondary | ICD-10-CM

## 2013-04-17 DIAGNOSIS — Z8679 Personal history of other diseases of the circulatory system: Secondary | ICD-10-CM

## 2013-04-17 DIAGNOSIS — E669 Obesity, unspecified: Secondary | ICD-10-CM | POA: Diagnosis present

## 2013-04-17 DIAGNOSIS — I4891 Unspecified atrial fibrillation: Secondary | ICD-10-CM

## 2013-04-17 DIAGNOSIS — Z79899 Other long term (current) drug therapy: Secondary | ICD-10-CM

## 2013-04-17 DIAGNOSIS — I10 Essential (primary) hypertension: Secondary | ICD-10-CM

## 2013-04-17 DIAGNOSIS — R5383 Other fatigue: Secondary | ICD-10-CM | POA: Diagnosis present

## 2013-04-17 DIAGNOSIS — Z8673 Personal history of transient ischemic attack (TIA), and cerebral infarction without residual deficits: Secondary | ICD-10-CM

## 2013-04-17 DIAGNOSIS — E049 Nontoxic goiter, unspecified: Secondary | ICD-10-CM | POA: Diagnosis present

## 2013-04-17 DIAGNOSIS — I129 Hypertensive chronic kidney disease with stage 1 through stage 4 chronic kidney disease, or unspecified chronic kidney disease: Secondary | ICD-10-CM | POA: Diagnosis present

## 2013-04-17 DIAGNOSIS — J189 Pneumonia, unspecified organism: Principal | ICD-10-CM

## 2013-04-17 DIAGNOSIS — M1990 Unspecified osteoarthritis, unspecified site: Secondary | ICD-10-CM

## 2013-04-17 DIAGNOSIS — Z6834 Body mass index (BMI) 34.0-34.9, adult: Secondary | ICD-10-CM

## 2013-04-17 DIAGNOSIS — N189 Chronic kidney disease, unspecified: Secondary | ICD-10-CM

## 2013-04-17 DIAGNOSIS — F329 Major depressive disorder, single episode, unspecified: Secondary | ICD-10-CM

## 2013-04-17 DIAGNOSIS — D631 Anemia in chronic kidney disease: Secondary | ICD-10-CM

## 2013-04-17 DIAGNOSIS — R269 Unspecified abnormalities of gait and mobility: Secondary | ICD-10-CM

## 2013-04-17 DIAGNOSIS — I5032 Chronic diastolic (congestive) heart failure: Secondary | ICD-10-CM

## 2013-04-17 DIAGNOSIS — J4489 Other specified chronic obstructive pulmonary disease: Secondary | ICD-10-CM

## 2013-04-17 DIAGNOSIS — H538 Other visual disturbances: Secondary | ICD-10-CM

## 2013-04-17 DIAGNOSIS — R5381 Other malaise: Secondary | ICD-10-CM | POA: Diagnosis present

## 2013-04-17 DIAGNOSIS — K219 Gastro-esophageal reflux disease without esophagitis: Secondary | ICD-10-CM

## 2013-04-17 DIAGNOSIS — N184 Chronic kidney disease, stage 4 (severe): Secondary | ICD-10-CM

## 2013-04-17 DIAGNOSIS — J8410 Pulmonary fibrosis, unspecified: Secondary | ICD-10-CM

## 2013-04-17 DIAGNOSIS — R627 Adult failure to thrive: Secondary | ICD-10-CM | POA: Diagnosis present

## 2013-04-17 DIAGNOSIS — I214 Non-ST elevation (NSTEMI) myocardial infarction: Secondary | ICD-10-CM

## 2013-04-17 DIAGNOSIS — N289 Disorder of kidney and ureter, unspecified: Secondary | ICD-10-CM

## 2013-04-17 DIAGNOSIS — J449 Chronic obstructive pulmonary disease, unspecified: Secondary | ICD-10-CM

## 2013-04-17 DIAGNOSIS — K279 Peptic ulcer, site unspecified, unspecified as acute or chronic, without hemorrhage or perforation: Secondary | ICD-10-CM

## 2013-04-17 DIAGNOSIS — I5022 Chronic systolic (congestive) heart failure: Secondary | ICD-10-CM

## 2013-04-17 DIAGNOSIS — Z9981 Dependence on supplemental oxygen: Secondary | ICD-10-CM

## 2013-04-17 DIAGNOSIS — Z7982 Long term (current) use of aspirin: Secondary | ICD-10-CM

## 2013-04-17 DIAGNOSIS — R509 Fever, unspecified: Secondary | ICD-10-CM

## 2013-04-17 DIAGNOSIS — K649 Unspecified hemorrhoids: Secondary | ICD-10-CM

## 2013-04-17 DIAGNOSIS — E119 Type 2 diabetes mellitus without complications: Secondary | ICD-10-CM

## 2013-04-17 DIAGNOSIS — D3A092 Benign carcinoid tumor of the stomach: Secondary | ICD-10-CM

## 2013-04-17 DIAGNOSIS — I509 Heart failure, unspecified: Secondary | ICD-10-CM | POA: Diagnosis present

## 2013-04-17 DIAGNOSIS — F3289 Other specified depressive episodes: Secondary | ICD-10-CM

## 2013-04-17 DIAGNOSIS — K5900 Constipation, unspecified: Secondary | ICD-10-CM | POA: Diagnosis present

## 2013-04-17 LAB — TROPONIN I: Troponin I: <0.3 ng/mL (ref <0.30)

## 2013-04-17 LAB — URINE MICROSCOPIC-ADD ON

## 2013-04-17 LAB — COMPREHENSIVE METABOLIC PANEL
AST: 20 U/L (ref 0–37)
CO2: 31 mEq/L (ref 19–32)
Calcium: 8.7 mg/dL (ref 8.4–10.5)
Creatinine, Ser: 1.92 mg/dL — ABNORMAL HIGH (ref 0.50–1.10)
GFR calc non Af Amer: 24 mL/min — ABNORMAL LOW (ref >90)
Total Protein: 6.5 g/dL (ref 6.0–8.3)

## 2013-04-17 LAB — URINALYSIS, ROUTINE W REFLEX MICROSCOPIC
Nitrite: NEGATIVE
Specific Gravity, Urine: 1.02 (ref 1.005–1.030)
Urobilinogen, UA: 1 mg/dL (ref 0.0–1.0)
pH: 5 (ref 5.0–8.0)

## 2013-04-17 LAB — CBC
MCH: 28.7 pg (ref 26.0–34.0)
MCHC: 31.5 g/dL (ref 30.0–36.0)
MCV: 91.2 fL (ref 78.0–100.0)
Platelets: 154 10*3/uL (ref 150–400)
RBC: 3.76 MIL/uL — ABNORMAL LOW (ref 3.87–5.11)
RDW: 13.9 % (ref 11.5–15.5)

## 2013-04-17 MED ORDER — DEXTROSE 5 % IV SOLN
1.0000 g | INTRAVENOUS | Status: DC
  Administered 2013-04-17 – 2013-04-18 (×2): 1 g via INTRAVENOUS
  Filled 2013-04-17 (×2): qty 10

## 2013-04-17 MED ORDER — AZITHROMYCIN 250 MG PO TABS
500.0000 mg | ORAL_TABLET | Freq: Once | ORAL | Status: AC
  Administered 2013-04-17: 500 mg via ORAL
  Filled 2013-04-17: qty 2

## 2013-04-17 MED ORDER — SODIUM CHLORIDE 0.9 % IV SOLN
INTRAVENOUS | Status: DC
  Administered 2013-04-17: 22:00:00 via INTRAVENOUS
  Administered 2013-04-20: 1000 mL via INTRAVENOUS

## 2013-04-17 NOTE — ED Notes (Signed)
Generalized weakness and diarrhea x1 day Pt states "I just don't feel good".

## 2013-04-17 NOTE — ED Provider Notes (Addendum)
CSN: [id number]     Arrival date & time 04/17/13  2038 History     First MD Initiated Contact with Patient 04/17/13 2054     Chief Complaint  Patient presents with  . Weakness   (Consider location/radiation/quality/duration/timing/severity/associated sxs/prior Treatment) Patient is a 79 y.o. female presenting with weakness. The history is provided by the patient.  Weakness Pertinent negatives include no chest pain, no abdominal pain, no headaches and no shortness of breath.  pt w hx htn, copd, niddm, c/o generally feeling weak for the past 2 days, subjective fever. Moderate. Pt unaware of specific exacerbating or alleviating factors. Denies chills/sweats. Occasional non productive cough. No sore throat, sinus drainage, or other uri c/o. Pt denies headache. No cp or discomfort. No sob. No abd pain. No nvd. No dysuria or gu c/o. Denies any recent change in meds, states compliant w normal meds. No focal or unilateral numbness or weakness. Denies any recent trauma or fall.   Family arrives, additional hx, they note fever, cough, some increased wob, and generalized weakness. They state normally on 2-3 liters o2 [place], but seems more dyspneic on her normal o2.    Past Medical History  Diagnosis Date  . GERD 06/03/2007  . HYPERTENSION 06/03/2007  . OSTEOARTHRITIS 06/03/2007  . PEPTIC ULCER DISEASE 06/03/2007  . TRANSIENT ISCHEMIC ATTACK, HX OF 06/03/2007  . CONGESTIVE HEART FAILURE 06/03/2007    -2d echo 03/19/07 mild diastolic dysfunction, nl LA, RV  . DIABETES MELLITUS, TYPE II 06/03/2007  . LOW BACK PAIN 06/03/2007  . COPD 06/03/2007    Dr Ocie Doyne  . DEPRESSION 06/03/2007  . 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 Atrial fibrillation 08/10/2007  . HEMORRHOIDS 08/08/2009  . CONSTIPATION, INTERMITTENT 02/15/2010  . DYSPNEA/SHORTNESS OF BREATH 06/09/2008  . INTERSTITIAL LUNG DISEASE 08/10/2007    -onset 12/03 with definite steroid responsive component -PFT's October 11, 2009: VC 67%,  no airflow obstruction DLC0 14%  . Allergic rhinitis   . Diverticulosis 05/11/2011  . Goiter 05/11/2011  . Shingles 05/11/2011   Past Surgical History  Procedure Laterality Date  . Stomach tumors removed  08/22/1991  . Abdominal hysterectomy  10/31/1983    TAH   Family History  Problem Relation Age of Onset  . Hypertension Mother   . Hypertension Father   . Cancer Sister     Breast Cancer, Liver Cancer   History  Substance Use Topics  . Smoking status: Never Smoker   . Smokeless tobacco: Never Used  . Alcohol Use: No   OB History   Grav Para Term Preterm Abortions TAB SAB Ect Mult Living                 Review of Systems  Constitutional: Negative for chills.  HENT: Negative for sore throat, neck pain and neck stiffness.   Eyes: Negative for redness.  Respiratory: Negative for shortness of breath.   Cardiovascular: Negative for chest pain.  Gastrointestinal: Negative for vomiting, abdominal pain, diarrhea and blood in stool.  Genitourinary: Negative for dysuria and flank pain.  Musculoskeletal: Negative for back pain.  Skin: Negative for rash.  Neurological: Positive for weakness. Negative for headaches.  Hematological: Does not bruise/bleed easily.  Psychiatric/Behavioral: Negative for confusion.    Allergies  Levofloxacin and Ultram  Home Medications   Current Outpatient Rx  Name  Route  Sig  Dispense  Refill  . aspirin (ECOTRIN LOW STRENGTH) 81 MG EC tablet   Oral   Take 81 mg by mouth daily.           Marland Kitchen  BYSTOLIC 10 MG tablet      take 1 tablet by mouth once daily   30 tablet   11   . clotrimazole-betamethasone (LOTRISONE) cream                . erythromycin ophthalmic ointment      Use as directed four times per day to right eye   3.5 g   0   . fexofenadine (ALLEGRA) 180 MG tablet   Oral   Take 180 mg by mouth daily.           . furosemide (LASIX) 20 MG tablet      take 3 tablets by mouth twice a day   180 tablet   4   .  glimepiride (AMARYL) 2 MG tablet      TAKE 1 TABLET EACH       MORNING   30 tablet   11   . HYDROcodone-acetaminophen (NORCO/VICODIN) 5-325 MG per tablet   Oral   Take 1 tablet by mouth every 6 (six) hours as needed for pain. With limit 4 tabs per day   120 tablet   2   . KLOR-CON M20 20 MEQ tablet      take 3 tablets by mouth once daily   90 tablet   3   . lovastatin (MEVACOR) 40 MG tablet      TAKE 1 TABLET EACH       EVENING   30 tablet   6   . Multiple Vitamin (MULTIVITAMIN) tablet   Oral   Take 1 tablet by mouth daily.           Marland Kitchen omeprazole (PRILOSEC) 20 MG capsule      TAKE ONE (1) CAPSULE EACHDAY   30 capsule   11   . polyethylene glycol powder (GLYCOLAX/MIRALAX) powder   Oral   Take 17 g by mouth 2 (two) times daily as needed.   3350 g   11   . predniSONE (DELTASONE) 10 MG tablet      take 1 tablet by mouth once daily   30 tablet   3   . SPIRIVA HANDIHALER 18 MCG inhalation capsule      INHALE CONTENTS OF ONE   CAPSULE IN HANDIHALER    ONCE DAILY   30 capsule   11    BP 136/85  Pulse 85  Temp(Src) 99.2 F (37.3 C) (Oral)  Resp 22  SpO2 100% Physical Exam  Nursing note and vitals reviewed. Constitutional: She appears well-developed and well-nourished. No distress.  HENT:  Head: Atraumatic.  Nose: Nose normal.  Mouth/Throat: Oropharynx is clear and moist.  Eyes: Conjunctivae are normal. Pupils are equal, round, and reactive to light. No scleral icterus.  Neck: Neck supple. No tracheal deviation present. No thyromegaly present.  Cardiovascular: Normal rate, regular rhythm, normal heart sounds and intact distal pulses.  Exam reveals no gallop and no friction rub.   No murmur heard. Pulmonary/Chest: Effort normal. No respiratory distress.  Rhonchi bil.  Abdominal: Soft. Normal appearance and bowel sounds are normal. She exhibits no distension and no mass. There is no tenderness. There is no rebound and no guarding.  Genitourinary:  No  cva tenderness  Musculoskeletal: She exhibits no edema and no tenderness.  Neurological: She is alert. No cranial nerve deficit.  Speech clear/fluent. Motor intact bil.   Skin: Skin is warm and dry. No rash noted. She is not diaphoretic.  Psychiatric: She has a normal mood and affect.  ED Course   Procedures (including critical care time)  Results for orders placed during the hospital encounter of 04/17/13  CBC      Result Value Range   WBC 9.4  4.0 - 10.5 K/uL   RBC 3.76 (*) 3.87 - 5.11 MIL/uL   Hemoglobin 10.8 (*) 12.0 - 15.0 g/dL   HCT 47.8 (*) 29.5 - 62.1 %   MCV 91.2  78.0 - 100.0 fL   MCH 28.7  26.0 - 34.0 pg   MCHC 31.5  30.0 - 36.0 g/dL   RDW 30.8  65.7 - 84.6 %   Platelets 154  150 - 400 K/uL  COMPREHENSIVE METABOLIC PANEL      Result Value Range   Sodium 138  135 - 145 mEq/L   Potassium 4.3  3.5 - 5.1 mEq/L   Chloride 100  96 - 112 mEq/L   CO2 31  19 - 32 mEq/L   Glucose, Bld 157 (*) 70 - 99 mg/dL   BUN 35 (*) 6 - 23 mg/dL   Creatinine, Ser 9.62 (*) 0.50 - 1.10 mg/dL   Calcium 8.7  8.4 - 95.2 mg/dL   Total Protein 6.5  6.0 - 8.3 g/dL   Albumin 3.0 (*) 3.5 - 5.2 g/dL   AST 20  0 - 37 U/L   ALT 14  0 - 35 U/L   Alkaline Phosphatase 51  39 - 117 U/L   Total Bilirubin 0.5  0.3 - 1.2 mg/dL   GFR calc non Af Amer 24 (*) >90 mL/min   GFR calc Af Amer 28 (*) >90 mL/min  URINALYSIS, ROUTINE W REFLEX MICROSCOPIC      Result Value Range   Color, Urine YELLOW  YELLOW   APPearance HAZY (*) CLEAR   Specific Gravity, Urine 1.020  1.005 - 1.030   pH 5.0  5.0 - 8.0   Glucose, UA NEGATIVE  NEGATIVE mg/dL   Hgb urine dipstick TRACE (*) NEGATIVE   Bilirubin Urine SMALL (*) NEGATIVE   Ketones, ur NEGATIVE  NEGATIVE mg/dL   Protein, ur 30 (*) NEGATIVE mg/dL   Urobilinogen, UA 1.0  0.0 - 1.0 mg/dL   Nitrite NEGATIVE  NEGATIVE   Leukocytes, UA NEGATIVE  NEGATIVE  TROPONIN I      Result Value Range   Troponin I <0.30  <0.30 ng/mL  URINE MICROSCOPIC-ADD ON      Result  Value Range   Squamous Epithelial / LPF RARE  RARE   WBC, UA 0-2  <3 WBC/hpf   RBC / HPF 0-2  <3 RBC/hpf   Bacteria, UA RARE  RARE  CG4 I-STAT (LACTIC ACID)      Result Value Range   Lactic Acid, Venous 1.13  0.5 - 2.2 mmol/L   Dg Chest 2 View  04/17/2013   *RADIOLOGY REPORT*  Clinical Data: Chest pain and shortness of breath.  CHEST - 2 VIEW  Comparison: 01/02/2012  Findings: There may be subtle areas of acute infiltrate superimposed on severe chronic lung disease.  No pulmonary edema or pleural fluid is identified.  There is stable mild cardiomegaly.  IMPRESSION: Severe chronic lung disease with potential subtle areas of acute infiltrates bilaterally.   Original Report Authenticated By: Irish Lack, M.D.      MDM  Iv ns. Labs. Cxr. Ua.  Reviewed nursing notes and prior charts for additional history.    Date: 04/17/2013  Rate: 85  Rhythm: normal sinus rhythm  QRS Axis: normal  Intervals: normal  ST/T Wave abnormalities:  nonspecific ST/T changes  Conduction Disutrbances:lvh  Narrative Interpretation:   Old EKG Reviewed: unchanged   cxr noted ?bilateral infiltrates, pt notes fever, cough, rhonchi on exam.  Will rx for presumptive cap.  Rocephin and zitrhomax.  [facility] called to admit.       Suzi Roots, MD 04/17/13 2330

## 2013-04-17 NOTE — ED Notes (Signed)
[id number]<WG> Expected date:<BR> Expected time:<BR> Means of arrival:<BR> Comments:<BR> EMS/79 yo female with diarrhea and weakness

## 2013-04-18 ENCOUNTER — Encounter (HOSPITAL_COMMUNITY): Payer: Self-pay | Admitting: *Deleted

## 2013-04-18 DIAGNOSIS — J189 Pneumonia, unspecified organism: Secondary | ICD-10-CM | POA: Diagnosis present

## 2013-04-18 DIAGNOSIS — R5383 Other fatigue: Secondary | ICD-10-CM

## 2013-04-18 DIAGNOSIS — R5381 Other malaise: Secondary | ICD-10-CM

## 2013-04-18 DIAGNOSIS — E119 Type 2 diabetes mellitus without complications: Secondary | ICD-10-CM

## 2013-04-18 DIAGNOSIS — N184 Chronic kidney disease, stage 4 (severe): Secondary | ICD-10-CM

## 2013-04-18 LAB — GLUCOSE, CAPILLARY: Glucose-Capillary: 103 mg/dL — ABNORMAL HIGH (ref 70–99)

## 2013-04-18 MED ORDER — TIOTROPIUM BROMIDE MONOHYDRATE 18 MCG IN CAPS
18.0000 ug | ORAL_CAPSULE | Freq: Every day | RESPIRATORY_TRACT | Status: DC
  Administered 2013-04-18 – 2013-04-20 (×3): 18 ug via RESPIRATORY_TRACT
  Filled 2013-04-18: qty 5

## 2013-04-18 MED ORDER — HEPARIN SODIUM (PORCINE) 5000 UNIT/ML IJ SOLN
5000.0000 [IU] | Freq: Three times a day (TID) | INTRAMUSCULAR | Status: DC
  Administered 2013-04-18 – 2013-04-20 (×8): 5000 [IU] via SUBCUTANEOUS
  Filled 2013-04-18 (×11): qty 1

## 2013-04-18 MED ORDER — HYDROCODONE-ACETAMINOPHEN 5-325 MG PO TABS
1.0000 | ORAL_TABLET | Freq: Four times a day (QID) | ORAL | Status: DC | PRN
  Administered 2013-04-20: 1 via ORAL
  Filled 2013-04-18: qty 1

## 2013-04-18 MED ORDER — AZITHROMYCIN 500 MG PO TABS
500.0000 mg | ORAL_TABLET | Freq: Every day | ORAL | Status: DC
  Administered 2013-04-18: 500 mg via ORAL
  Filled 2013-04-18 (×2): qty 1

## 2013-04-18 MED ORDER — POTASSIUM CHLORIDE CRYS ER 20 MEQ PO TBCR
60.0000 meq | EXTENDED_RELEASE_TABLET | Freq: Every day | ORAL | Status: DC
  Administered 2013-04-18 – 2013-04-20 (×3): 60 meq via ORAL
  Filled 2013-04-18 (×3): qty 3

## 2013-04-18 MED ORDER — INSULIN ASPART 100 UNIT/ML ~~LOC~~ SOLN
0.0000 [IU] | Freq: Three times a day (TID) | SUBCUTANEOUS | Status: DC
  Administered 2013-04-18 – 2013-04-19 (×2): 2 [IU] via SUBCUTANEOUS
  Administered 2013-04-19 – 2013-04-20 (×2): 5 [IU] via SUBCUTANEOUS
  Administered 2013-04-20: 2 [IU] via SUBCUTANEOUS

## 2013-04-18 MED ORDER — ASPIRIN EC 81 MG PO TBEC
81.0000 mg | DELAYED_RELEASE_TABLET | Freq: Every day | ORAL | Status: DC
  Administered 2013-04-18 – 2013-04-20 (×3): 81 mg via ORAL
  Filled 2013-04-18 (×3): qty 1

## 2013-04-18 MED ORDER — SIMVASTATIN 20 MG PO TABS
20.0000 mg | ORAL_TABLET | Freq: Every day | ORAL | Status: DC
  Administered 2013-04-18 – 2013-04-20 (×3): 20 mg via ORAL
  Filled 2013-04-18 (×3): qty 1

## 2013-04-18 MED ORDER — AZITHROMYCIN 500 MG PO TABS
500.0000 mg | ORAL_TABLET | ORAL | Status: DC
  Filled 2013-04-18: qty 1

## 2013-04-18 MED ORDER — FUROSEMIDE 40 MG PO TABS
60.0000 mg | ORAL_TABLET | Freq: Two times a day (BID) | ORAL | Status: DC
  Administered 2013-04-18 – 2013-04-20 (×6): 60 mg via ORAL
  Filled 2013-04-18 (×7): qty 1

## 2013-04-18 MED ORDER — CEFTRIAXONE SODIUM 1 G IJ SOLR: 1.0000 g | INTRAMUSCULAR | Status: DC

## 2013-04-18 MED ORDER — PREDNISONE 10 MG PO TABS
10.0000 mg | ORAL_TABLET | Freq: Every day | ORAL | Status: DC
  Administered 2013-04-18 – 2013-04-20 (×3): 10 mg via ORAL
  Filled 2013-04-18 (×4): qty 1

## 2013-04-18 MED ORDER — GLIMEPIRIDE 2 MG PO TABS
2.0000 mg | ORAL_TABLET | Freq: Every day | ORAL | Status: DC
  Filled 2013-04-18: qty 1

## 2013-04-18 MED ORDER — ADULT MULTIVITAMIN W/MINERALS CH
1.0000 | ORAL_TABLET | Freq: Every day | ORAL | Status: DC
  Administered 2013-04-18 – 2013-04-20 (×3): 1 via ORAL
  Filled 2013-04-18 (×3): qty 1

## 2013-04-18 MED ORDER — NEBIVOLOL HCL 10 MG PO TABS
10.0000 mg | ORAL_TABLET | Freq: Every day | ORAL | Status: DC
  Administered 2013-04-18 – 2013-04-20 (×3): 10 mg via ORAL
  Filled 2013-04-18 (×3): qty 1

## 2013-04-18 MED ORDER — PANTOPRAZOLE SODIUM 40 MG PO TBEC
40.0000 mg | DELAYED_RELEASE_TABLET | Freq: Every day | ORAL | Status: DC
  Administered 2013-04-18 – 2013-04-20 (×3): 40 mg via ORAL
  Filled 2013-04-18 (×3): qty 1

## 2013-04-18 MED ORDER — LORATADINE 10 MG PO TABS
10.0000 mg | ORAL_TABLET | Freq: Every day | ORAL | Status: DC
  Administered 2013-04-18 – 2013-04-20 (×3): 10 mg via ORAL
  Filled 2013-04-18 (×3): qty 1

## 2013-04-18 NOTE — Progress Notes (Signed)
[facility] PROGRESS NOTE  Assessment/Plan: CAP (community acquired pneumonia): - rocephin + azithromycin 7.28.2014. - afebrile, ? Cough when eating perform a swallowing eval. - cultures pending.  CKD (chronic kidney disease), stage IV - baseline cr. 1.8-2.0  DIABETES MELLITUS, TYPE II: - good control. - cont current treatment.  Code Status: Full Code  Family Communication: No family in room  Disposition Plan: Admit to inpatient    Consultants:  none  Procedures:  none  Antibiotics:  Rocephin azithro  HPI/Subjective: Relates SOB is unchanged.  Objective: Filed Vitals:   04/17/13 2152 04/18/13 0245 04/18/13 0500 04/18/13 0827  BP:  158/71 156/72   Pulse:  76 70   Temp: 99.7 F (37.6 C) 97.8 F (36.6 C) 98.5 F (36.9 C)   TempSrc: Rectal Oral Oral   Resp:  18 16   Weight:  90.175 kg (198 lb 12.8 oz)    SpO2:  100% 100% 92%    Intake/Output Summary (Last 24 hours) at 04/18/13 0913 Last data filed at 04/17/13 2152  Gross per 24 hour  Intake      0 ml  Output    100 ml  Net   -100 ml   Filed Weights   04/18/13 0245  Weight: 90.175 kg (198 lb 12.8 oz)    Exam:  General: Alert, awake, oriented x3, in no acute distress.  HEENT: No bruits, no goiter.  Heart: Regular rate and rhythm, without murmurs, rubs, gallops.  Lungs: Good air movement, no wheezing. Abdomen: Soft, nontender, nondistended, positive bowel sounds.  Neuro: Grossly intact, nonfocal.   Data Reviewed: Basic Metabolic Panel:  Recent Labs Lab 04/17/13 2219  NA 138  K 4.3  CL 100  CO2 31  GLUCOSE 157*  BUN 35*  CREATININE 1.92*  CALCIUM 8.7   Liver Function Tests:  Recent Labs Lab 04/17/13 2219  AST 20  ALT 14  ALKPHOS 51  BILITOT 0.5  PROT 6.5  ALBUMIN 3.0*   No results found for this basename: LIPASE, AMYLASE,  in the last 168 hours No results found for this basename: AMMONIA,  in the last 168 hours CBC:  Recent Labs Lab 04/17/13 2219  WBC 9.4  HGB  10.8*  HCT 34.3*  MCV 91.2  PLT 154   Cardiac Enzymes:  Recent Labs Lab 04/17/13 2219  TROPONINI <0.30   BNP (last 3 results)  Recent Labs  04/17/13 2219  PROBNP 1313.0*   CBG:  Recent Labs Lab 04/18/13 0746  GLUCAP 88    No results found for this or any previous visit (from the past 240 hour(s)).   Studies: Dg Chest 2 View  04/17/2013   *RADIOLOGY REPORT*  Clinical Data: Chest pain and shortness of breath.  CHEST - 2 VIEW  Comparison: 01/02/2012  Findings: There may be subtle areas of acute infiltrate superimposed on severe chronic lung disease.  No pulmonary edema or pleural fluid is identified.  There is stable mild cardiomegaly.  IMPRESSION: Severe chronic lung disease with potential subtle areas of acute infiltrates bilaterally.   Original Report Authenticated By: Irish Lack, M.D.    Scheduled Meds: . aspirin EC  81 mg Oral Daily  . azithromycin  500 mg Oral QHS  . cefTRIAXone (ROCEPHIN)  IV  1 g Intravenous Q24H  . furosemide  60 mg Oral BID  . heparin  5,000 Units Subcutaneous Q8H  . insulin aspart  0-15 Units Subcutaneous TID WC  . loratadine  10 mg Oral Daily  . multivitamin with minerals  1 tablet Oral Daily  . nebivolol  10 mg Oral Daily  . pantoprazole  40 mg Oral Daily  . potassium chloride SA  60 mEq Oral Daily  . predniSONE  10 mg Oral Q breakfast  . simvastatin  20 mg Oral q1800  . tiotropium  18 mcg Inhalation Daily   Continuous Infusions: . sodium chloride 20 mL/hr at 04/17/13 2207     Marinda Elk  [facility] Pager [phone]. If 8PM-8AM, please contact night-coverage at [email or web address], password [facility] 04/18/2013, 9:13 AM  LOS: 1 day

## 2013-04-18 NOTE — ED Notes (Signed)
Pattricia Boss (daughter) [phone] (home) [phone] (cell)

## 2013-04-18 NOTE — H&P (Signed)
[facility] History and Physical  KIHANNA KAMIYA ZOX:[id number] DOB: 14-Mar-1934 DOA: 04/17/2013  Referring physician: ED PCP: Oliver Barre, MD   Chief Complaint: Generalized weakness  HPI: [name] is a 79 y.o. female who presents to the ED with c/o generalized weakness, per family they feel that she is SOB worse than usual for her COPD which is O2 dependent 2-3L home O2 at baseline (patient initially is denying this but then is admitting to mild increase in SOB).  Patient states she has also been running low grade fevers at home.  In the ED work up showed low grade fever of 99.9 and CXR was suspicious for acute infiltrates on top of chronic changes.  Patient given CAP treatment and admitted to hospitalist.  Review of Systems: Legs are not more swollen than usual recently, has had mild cough but not really very productive, no dysuria, no focal deficits, no CP, 12 systems reviewed and otherwise negative.  Past Medical History  Diagnosis Date  . GERD 06/03/2007  . HYPERTENSION 06/03/2007  . OSTEOARTHRITIS 06/03/2007  . PEPTIC ULCER DISEASE 06/03/2007  . TRANSIENT ISCHEMIC ATTACK, HX OF 06/03/2007  . CONGESTIVE HEART FAILURE 06/03/2007    -2d echo 03/19/07 mild diastolic dysfunction, nl LA, RV  . DIABETES MELLITUS, TYPE II 06/03/2007  . LOW BACK PAIN 06/03/2007  . COPD 06/03/2007    Dr Ocie Doyne  . DEPRESSION 06/03/2007  . 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 Atrial fibrillation 08/10/2007  . HEMORRHOIDS 08/08/2009  . CONSTIPATION, INTERMITTENT 02/15/2010  . DYSPNEA/SHORTNESS OF BREATH 06/09/2008  . INTERSTITIAL LUNG DISEASE 08/10/2007    -onset 12/03 with definite steroid responsive component -PFT's October 11, 2009: VC 67%, no airflow obstruction DLC0 14%  . Allergic rhinitis   . Diverticulosis 05/11/2011  . Goiter 05/11/2011  . Shingles 05/11/2011   Past Surgical History  Procedure Laterality Date  . Stomach tumors removed  08/22/1991  . Abdominal hysterectomy  10/31/1983     TAH   Social History:  reports that she has never smoked. She has never used smokeless tobacco. She reports that she does not drink alcohol or use illicit drugs.   Allergies  Allergen Reactions  . Levofloxacin Nausea Only    REACTION: nausea  . Ultram (Tramadol Hcl) Nausea Only    Family History  Problem Relation Age of Onset  . Hypertension Mother   . Hypertension Father   . Cancer Sister     Breast Cancer, Liver Cancer    Prior to Admission medications   Medication Sig Start Date End Date Taking? Authorizing Provider  aspirin (ECOTRIN LOW STRENGTH) 81 MG EC tablet Take 81 mg by mouth daily.     Yes Historical Provider, MD  clotrimazole-betamethasone (LOTRISONE) cream Apply 1 application topically 2 (two) times daily.  01/09/11  Yes Corwin Levins, MD  fexofenadine (ALLEGRA) 180 MG tablet Take 180 mg by mouth daily.     Yes Historical Provider, MD  furosemide (LASIX) 20 MG tablet Take 60 mg by mouth 2 (two) times daily.   Yes Historical Provider, MD  glimepiride (AMARYL) 2 MG tablet Take 2 mg by mouth daily before breakfast.   Yes Historical Provider, MD  HYDROcodone-acetaminophen (NORCO/VICODIN) 5-325 MG per tablet Take 1 tablet by mouth every 6 (six) hours as needed for pain. With limit 4 tabs per day 11/11/12  Yes Corwin Levins, MD  lovastatin (MEVACOR) 40 MG tablet Take 40 mg by mouth at bedtime.   Yes Historical Provider, MD  Multiple Vitamin (MULTIVITAMIN) tablet Take 1 tablet by mouth daily.     Yes Historical Provider, MD  nebivolol (BYSTOLIC) 10 MG tablet Take 10 mg by mouth daily.   Yes Historical Provider, MD  omeprazole (PRILOSEC) 20 MG capsule Take 20 mg by mouth daily.   Yes Historical Provider, MD  potassium chloride SA (K-DUR,KLOR-CON) 20 MEQ tablet Take 60 mEq by mouth daily.   Yes Historical Provider, MD  predniSONE (DELTASONE) 10 MG tablet take 1 tablet by mouth once daily 01/10/13  Yes Corwin Levins, MD  tiotropium (SPIRIVA) 18 MCG inhalation capsule Place 18 mcg  into inhaler and inhale daily.   Yes Historical Provider, MD   Physical Exam: Filed Vitals:   04/17/13 2050 04/17/13 2152  BP: 136/85   Pulse: 85   Temp: 99.2 F (37.3 C) 99.7 F (37.6 C)  TempSrc: Oral Rectal  Resp: 22   SpO2: 100%     General:  NAD, resting comfortably in bed Eyes: PEERLA EOMI ENT: mucous membranes moist Neck: supple w/o JVD Cardiovascular: RRR w/o MRG Respiratory: CTA B Abdomen: soft, nt, nd, bs+ Skin: no rash nor lesion Musculoskeletal: MAE, full ROM all 4 extremities Psychiatric: normal tone and affect Neurologic: AAOx3, grossly non-focal  Labs on Admission:  Basic Metabolic Panel:  Recent Labs Lab 04/17/13 2219  NA 138  K 4.3  CL 100  CO2 31  GLUCOSE 157*  BUN 35*  CREATININE 1.92*  CALCIUM 8.7   Liver Function Tests:  Recent Labs Lab 04/17/13 2219  AST 20  ALT 14  ALKPHOS 51  BILITOT 0.5  PROT 6.5  ALBUMIN 3.0*   No results found for this basename: LIPASE, AMYLASE,  in the last 168 hours No results found for this basename: AMMONIA,  in the last 168 hours CBC:  Recent Labs Lab 04/17/13 2219  WBC 9.4  HGB 10.8*  HCT 34.3*  MCV 91.2  PLT 154   Cardiac Enzymes:  Recent Labs Lab 04/17/13 2219  TROPONINI <0.30    BNP (last 3 results)  Recent Labs  04/17/13 2219  PROBNP 1313.0*   CBG: No results found for this basename: GLUCAP,  in the last 168 hours  Radiological Exams on Admission: Dg Chest 2 View  04/17/2013   *RADIOLOGY REPORT*  Clinical Data: Chest pain and shortness of breath.  CHEST - 2 VIEW  Comparison: 01/02/2012  Findings: There may be subtle areas of acute infiltrate superimposed on severe chronic lung disease.  No pulmonary edema or pleural fluid is identified.  There is stable mild cardiomegaly.  IMPRESSION: Severe chronic lung disease with potential subtle areas of acute infiltrates bilaterally.   Original Report Authenticated By: Irish Lack, M.D.    EKG: Independently  reviewed.  Assessment/Plan Principal Problem:   CAP (community acquired pneumonia) Active Problems:   DIABETES MELLITUS, TYPE II   CKD (chronic kidney disease), stage IV   1. CAP - on PNA pathway, ABX ordered, cultures ordered (but blood cultures being drawn after first dose of ABx already given in ED).  O2 as needed, satting 100% on 2L at this time. 2. DM 2- holding home meds and ordering SSI AC/HS 3. CKD - at baseline    Code Status: Full Code (must indicate code status--if unknown or must be presumed, indicate so) Family Communication: No family in room (indicate person spoken with, if applicable, with phone number if by telephone) Disposition Plan: Admit to inpatient (indicate anticipated LOS)  Time spent: 50 min  GARDNER, JARED M. [facility] Pager [phone]  If 7PM-7AM, please contact night-coverage [email or web address] Password [facility] 04/18/2013, 1:35 AM

## 2013-04-18 NOTE — Evaluation (Signed)
Clinical/Bedside Swallow Evaluation Patient Details  Name: [name] MRN: [id number] Date of Birth: Feb 21, 1934  Today's Date: 04/18/2013 Time: 4098-1191 SLP Time Calculation (min): 31 min  Past Medical History:  Past Medical History  Diagnosis Date  . GERD 06/03/2007  . HYPERTENSION 06/03/2007  . OSTEOARTHRITIS 06/03/2007  . PEPTIC ULCER DISEASE 06/03/2007  . TRANSIENT ISCHEMIC ATTACK, HX OF 06/03/2007  . CONGESTIVE HEART FAILURE 06/03/2007    -2d echo 03/19/07 mild diastolic dysfunction, nl LA, RV  . DIABETES MELLITUS, TYPE II 06/03/2007  . LOW BACK PAIN 06/03/2007  . COPD 06/03/2007    Dr Ocie Doyne  . DEPRESSION 06/03/2007  . 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 Atrial fibrillation 08/10/2007  . HEMORRHOIDS 08/08/2009  . CONSTIPATION, INTERMITTENT 02/15/2010  . DYSPNEA/SHORTNESS OF BREATH 06/09/2008  . INTERSTITIAL LUNG DISEASE 08/10/2007    -onset 12/03 with definite steroid responsive component -PFT's October 11, 2009: VC 67%, no airflow obstruction DLC0 14%  . Allergic rhinitis   . Diverticulosis 05/11/2011  . Goiter 05/11/2011  . Shingles 05/11/2011   Past Surgical History:  Past Surgical History  Procedure Laterality Date  . Stomach tumors removed  08/22/1991  . Abdominal hysterectomy  10/31/1983    TAH   HPI:  79 yo female admitted to [facility] and was diagnosed with CAP.  Pt CXR 7/27 showed severe chronic lung disease, possible sublte acute infiltrate bilateral.  Pt has PMH of PUD, GERD, TIA, ILD, CHF, COPD, DM2, benign stomach tumor 99, goiter, shingles, SOB, constipation, Afib.  Pt reports being on oxygen 24/7 due to her ILD.  Her medication list includes Prilosec 20 mg - same dosage and medication for years per her daugher Pattricia Boss.  She has not been taking Allegra x 1-2 months per daughter due to expense of medication.  Per pt, she deneis dysphagia nor significant issues with GERD.  Pt noted to be leaning to the right, which her family states has occured for several months  with sudden onset.  MD documented pt cough with intake and SLP swallow evaluation was ordered.  Pt's RN has not observed pt coughing with po per SLP conversation with her today.     Assessment / Plan / Recommendation Clinical Impression  Pt presents with functional oropharyngeal swallow ability based on clinical swallow evaluation.  She did not demonstrate any s/s of aspiration with four ounces water, 4 crackers  or approx 4 ounces applesauce.  Swallow was timely with clear voice throughout and no increase work of breathing noted.  Family reports pt eats at rapid rate - and this was observed today.   SLP encouraged pt to slow rate of eating and take small bites/sips to mitigate her risk.  Pt does not appear to be overtly aspirating but she is at episodic aspiraiton risk due to COPD impacting swallow/respiration coordination.    SLP educated pt, daughter Pattricia Boss and son Cherly Beach to general aspiraiton and reflux precautions to mitigate aspiration risk.  Used teach back to assure understanding.  Will follow up x1 for tolerance of po and to assure all education is completed.  Thanks for this referral.     Aspiration Risk       Diet Recommendation Regular;Thin liquid   Liquid Administration via: Straw;Cup Medication Administration:  (as tolerated) Supervision: Patient able to self feed Compensations: Slow rate;Small sips/bites (EAT SLOWLY!) Postural Changes and/or Swallow Maneuvers: Seated upright 90 degrees;Upright 30-60 min after meal    Other  Recommendations Oral Care Recommendations: Oral care BID  Follow Up Recommendations  None    Frequency and Duration min 1 x/week  1 week   Pertinent Vitals/Pain Afebrile, decreased, intake 90-95%!     SLP Swallow Goals Patient will utilize recommended strategies during swallow to increase swallowing safety with: Supervision/safety   Swallow Study Prior Functional Status   no previous dysphagia per pt    General Date of Onset: 04/18/13 HPI: 79  yo female admitted to [facility] and was diagnosed with CAP.  Pt CXR 7/27 showed severe chronic lung disease, possible sublte acute infiltrate bilateral.  Pt has PMH of PUD, GERD, TIA, ILD, CHF, COPD, DM2, benign stomach tumor 99, goiter, shingles, SOB, constipation, Afib.  Pt reports being on oxygen 24/7 due to her ILD.  Her medication list includes Prilosec 20 mg - same dosage and medication for years per her daugher Pattricia Boss.  She has not been taking Allegra x 1-2 months per daughter due to expense of medication.  Per pt, she deneis dysphagia nor significant issues with GERD.  Pt noted to be leaning to the right, which her family states has occured for several months with sudden onset.  MD documented pt cough with intake and SLP swallow evaluation was ordered.  Pt's RN has not observed pt coughing with po per SLP conversation with her today.   Type of Study: Bedside swallow evaluation Diet Prior to this Study: Regular;Thin liquids Temperature Spikes Noted: No Respiratory Status: Supplemental O2 delivered via (comment) History of Recent Intubation: No Behavior/Cognition: Alert;Cooperative;Pleasant mood Oral Cavity - Dentition: Dentures, top (lower dentures missing) Self-Feeding Abilities: Able to feed self (pt is right handed, using left hand more today) Patient Positioning: Upright in bed Baseline Vocal Quality: Clear Volitional Cough: Strong Volitional Swallow: Able to elicit    Oral/Motor/Sensory Function Overall Oral Motor/Sensory Function: Appears within functional limits for tasks assessed   Ice Chips Ice chips: Not tested   Thin Liquid Thin Liquid: Within functional limits Presentation: Self Fed;Cup;Straw    Nectar Thick Nectar Thick Liquid: Not tested   Honey Thick Honey Thick Liquid: Not tested   Puree Puree: Within functional limits Presentation: Self Fed;Spoon   Solid   GO    Solid: Within functional limits Presentation: Self Fed Other Comments: graham cracker, pt eats at rapid rate        Donavan Burnet, MS [facility] SLP [phone]

## 2013-04-18 NOTE — Progress Notes (Signed)
Clinical Social Work Department BRIEF PSYCHOSOCIAL ASSESSMENT 04/18/2013  Patient:  [name], [name]     Account Number:  [id number]     Admit date:  04/17/2013  Clinical Social Worker:  Jacelyn Grip  Date/Time:  04/18/2013 02:30 PM  Referred by:  RN  Date Referred:  04/18/2013 Referred for  SNF Placement   Other Referral:   Interview type:  Patient Other interview type:   patient family at bedside    PSYCHOSOCIAL DATA Living Status:  FAMILY Admitted from facility:   Level of care:   Primary support name:  Pattricia Boss Bonnet/daughter/ [phone] Primary support relationship to patient:  CHILD, ADULT Degree of support available:   strong    CURRENT CONCERNS Current Concerns  Post-Acute Placement   Other Concerns:    SOCIAL WORK ASSESSMENT / PLAN CSW received phone call from RN stating that pt family at bedside and requesting to speak with CSW as pt has lived at home with pt daughter and pt son and pt family feels that pt needs SNF.    CSW met with pt and pt family at bedside to discuss. Pt daughter discussed that pt care at home has become too much for pt family to be able to manage. Pt daughter stated that pt does not really want to go to a SNF due to a bad experience in that past, but pt is agreeable to SNF as long as [employer] is excluded in the search. Pt daughter discussed that pt may need to be placed long term. CSW discussed that pt family would need to apply for Medicaid for pt if it is anticipated that pt may need long term placement. Pt daughter stated that pt family may eventually be able to take pt home, but it will just depend on pt progress. CSW provided packet of information for families about medicaid and coverage for SNF facilities. CSW also provided pt daughter Medicaid application.    Pt and pt family agreeable to initiation of SNF search to [facility] except for [facility].    CSW completed FL2 and initiated SNF search.    CSW  provided SNF list to pt daughter and encouraged pt family to go ahead and tour SNF facilities.    CSW to follow up with pt and pt family re: bed offers.    CSW to continue to follow and facilitate pt discharge needs when pt medically ready for discharge.   Assessment/plan status:  Psychosocial Support/Ongoing Assessment of Needs Other assessment/ plan:   discharge planning   Information/referral to community resources:   [employer]  Information for families on applying for [facility]  Medicaid application    PATIENT'S/FAMILY'S RESPONSE TO PLAN OF CARE: Pt alert and oriented x 4. Pt had a bad experience a number of years ago in a SNF, but is willing to go to SNF as long as it is not the same facility. Pt family is very supportive and actively involved in pt care.     Jacklynn Lewis, MSW, LCSWA  Clinical Social Work [phone]

## 2013-04-18 NOTE — Progress Notes (Signed)
Nutrition Brief Note  Patient identified on the Malnutrition Screening Tool (MST) Report  Body mass index is 34.11 kg/(m^2). Patient meets criteria for class I obesity based on current BMI.   Current diet order is heart healthy, patient is consuming approximately 95% of meals at this time. Labs and medications reviewed. BUN/Cr elevated with low GFR. Met with pt who reports eating well PTA, 3 meals/day with good appetite. Pt wears dentures and reports they fit well. Denies any problems chewing or swallowing. Denies being on any nutritional supplements at home. Denies any significant weight loss recently. Eating well.   No nutrition interventions warranted at this time. If nutrition issues arise, please consult RD.   Levon Hedger MS, RD, LDN [phone] Pager [phone] After Hours Pager

## 2013-04-18 NOTE — Evaluation (Signed)
Physical Therapy Evaluation Patient Details Name: [name] MRN: [id number] DOB: [birth year] Today's Date: 04/18/2013 Time: [phone] PT Time Calculation (min): 34 min  PT Assessment / Plan / Recommendation History of Present Illness  [name] is a 79 y.o. female who presents to the ED with c/o generalized weakness, per family they feel that she is SOB worse than usual for her COPD which is O2 dependent 2-3L home O2 at baseline (patient initially is denying this but then is admitting to mild increase in SOB).  Patient states she has also been running low grade fevers at home.  Clinical Impression  Pt is deconditioned with some limitation in knee ROM, but she cooperated well with PT and has potential to improve with continued PT at SNF to decrease burden of care    PT Assessment  Patient needs continued PT services    Follow Up Recommendations  SNF    Does the patient have the potential to tolerate intense rehabilitation      Barriers to Discharge        Equipment Recommendations  Rolling walker with 5" wheels    Recommendations for Other Services OT consult   Frequency Min 3X/week    Precautions / Restrictions Precautions Precautions: Fall   Pertinent Vitals/Pain C/o pain with knee flexion ROM      Mobility  Bed Mobility Bed Mobility: Rolling Right;Rolling Left;Supine to Sit;Sit to Supine Rolling Right: 1: +2 Total assist Rolling Right: Patient Percentage: 30% Rolling Left: 1: +2 Total assist Supine to Sit: 3: Mod assist;HOB elevated Sit to Supine: 2: Max assist;HOB flat Details for Bed Mobility Assistance: pt needs multimodal cues and assist for all bed mobility.  She has difficulty with rolling due to decreased strength and ROM in legs to assist Transfers Transfers: Not assessed Ambulation/Gait Ambulation/Gait Assistance: 1: +2 Total assist    Exercises General Exercises - Lower Extremity Ankle Circles/Pumps: AROM;Both;5 reps;Supine Quad Sets: AROM;Both;5  reps;Supine Gluteal Sets: AROM;Both;5 reps;Supine;Seated Long [employer]: AROM;5 reps;Supine Hip Flexion/Marching: AROM;Both;AAROM;5 reps;Seated Other Exercises Other Exercises: bridges with legs stabalized in supine Other Exercises: acitve knee flexion in sitting to try to increase ROM   PT Diagnosis: Difficulty walking;Abnormality of gait;Generalized weakness;Acute pain  PT Problem List: Decreased strength;Decreased range of motion;Decreased activity tolerance;Decreased mobility;Pain PT Treatment Interventions: Gait training;Functional mobility training;Therapeutic activities;Therapeutic exercise;Balance training;Patient/family education     PT Goals(Current goals can be found in the care plan section) Acute Rehab PT Goals Patient Stated Goal: I want to walk again PT Goal Formulation: With patient Time For Goal Achievement: 05/02/13 Potential to Achieve Goals: Fair  Visit Information  Last PT Received On: 04/18/13 Assistance Needed: +2 History of Present Illness: [name] is a 79 y.o. female who presents to the ED with c/o generalized weakness, per family they feel that she is SOB worse than usual for her COPD which is O2 dependent 2-3L home O2 at baseline (patient initially is denying this but then is admitting to mild increase in SOB).  Patient states she has also been running low grade fevers at home.       Prior Functioning  Home Living Family/patient expects to be discharged to:: Skilled nursing facility Living Arrangements: Children Prior Function Level of Independence: Needs assistance Comments: Pt reports she has not been able to walk Communication Communication: No difficulties    Cognition  Cognition Arousal/Alertness: Awake/alert Behavior During Therapy: WFL for tasks assessed/performed Overall Cognitive Status: Within Functional Limits for tasks assessed    Extremity/Trunk Assessment  Lower Extremity Assessment Lower Extremity Assessment: RLE  deficits/detail;LLE deficits/detail RLE Deficits / Details: edema and hemosiderin staining on lower leg. Pt has decreased ROM of knee flexion and diffuse muscle atophy, but she is able to initiate muscle contraction of all groups. LLE Deficits / Details: edema and hemosiderin staining on lower leg. Pt has decreased ROM of knee flexion and diffuse muscle atophy, but she is able to initiate muscle contraction of all groups. Some clonus noted with quick stretch of left achilles tendon Cervical / Trunk Assessment Cervical / Trunk Assessment: Other exceptions;Kyphotic Cervical / Trunk Exceptions: forward head.  Muscle atophy  decreased mobility of scapular girdle   Balance Balance Balance Assessed: Yes Static Sitting Balance Static Sitting - Balance Support: No upper extremity supported;Feet supported Static Sitting - Level of Assistance: 5: Stand by assistance;6: Modified independent (Device/Increase time) Static Sitting - Comment/# of Minutes: pt maintains flexed posture, but did attempt to increased trunk extenstion. She was able to sit on EOB ~10 minutes and perform LE, UE and trunk activation ex  End of Session PT - End of Session Activity Tolerance: Patient limited by fatigue Patient left: in bed;with nursing/sitter in room Nurse Communication: Mobility status  GP    Bayard Hugger. [place], [place]  [phone] 04/18/2013, 3:48 PM

## 2013-04-18 NOTE — ED Notes (Signed)
Pt received antibiotics prior to blood culture orders.

## 2013-04-18 NOTE — Progress Notes (Signed)
Clinical Social Work Department CLINICAL SOCIAL WORK PLACEMENT NOTE 04/18/2013  Patient:  [name], [name]  Account Number:  [id number] Admit date:  04/17/2013  Clinical Social Worker:  Jacelyn Grip  Date/time:  04/18/2013 03:30 PM  Clinical Social Work is seeking post-discharge placement for this patient at the following level of care:   SKILLED NURSING   (*CSW will update this form in Epic as items are completed)   04/18/2013  Patient/family provided with [facility] Department of Clinical Social Work's list of facilities offering this level of care within the geographic area requested by the patient (or if unable, by the patient's family).  04/18/2013  Patient/family informed of their freedom to choose among providers that offer the needed level of care, that participate in Medicare, Medicaid or managed care program needed by the patient, have an available bed and are willing to accept the patient.  04/18/2013  Patient/family informed of [facility]' ownership interest in [facility], as well as of the fact that they are under no obligation to receive care at this facility.  PASARR submitted to EDS on 04/18/2013 PASARR number received from EDS on 04/18/2013  FL2 transmitted to all facilities in geographic area requested by pt/family on  04/18/2013 FL2 transmitted to all facilities within larger geographic area on   Patient informed that his/her managed care company has contracts with or will negotiate with  certain facilities, including the following:     Patient/family informed of bed offers received:   Patient chooses bed at  Physician recommends and patient chooses bed at    Patient to be transferred to  on   Patient to be transferred to facility by   The following physician request were entered in Epic:   Additional Comments:    Jacklynn Lewis, MSW, LCSWA  Clinical Social Work [phone]

## 2013-04-19 LAB — GLUCOSE, CAPILLARY
Glucose-Capillary: 85 mg/dL (ref 70–99)
Glucose-Capillary: 148 mg/dL — ABNORMAL HIGH (ref 70–99)
Glucose-Capillary: 225 mg/dL — ABNORMAL HIGH (ref 70–99)
Glucose-Capillary: 133 mg/dL — ABNORMAL HIGH (ref 70–99)

## 2013-04-19 LAB — LEGIONELLA ANTIGEN, URINE

## 2013-04-19 MED ORDER — AZITHROMYCIN 250 MG PO TABS
250.0000 mg | ORAL_TABLET | Freq: Every day | ORAL | Status: DC
  Administered 2013-04-19 – 2013-04-20 (×2): 250 mg via ORAL
  Filled 2013-04-19 (×2): qty 1

## 2013-04-19 MED ORDER — AZITHROMYCIN 500 MG PO TABS: 500.0000 mg | ORAL_TABLET | Freq: Every day | ORAL | Status: DC

## 2013-04-19 NOTE — Progress Notes (Signed)
Rehab Admissions Coordinator Note:  Patient was screened by Trish Mage for appropriateness for an Inpatient Acute Rehab Consult.  Noted PT recommending CIR.   At this time, we are recommending Inpatient Rehab consult.  Trish Mage 04/19/2013, 2:49 PM  I can be reached at [phone].

## 2013-04-19 NOTE — Progress Notes (Signed)
Physical Therapy Treatment Patient Details Name: [name] MRN: [id number] DOB: 10-23-1933 Today's Date: 04/19/2013 Time: 4403-4742 PT Time Calculation (min): 8 min  PT Assessment / Plan / Recommendation  History of Present Illness pt reports she feels better today   PT Comments   Pt did better with standing this afternoon  Follow Up Recommendations  CIR     Does the patient have the potential to tolerate intense rehabilitation     Barriers to Discharge        Equipment Recommendations  Rolling walker with 5" wheels;Other (comment) (if directly home: HHPT, ade, ?hospital bed, hoyer lift)    Recommendations for Other Services Rehab consult  Frequency Min 3X/week   Progress towards PT Goals Progress towards PT goals: Progressing toward goals  Plan Current plan remains appropriate    Precautions / Restrictions Precautions Precautions: Fall   Pertinent Vitals/Pain No c/o pain    Mobility  Bed Mobility Bed Mobility: Supine to Sit Rolling Right: Patient Percentage: 30% Supine to Sit: 3: Mod assist;HOB elevated Details for Bed Mobility Assistance: pt needs multimodal cues and assist for all bed mobility.  She has difficulty with rolling due to decreased strength and ROM in legs to assist Transfers Transfers: Sit to Stand;Stand to Sit Sit to Stand: 1: +2 Total assist Sit to Stand: Patient Percentage: 50% Stand to Sit: 1: +2 Total assist;To bed Stand to Sit: Patient Percentage: 50% Stand Pivot Transfers: 1: +2 Total assist Stand Pivot Transfers: Patient Percentage: 30% Details for Transfer Assistance: pt assist to stand and was able to maintain for 30 sec to allow for lift pad to be placed under pt.      Exercises General Exercises - Lower Extremity Ankle Circles/Pumps: AROM;Both;5 reps;Supine Quad Sets: AROM;Both;5 reps;Supine Gluteal Sets: AROM;Both;5 reps;Supine;Seated Long Arc Quad: AROM;Supine;20 reps Hip Flexion/Marching: AROM;Both;AAROM;5 reps;Seated Other  Exercises Other Exercises: trunk extension exercises in sitting   PT Diagnosis:    PT Problem List:   PT Treatment Interventions:     PT Goals (current goals can now be found in the care plan section)    Visit Information  Last PT Received On: 04/19/13 Assistance Needed: +2 History of Present Illness: pt reports she feels better today    Subjective Data      Cognition  Cognition Arousal/Alertness: Awake/alert Behavior During Therapy: WFL for tasks assessed/performed Overall Cognitive Status: Within Functional Limits for tasks assessed    Balance  Balance Balance Assessed: Yes Static Sitting Balance Static Sitting - Balance Support: No upper extremity supported;Feet supported Static Sitting - Level of Assistance: 5: Stand by assistance;6: Modified independent (Device/Increase time) Static Sitting - Comment/# of Minutes: fexed posture, but able to sit unsupported > 10 min for LE and core ex Static Standing Balance Static Standing - Balance Support: Bilateral upper extremity supported Static Standing - Level of Assistance: 1: +2 Total assist Static Standing - Comment/# of Minutes: standing with RW x 10 sec for 2 trials Pt ~ 50 % for standing  End of Session PT - End of Session Activity Tolerance: Patient tolerated treatment well Patient left: in bed;with nursing/sitter in room Nurse Communication: Mobility status   GP     Donnetta Hail 04/19/2013, 2:40 PM

## 2013-04-19 NOTE — Progress Notes (Signed)
[facility] PROGRESS NOTE  Assessment/Plan: CAP (community acquired pneumonia): - rocephin + azithromycin 7.27.2014. Change to orals azithromycin 7.29.2014 - afebrile, no aspiration on SLP. - cultures pending.  CKD (chronic kidney disease), stage IV - baseline cr. 1.8-2.0  DIABETES MELLITUS, TYPE II: - good control. - cont current treatment.  Code Status: Full Code  Family Communication: No family in room  Disposition Plan: Admit to inpatient    Consultants:  none  Procedures:  none  Antibiotics:  Rocephin for 48 hrs azithro  HPI/Subjective: Feels much better.  Objective: Filed Vitals:   04/18/13 1310 04/18/13 1500 04/18/13 2128 04/19/13 0534  BP: 129/71  157/77 147/70  Pulse: 81  70 70  Temp: 98.1 F (36.7 C)  97.6 F (36.4 C) 97.4 F (36.3 C)  TempSrc: Oral  Oral Oral  Resp: 16  16 16   Height:  5\' 8"  (1.727 m)    Weight:      SpO2: 100%  100% 100%    Intake/Output Summary (Last 24 hours) at 04/19/13 0844 Last data filed at 04/19/13 0835  Gross per 24 hour  Intake    800 ml  Output      0 ml  Net    800 ml   Filed Weights   04/18/13 0245  Weight: 90.175 kg (198 lb 12.8 oz)    Exam:  General: Alert, awake, oriented x3, in no acute distress.  HEENT: No bruits, no goiter.  Heart: Regular rate and rhythm, without murmurs, rubs, gallops.  Lungs: Good air movement, no wheezing. Abdomen: Soft, nontender, nondistended, positive bowel sounds.  Neuro: Grossly intact, nonfocal.   Data Reviewed: Basic Metabolic Panel:  Recent Labs Lab 04/17/13 2219  NA 138  K 4.3  CL 100  CO2 31  GLUCOSE 157*  BUN 35*  CREATININE 1.92*  CALCIUM 8.7   Liver Function Tests:  Recent Labs Lab 04/17/13 2219  AST 20  ALT 14  ALKPHOS 51  BILITOT 0.5  PROT 6.5  ALBUMIN 3.0*   No results found for this basename: LIPASE, AMYLASE,  in the last 168 hours No results found for this basename: AMMONIA,  in the last 168 hours CBC:  Recent Labs Lab  04/17/13 2219  WBC 9.4  HGB 10.8*  HCT 34.3*  MCV 91.2  PLT 154   Cardiac Enzymes:  Recent Labs Lab 04/17/13 2219  TROPONINI <0.30   BNP (last 3 results)  Recent Labs  04/17/13 2219  PROBNP 1313.0*   CBG:  Recent Labs Lab 04/18/13 0746 04/18/13 1129 04/18/13 1711 04/18/13 2202 04/19/13 0802  GLUCAP 88 103* 141* 102* 85    Recent Results (from the past 240 hour(s))  CULTURE, BLOOD (ROUTINE X 2)     Status: None   Collection Time    04/18/13  5:01 AM      Result Value Range Status   Specimen Description BLOOD LEFT FOREARM   Final   Special Requests BOTTLES DRAWN AEROBIC ONLY 8CC   Final   Culture  Setup Time 04/18/2013 10:41   Final   Culture     Final   Value:        BLOOD CULTURE RECEIVED NO GROWTH TO DATE CULTURE WILL BE HELD FOR 5 DAYS BEFORE ISSUING A FINAL NEGATIVE REPORT   Report Status PENDING   Incomplete  CULTURE, BLOOD (ROUTINE X 2)     Status: None   Collection Time    04/18/13  5:07 AM      Result Value Range Status  Specimen Description BLOOD LEFT HAND   Final   Special Requests BOTTLES DRAWN AEROBIC ONLY 2CC   Final   Culture  Setup Time 04/18/2013 10:41   Final   Culture     Final   Value:        BLOOD CULTURE RECEIVED NO GROWTH TO DATE CULTURE WILL BE HELD FOR 5 DAYS BEFORE ISSUING A FINAL NEGATIVE REPORT   Report Status PENDING   Incomplete     Studies: Dg Chest 2 View  04/17/2013   *RADIOLOGY REPORT*  Clinical Data: Chest pain and shortness of breath.  CHEST - 2 VIEW  Comparison: 01/02/2012  Findings: There may be subtle areas of acute infiltrate superimposed on severe chronic lung disease.  No pulmonary edema or pleural fluid is identified.  There is stable mild cardiomegaly.  IMPRESSION: Severe chronic lung disease with potential subtle areas of acute infiltrates bilaterally.   Original Report Authenticated By: Irish Lack, M.D.    Scheduled Meds: . aspirin EC  81 mg Oral Daily  . azithromycin  500 mg Oral Daily  . furosemide  60  mg Oral BID  . heparin  5,000 Units Subcutaneous Q8H  . insulin aspart  0-15 Units Subcutaneous TID WC  . loratadine  10 mg Oral Daily  . multivitamin with minerals  1 tablet Oral Daily  . nebivolol  10 mg Oral Daily  . pantoprazole  40 mg Oral Daily  . potassium chloride SA  60 mEq Oral Daily  . predniSONE  10 mg Oral Q breakfast  . simvastatin  20 mg Oral q1800  . tiotropium  18 mcg Inhalation Daily   Continuous Infusions: . sodium chloride 20 mL/hr at 04/17/13 2207     Marinda Elk  [facility] Pager [phone]. If 8PM-8AM, please contact night-coverage at [email or web address], password [facility] 04/19/2013, 8:44 AM  LOS: 2 days

## 2013-04-19 NOTE — Progress Notes (Signed)
Patient is currently active with [facility] for chronic disease management services.  Patient has been engaged by a [employer].  Our community based plan of care has focused on disease management of diabetes, CHF, and HTN.  She has had difficulty with weighing herself and does not consistently monitor her blood sugars.  Patient will receive a post discharge transition of care call and will be evaluated for monthly home visits for assessments and disease process education.  Made inpatient Case Manager aware that [facility] Care Management following. Of note, [facility] Care Management services does not replace or interfere with any services that are arranged by inpatient case management or social work.  For additional questions or referrals please contact Anibal Henderson BSN RN [facility] Liaison at [phone].

## 2013-04-19 NOTE — Care Management Note (Addendum)
    Page 1 of 2   04/20/2013     1:56:55 PM   CARE MANAGEMENT NOTE 04/20/2013  Patient:  [name], [name]   Account Number:  [id number]  Date Initiated:  04/19/2013  Documentation initiated by:  Lanier Clam  Subjective/Objective Assessment:   ADMITTED W/PNA.     Action/Plan:   FROM HOME W/DTR-ANNIE, & SON(BOTH WORK DURING DAY). HAS PCP,PHARMACY.HAS HOME 02-[facility].   Anticipated DC Date:  04/20/2013   Anticipated DC Plan:  HOME W HOME HEALTH SERVICES      DC Planning Services  CM consult      Choice offered to / List presented to:  C-4 Adult Children   DME arranged  HOSPITAL BED  [facility] - Lavone Nian      DME agency  [facility]     HH arranged  HH-1 RN  HH-2 PT  HH-3 OT  HH-4 NURSE'S AIDE  HH-6 SOCIAL WORKER      HH agency  [facility]   Status of service:  Completed, signed off Medicare Important Message given?  NA - LOS <3 / Initial given by admissions (If response is "NO", the following Medicare IM given date fields will be blank) Date Medicare IM given:   Date Additional Medicare IM given:  04/20/2013  Discharge Disposition:  HOME W HOME HEALTH SERVICES  Per UR Regulation:  Reviewed for med. necessity/level of care/duration of stay  If discussed at Long Length of Stay Meetings, dates discussed:    Comments:  04/20/13 Bucky Grigg RN,BSN NCM [phone] SPOKE TO MD ABOUT D/C PLANS.FOR D/C HOME W/HH/DME.SPOKE TO DTR ANNIE ABOUT D/C PLANS OVER PHONE TEL#[phone],SHE SHARED HER CONCERNS THAT SHE CANNOT TAKE CARE OF HER MOTHER @ HOME, & GOING TO A SKILLED FACILITY WOULD BE THE BEST, BUT I EXPLAINED THAT SHE DID NOT HAVE A 3 DAY QUALIFYING STAY. SHE DID VOICE HER UNDERSTANDING.ALSO INFORMED HER OF MEDICARE IM NOTICE @ D/C.SHE VOICED UNDERSTANDING.[facility] CHOSEN FOR HH/DME.LEFT MESSAGE W/[facility] ABOUT D/C TODAY & HH.TC [facility] DME REP ABOUT HOSPITAL BED,HOYER LIFT,RW TO BE DELIVEREDTO HOME.BROTHER TO BE CONTACTED FOR DELIVERY OF DME CARLYLE#[phone]  1058.AMBULANCE TRANSP-CONTACT DTR.FAMILY WANTS DME DELIVERED TO HOME PRIOR PATIENT COMING HOME.NURSE DIANA AWARE OF D/C PLANS & COORDINATING W/DTR ON TIMING FORAMBULANCE TRANSP.  04/19/13 Neel Buffone RN,BSN NCM [phone] PT-INITIALLY RECOMMENDED SNF/CIR.NOTED INPT REHAB COORDIN SCREENED,& PATIENT NEEDS INPT REHAB CONS.MD NOTIFIED.CM/SW SPOKE TO DTR ANNIE ABOUT D/C PLANS, & OPTIONS.INFORMED OF MEDICARE RULES/GUIDELINES OF 3 DAY INPATIENT QUALIFYING STAY FOR SNF, & HHC,PACE PROGRAM, LEFT INFO FOR DTR.DTR ANNIE VOICED UNDERSTANDING.[facility] CHOSEN IF HHC NEEDED FOR D/C, SPOKE TO JAMIE [facility] REP. IF HOME WILL NEED AMBULANCE TRANSP.

## 2013-04-19 NOTE — Progress Notes (Signed)
CSW and RNCM met with pt and pt daughter at bedside.  CSW and RNCM discussed that MD is starting to wean pt acute treatment and at this time it is not guaranteed that pt is going to meet three night qualifying stay for Medicare to cover SNF placement.   CSW and RNCM discussed that PT felt pt may be appropriate for CIR after PT treatment today, but CIR may not be an option because prior to admission pt did not have 24 hour supervision at home. Pt daughter had notified this CSW that she had FMLA for pt mother, but unsure how much FMLA pt daughter has remaining.   CSW provided SNF bed offers to pt daughter in the event that pt does meet three night qualifying stay for SNF. RNCM provided information re: private duty care, PACE, and home health agencies.   Pt daughter expressed frustration about Medicare guidelines as pt family does not have the financial means to pay privately for SNF for private duty care and pt daughter is extremely concerned about her mother being at home. Additionally, pt daughter has a daughter with a developmental disability which is an additional stressor to pt daughter. CSW provided emotional support to pt daughter as this is a very difficult situation for pt daughter.  Pt daughter states that if pt does not meet inpatient stay for SNF or qualify for CIR then pt family only option is for pt to return home, but pt daughter does not feel that this is a safe option for pt.   CSW will follow up with pt daughter tomorrow re: disposition planning and determination if pt is continuing to meet inpatient qualifying stay and follow up on recommendations from CIR.   CSW to continue to follow.   Jacklynn Lewis, MSW, LCSWA  Clinical Social Work [phone]

## 2013-04-19 NOTE — Progress Notes (Signed)
Physical Therapy Treatment Patient Details Name: [name] MRN: [id number] DOB: 03-Mar-1934 Today's Date: 04/19/2013 Time: 4098-1191 PT Time Calculation (min): 25 min  PT Assessment / Plan / Recommendation  History of Present Illness pt reports she feels better today   Clinical Impression Pt with good participation for whole session and was able to perform LE exercise better at end of session. Pt happy she could stand and weight bear on legs.   PT Comments   Pt will be able to tolerate 3 hours of therapy a day  Follow Up Recommendations  CIR     Does the patient have the potential to tolerate intense rehabilitation     Barriers to Discharge        Equipment Recommendations  Rolling walker with 5" wheels;Other (comment) (if directly home: HHPT, ade, ?hospital bed, hoyer lift)    Recommendations for Other Services Rehab consult  Frequency Min 3X/week   Progress towards PT Goals Progress towards PT goals: Progressing toward goals  Plan Discharge plan needs to be updated    Precautions / Restrictions Precautions Precautions: Fall Restrictions Weight Bearing Restrictions: No   Pertinent Vitals/Pain C/o pain in right knee with attempt to pivot to chair    Mobility  Bed Mobility Bed Mobility: Supine to Sit Rolling Right: Patient Percentage: 30% Supine to Sit: 3: Mod assist;HOB elevated Details for Bed Mobility Assistance: pt needs multimodal cues and assist for all bed mobility.  She has difficulty with rolling due to decreased strength and ROM in legs to assist Transfers Transfers: Sit to Stand;Stand to Sit;Stand Pivot Transfers Sit to Stand: 1: +2 Total assist;From elevated surface (with RW) Sit to Stand: Patient Percentage: 30% Stand to Sit: 1: +2 Total assist;To bed Stand to Sit: Patient Percentage: 30% Stand Pivot Transfers: 1: +2 Total assist Stand Pivot Transfers: Patient Percentage: 30% Details for Transfer Assistance: Pt is able to bear weight on legs , but has  difficulty activating glutes to extend hips.  She also had difficulty picking up feet to step pivot.  Recommend maximove pad for return to bed.    Exercises General Exercises - Lower Extremity Ankle Circles/Pumps: AROM;Both;5 reps;Supine Quad Sets: AROM;Both;5 reps;Supine Gluteal Sets: AROM;Both;5 reps;Supine;Seated Long Arc Quad: AROM;Supine;20 reps Hip Flexion/Marching: AROM;Both;AAROM;5 reps;Seated Other Exercises Other Exercises: trunk extension exercises in sitting   PT Diagnosis:    PT Problem List:   PT Treatment Interventions:     PT Goals (current goals can now be found in the care plan section)    Visit Information  Last PT Received On: 04/19/13 Assistance Needed: +2 History of Present Illness: pt reports she feels better today    Subjective Data      Cognition  Cognition Arousal/Alertness: Awake/alert Behavior During Therapy: WFL for tasks assessed/performed Overall Cognitive Status: Within Functional Limits for tasks assessed    Balance  Balance Balance Assessed: Yes Static Sitting Balance Static Sitting - Balance Support: No upper extremity supported;Feet supported Static Sitting - Level of Assistance: 5: Stand by assistance;6: Modified independent (Device/Increase time) Static Sitting - Comment/# of Minutes: fexed posture, but able to sit unsupported > 10 min for LE and core ex Static Standing Balance Static Standing - Balance Support: Bilateral upper extremity supported Static Standing - Level of Assistance: 1: +2 Total assist Static Standing - Comment/# of Minutes: standing with RW x 10 sec for 2 trials Pt ~ 50 % for standing  End of Session PT - End of Session Activity Tolerance: Patient tolerated treatment well Patient left:  in chair;with call bell/phone within reach Nurse Communication: Mobility status   GP     Donnetta Hail 04/19/2013, 12:01 PM

## 2013-04-19 NOTE — Consult Note (Signed)
Physical Medicine and Rehabilitation Consult Reason for Consult: Deconditioning/CAP Referring Physician: [facility]   HPI: [name] is a 79 y.o. right-handed female with history of COPD with chronic oxygen therapy, diastolic congestive heart failure and atrial fibrillation. Very sedentary prior to admission was able to ambulate from to bed to bedside commode. Admitted 04/18/2013 with generalized weakness and increasing shortness of breath as well grade fever. Chest x-ray suspicious for acute infiltrates on top of chronic changes. Placed on broad-spectrum antibiotics. Patient's chronic renal insufficiency with baseline creatinine 1.8-1.9. Subcutaneous heparin for DVT prophylaxis. Maintained on a regular diet with followup speech therapy to rule out aspiration. Physical therapy evaluation completed 04/18/2013 was noted deconditioning and easily short of breath with exertion. M.D. is requested physical medicine rehabilitation consult to consider inpatient rehabilitation services.   Review of Systems  Constitutional: Positive for fever.  HENT: Positive for congestion.   Respiratory: Positive for shortness of breath.   Gastrointestinal: Positive for constipation.       GERD  Musculoskeletal: Positive for back pain.  Neurological: Positive for weakness.  Psychiatric/Behavioral: Positive for depression.  All other systems reviewed and are negative.   Past Medical History  Diagnosis Date  . GERD 06/03/2007  . HYPERTENSION 06/03/2007  . OSTEOARTHRITIS 06/03/2007  . PEPTIC ULCER DISEASE 06/03/2007  . TRANSIENT ISCHEMIC ATTACK, HX OF 06/03/2007  . CONGESTIVE HEART FAILURE 06/03/2007    -2d echo 03/19/07 mild diastolic dysfunction, nl LA, RV  . DIABETES MELLITUS, TYPE II 06/03/2007  . LOW BACK PAIN 06/03/2007  . COPD 06/03/2007    Dr Ocie Doyne  . DEPRESSION 06/03/2007  . 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 Atrial fibrillation 08/10/2007  . HEMORRHOIDS 08/08/2009  . CONSTIPATION,  INTERMITTENT 02/15/2010  . DYSPNEA/SHORTNESS OF BREATH 06/09/2008  . INTERSTITIAL LUNG DISEASE 08/10/2007    -onset 12/03 with definite steroid responsive component -PFT's October 11, 2009: VC 67%, no airflow obstruction DLC0 14%  . Allergic rhinitis   . Diverticulosis 05/11/2011  . Goiter 05/11/2011  . Shingles 05/11/2011   Past Surgical History  Procedure Laterality Date  . Stomach tumors removed  08/22/1991  . Abdominal hysterectomy  10/31/1983    TAH   Family History  Problem Relation Age of Onset  . Hypertension Mother   . Hypertension Father   . Cancer Sister     Breast Cancer, Liver Cancer   Social History:  reports that she has never smoked. She has never used smokeless tobacco. She reports that she does not drink alcohol or use illicit drugs. Allergies:  Allergies  Allergen Reactions  . Levofloxacin Nausea Only    REACTION: nausea  . Ultram (Tramadol Hcl) Nausea Only   Medications Prior to Admission  Medication Sig Dispense Refill  . aspirin (ECOTRIN LOW STRENGTH) 81 MG EC tablet Take 81 mg by mouth daily.        . clotrimazole-betamethasone (LOTRISONE) cream Apply 1 application topically 2 (two) times daily.       . fexofenadine (ALLEGRA) 180 MG tablet Take 180 mg by mouth daily.        . furosemide (LASIX) 20 MG tablet Take 60 mg by mouth 2 (two) times daily.      Marland Kitchen glimepiride (AMARYL) 2 MG tablet Take 2 mg by mouth daily before breakfast.      . HYDROcodone-acetaminophen (NORCO/VICODIN) 5-325 MG per tablet Take 1 tablet by mouth every 6 (six) hours as needed for pain. With limit 4 tabs per day  120 tablet  2  .  lovastatin (MEVACOR) 40 MG tablet Take 40 mg by mouth at bedtime.      . Multiple Vitamin (MULTIVITAMIN) tablet Take 1 tablet by mouth daily.        . nebivolol (BYSTOLIC) 10 MG tablet Take 10 mg by mouth daily.      Marland Kitchen omeprazole (PRILOSEC) 20 MG capsule Take 20 mg by mouth daily.      . potassium chloride SA (K-DUR,KLOR-CON) 20 MEQ tablet Take 60 mEq by mouth  daily.      . predniSONE (DELTASONE) 10 MG tablet take 1 tablet by mouth once daily  30 tablet  3  . tiotropium (SPIRIVA) 18 MCG inhalation capsule Place 18 mcg into inhaler and inhale daily.        Home: Home Living Family/patient expects to be discharged to:: Skilled nursing facility Living Arrangements: Children  Functional History: Prior Function Comments: Pt reports she has not been able to walk Functional Status:  Mobility: Bed Mobility Bed Mobility: Supine to Sit Rolling Right: 1: +2 Total assist Rolling Right: Patient Percentage: 30% Rolling Left: 1: +2 Total assist Supine to Sit: 3: Mod assist;HOB elevated Sit to Supine: 2: Max assist;HOB flat Transfers Transfers: Sit to Stand;Stand to Sit Sit to Stand: 1: +2 Total assist Sit to Stand: Patient Percentage: 50% Stand to Sit: 1: +2 Total assist;To bed Stand to Sit: Patient Percentage: 50% Stand Pivot Transfers: 1: +2 Total assist Stand Pivot Transfers: Patient Percentage: 30% Ambulation/Gait Ambulation/Gait Assistance: 1: +2 Total assist    ADL:    Cognition: Cognition Overall Cognitive Status: Within Functional Limits for tasks assessed Orientation Level: Oriented X4 Cognition Arousal/Alertness: Awake/alert Behavior During Therapy: WFL for tasks assessed/performed Overall Cognitive Status: Within Functional Limits for tasks assessed  Blood pressure 158/108, pulse 80, temperature 98.5 F (36.9 C), temperature source Oral, resp. rate 18, height 5\' 8"  (1.727 m), weight 90.175 kg (198 lb 12.8 oz), SpO2 97.00%. Physical Exam  Vitals reviewed. Constitutional:  79 year old African American female with 3 L of oxygen in place per nasal cannula. obese  HENT:  Head: Normocephalic.  Eyes: EOM are normal.  Neck: Normal range of motion. Neck supple. No thyromegaly present.  Cardiovascular: Normal rate and regular rhythm.   Pulmonary/Chest:  Decreased breath sounds at the bases with limited inspiratory effort   Abdominal: Soft. Bowel sounds are normal. She exhibits no distension.  Musculoskeletal:  +1 edema lower extremities. Significant RTC pain in bilateral shoulders right greater than left which limits functional ROM. She could not abduct actively past 30-45 degrees.  Neurological: She is alert. She displays normal reflexes. No cranial nerve deficit.  Patient was able to follow simple commands. She was able to provide her name and age as well as place. Upper extremities 2-3 prox to 4/5 distally (pain). LE's 2+ prox to 3/5 distally. No sensory deficits. Cognitively appropriate.  Skin: Skin is warm and dry.    Results for orders placed during the hospital encounter of 04/17/13 (from the past 24 hour(s))  GLUCOSE, CAPILLARY     Status: Abnormal   Collection Time    04/18/13  5:11 PM      Result Value Range   Glucose-Capillary 141 (*) 70 - 99 mg/dL   Comment 1 Documented in Chart     Comment 2 Notify RN    GLUCOSE, CAPILLARY     Status: Abnormal   Collection Time    04/18/13 10:02 PM      Result Value Range   Glucose-Capillary 102 (*) 70 - 99  mg/dL   Comment 1 Notify RN    LEGIONELLA ANTIGEN, URINE     Status: None   Collection Time    04/19/13  2:40 AM      Result Value Range   Specimen Description URINE, RANDOM     Special Requests NONE     Legionella Antigen, Urine Negative for Legionella pneumophilia serogroup 1     Report Status 04/19/2013 FINAL    STREP PNEUMONIAE URINARY ANTIGEN     Status: None   Collection Time    04/19/13  2:40 AM      Result Value Range   Strep Pneumo Urinary Antigen NEGATIVE  NEGATIVE  GLUCOSE, CAPILLARY     Status: None   Collection Time    04/19/13  8:02 AM      Result Value Range   Glucose-Capillary 85  70 - 99 mg/dL  GLUCOSE, CAPILLARY     Status: Abnormal   Collection Time    04/19/13 11:35 AM      Result Value Range   Glucose-Capillary 148 (*) 70 - 99 mg/dL   Dg Chest 2 View  1/61/0960   *RADIOLOGY REPORT*  Clinical Data: Chest pain and  shortness of breath.  CHEST - 2 VIEW  Comparison: 01/02/2012  Findings: There may be subtle areas of acute infiltrate superimposed on severe chronic lung disease.  No pulmonary edema or pleural fluid is identified.  There is stable mild cardiomegaly.  IMPRESSION: Severe chronic lung disease with potential subtle areas of acute infiltrates bilaterally.   Original Report Authenticated By: Irish Lack, M.D.    Assessment/Plan: Diagnosis: deconditioning, FTT 1. Does the need for close, 24 hr/day medical supervision in concert with the patient's rehab needs make it unreasonable for this patient to be served in a less intensive setting? No 2. Co-Morbidities requiring supervision/potential complications: ckd, DM, COPD 3. Due to bladder management, bowel management, safety, skin/wound care, disease management, medication administration, pain management and patient education, does the patient require 24 hr/day rehab nursing? Potentially 4. Does the patient require coordinated care of a physician, rehab nurse, PT (1-2 hrs/day, 5 days/week) and OT (1-2 hrs/day, 5 days/week) to address physical and functional deficits in the context of the above medical diagnosis(es)? No and Potentially Addressing deficits in the following areas: balance, endurance, locomotion, strength, transferring, bowel/bladder control, bathing, dressing, feeding, grooming and toileting 5. Can the patient actively participate in an intensive therapy program of at least 3 hrs of therapy per day at least 5 days per week? No and Potentially 6. The potential for patient to make measurable gains while on inpatient rehab is poor 7. Anticipated functional outcomes upon discharge from inpatient rehab are n/a with PT, n/a with OT, n/a with SLP. 8. Estimated rehab length of stay to reach the above functional goals is: n/a 9. Does the patient have adequate social supports to accommodate these discharge functional goals? No 10. Anticipated D/C  setting: Home 11. Anticipated post D/C treatments: other 12. Overall Rehab/Functional Prognosis: fair  RECOMMENDATIONS: This patient's condition is appropriate for continued rehabilitative care in the following setting: SNF/ long term living arrangements Patient has agreed to participate in recommended program. Yes Note that insurance prior authorization may be required for reimbursement for recommended care.  Comment: Pt admits that she has been on decline for 7-8 months. Given that fact and considering her current medical conditions, a stay on inpatient rehab will not make a "measurable" change in her functional or medical status. Planning should begin for longer term  assistance.   Ranelle Oyster, MD, [facility] Physical Medicine & Rehabilitation     04/19/2013

## 2013-04-20 DIAGNOSIS — N189 Chronic kidney disease, unspecified: Secondary | ICD-10-CM

## 2013-04-20 DIAGNOSIS — R5381 Other malaise: Secondary | ICD-10-CM

## 2013-04-20 DIAGNOSIS — I5022 Chronic systolic (congestive) heart failure: Secondary | ICD-10-CM

## 2013-04-20 DIAGNOSIS — I509 Heart failure, unspecified: Secondary | ICD-10-CM

## 2013-04-20 DIAGNOSIS — I4891 Unspecified atrial fibrillation: Secondary | ICD-10-CM

## 2013-04-20 LAB — GLUCOSE, CAPILLARY
Glucose-Capillary: 139 mg/dL — ABNORMAL HIGH (ref 70–99)
Glucose-Capillary: 232 mg/dL — ABNORMAL HIGH (ref 70–99)

## 2013-04-20 MED ORDER — BUDESONIDE-FORMOTEROL FUMARATE 80-4.5 MCG/ACT IN AERO
2.0000 | INHALATION_SPRAY | Freq: Two times a day (BID) | RESPIRATORY_TRACT | Status: DC
  Filled 2013-04-20: qty 6.9

## 2013-04-20 MED ORDER — ALBUTEROL SULFATE HFA 108 (90 BASE) MCG/ACT IN AERS: 2.0000 | INHALATION_SPRAY | RESPIRATORY_TRACT | Status: DC | PRN

## 2013-04-20 MED ORDER — ALBUTEROL SULFATE (5 MG/ML) 0.5% IN NEBU: 2.5000 mg | INHALATION_SOLUTION | RESPIRATORY_TRACT | Status: DC | PRN

## 2013-04-20 MED ORDER — BUDESONIDE-FORMOTEROL FUMARATE 80-4.5 MCG/ACT IN AERO: 2.0000 | INHALATION_SPRAY | Freq: Two times a day (BID) | RESPIRATORY_TRACT | Status: DC

## 2013-04-20 MED ORDER — ALBUTEROL SULFATE HFA 108 (90 BASE) MCG/ACT IN AERS
2.0000 | INHALATION_SPRAY | RESPIRATORY_TRACT | Status: DC | PRN
  Filled 2013-04-20: qty 6.7

## 2013-04-20 NOTE — Progress Notes (Signed)
[facility]  [facility] is providing the following services: Bed, [employer], Tub Bench, RW  If patient discharges after hours, please call [phone].   Renard Hamper [phone] 04/20/2013, 1:42 PM

## 2013-04-20 NOTE — Progress Notes (Signed)
Pt discharging home with [facility] services as pt did not meet 3 night inpatient qualifying stay required for Medicare to cover SNF placement.  Per RNCM, pt needs ambulance transport home once equipment delivered to pt home.  CSW to provide RN documents needed for ambulance transport home and RN plans to arrange ambulance transport once she received notification that pt equipment has been delivered.  No further social work needs identified at this time.  CSW signing off.   Jacklynn Lewis, MSW, LCSWA  Clinical Social Work [phone]

## 2013-04-20 NOTE — Progress Notes (Signed)
Speech Language Pathology Dysphagia Treatment Patient Details Name: [name] MRN: [id number] DOB: [birth year] Today's Date: 04/20/2013 Time: 1440-1450 SLP Time Calculation (min): 10 min  Assessment / Plan / Recommendation Clinical Impression  Pt seen for education to aspiration precautions after clinical swallow evaluation completed two days prior and to assess tolerance of po diet.  Note pt to dc today home with [facility] therapies.  Pt reports good tolerance of po and intake has been good.  Pt denies coughing with intake.  She did recall to eat slowly and take small bites/sips but needed cues to recall other mitigation strategies.  Rec continue diet with strict aspiraiton/reflux precautions.  Pt noted to belch during session and point to chest, she denied significant issue with reflux and stated her belch was "dry".    All education is completed at this time, no further SLP indicated.      Diet Recommendation  Continue with Current Diet: Regular;Thin liquid    SLP Plan     Pertinent Vitals/Pain Afebrile, decreased   Swallowing Goals  SLP Swallowing Goals Swallow Study Goal #2 - Progress: Met  General Temperature Spikes Noted: No Respiratory Status: Supplemental O2 delivered via (comment) Behavior/Cognition: Alert;Cooperative;Pleasant mood Oral Cavity - Dentition: Dentures, top Patient Positioning: Upright in bed (leaning right)  Oral Cavity - Oral Hygiene Does patient have any of the following "at risk" factors?: Oxygen therapy - cannula, mask, simple oxygen devices Patient is AT RISK - Oral Care Protocol followed (see row info): Yes   Dysphagia Treatment Treatment focused on: Patient/family/caregiver education;Skilled observation of diet tolerance Family/Caregiver Educated: pt Treatment Methods/Modalities: Skilled observation Patient observed directly with PO's: Yes Type of PO's observed: Thin liquids Liquids provided via: Straw Type of cueing: Verbal Amount of cueing: Moderate  (to precautions secondary to cognitive deficits)   GO     Donavan Burnet, MS [facility] SLP [phone]

## 2013-04-20 NOTE — Progress Notes (Signed)
Rehab admissions - Evaluated for possible admission.  Please see rehab consult done by Dr. Riley Kill recommending SNF.  Call me for questions.  #[phone]

## 2013-04-20 NOTE — Progress Notes (Signed)
Patient discharged home, all discharge medications and instructions reviewed with daughter, Linnet Bottari, over the telephone. Copies to be sent with PTAR to patients home. Patient to be picked up from hospital once family calls with confirmation that hospital bed has been delivered to the patient's home.

## 2013-04-20 NOTE — Discharge Summary (Signed)
Physician Discharge Summary  [name] ZOX:[id number] DOB: Nov 26, 1933 DOA: 04/17/2013  PCP: Jade Barre, MD  Admit date: 04/17/2013 Discharge date: 04/20/2013  Recommendations for Outpatient Follow-up:  1. Home health PT/OT/RN/SW 2. Follow up with Dr. Sherene [name] and [facility] Cardiology within 1 month of discharge. 3. Primary care doctor within 1 week of discharge:  Follow up blood cultures which are pending  Discharge Diagnoses:  Principal Problem:   CAP (community acquired pneumonia) Active Problems:   DIABETES MELLITUS, TYPE II   CKD (chronic kidney disease), stage IV   Discharge Condition: stable, improved  Diet recommendation: healthy heart  Wt Readings from Last 3 Encounters:  04/18/13 90.175 kg (198 lb 12.8 oz)  01/26/12 98.657 kg (217 lb 8 oz)  12/26/11 100.5 kg (221 lb 9 oz)    History of present illness:   [name] is a 79 y.o. female who presents to the ED with c/o generalized weakness, per family they feel that she is SOB worse than usual for her COPD which is O2 dependent 2-3L home O2 at baseline (patient initially is denying this but then is admitting to mild increase in SOB). Patient states she has also been running low grade fevers at home.  In the ED work up showed low grade fever of 99.9 and CXR was suspicious for acute infiltrates on top of chronic changes. Patient given CAP treatment and admitted to hospitalist.  Hospital Course:   CAP (community acquired pneumonia):  [name] was admitted with CAP and started on ceftriaxone and azithromycin.  She remained on 3L [place] with oxygen saturations near 100%.  She remained afebrile and her WBC was 9.4 at admission.  Blood cultures are no growth to date.  She should continue antibiotics at home until they are gone. Swallow evaluation demonstrated no signs of aspiration.  She continued her medications for COPD and was felt not to have an exacerbation at this time.  She continued spiriva, daily prednisone.  I have added ICS/LABA and as  needed albuterol.    CKD (chronic kidney disease), stage IV, stable.  baseline cr. 1.8-2.0.  Continues lasix twice daily  DIABETES MELLITUS, TYPE II:  Stable on moderate dose SSI.  May resume home diabetes medications.    Chronic diastolic heart failure, grade 1, stable.  Mildly elevated BNP, but no evidence of vascular congestion or pulmonary edema on CXR and shortness of breath improved with antibiotics and she continued beta blocker and lasix.  Not on ACEI due to CKD.    Consultants:  none Procedures:  none Antibiotics:  Rocephin 7/27>7/28 Azithromycin 7/27 >> 7/31   Discharge Exam: Filed Vitals:   04/20/13 0505  BP: 139/81  Pulse: 69  Temp: 97.9 F (36.6 C)  Resp: 18   Filed Vitals:   04/19/13 1503 04/19/13 2112 04/20/13 0505 04/20/13 0806  BP: 128/69 139/83 139/81   Pulse:  81 69   Temp:  98.1 F (36.7 C) 97.9 F (36.6 C)   TempSrc:  Oral Oral   Resp:   18   Height:      Weight:      SpO2:  99% 100% 98%    General: AAF, no acute distress HEENT:  MMM Cardiovascular: RRR, no mrg, 2+ pulses, warm extremities Respiratory:  Diminished bilateral breath sounds, no focal rales or rhonchi ABD:  NABS, soft, nondistended, nontender MSK:  1+ LEE  Discharge Instructions      Discharge Orders   Future Orders Complete By Expires     (HEART  FAILURE PATIENTS) Call MD:  Anytime you have any of the following symptoms: 1) 3 pound weight gain in 24 hours or 5 pounds in 1 week 2) shortness of breath, with or without a dry hacking cough 3) swelling in the hands, feet or stomach 4) if you have to sleep on extra pillows at night in order to breathe.  As directed     Call MD for:  difficulty breathing, headache or visual disturbances  As directed     Call MD for:  extreme fatigue  As directed     Call MD for:  hives  As directed     Call MD for:  persistant dizziness or light-headedness  As directed     Call MD for:  persistant nausea and vomiting  As directed     Call MD for:   severe uncontrolled pain  As directed     Call MD for:  temperature >100.4  As directed     Diet - low sodium heart healthy  As directed     Diet - low sodium heart healthy  As directed     Discharge instructions  As directed     Comments:      [name] was admitted with shortness of breath and was found to have pneumonia.  She has completed 4 days of antibiotics in the hospital and states her breathing has improved.  She should continue antibiotics until they are gone.  She continued 3L of oxygen during this admission, the same as what she normally uses at home.  For her deconditioning, she would benefit from going to skilled nursing facility.  Home health services have been arranged as a temporizing measure and a social worker will help facilitate transition to skilled nursing from home.    Increase activity slowly  As directed     Increase activity slowly  As directed         Medication List         albuterol 108 (90 BASE) MCG/ACT inhaler  Commonly known as:  PROVENTIL HFA;VENTOLIN HFA  Inhale 2 puffs into the lungs every 4 (four) hours as needed for wheezing or shortness of breath.     azithromycin 500 MG tablet  Commonly known as:  ZITHROMAX  Take 1 tablet (500 mg total) by mouth daily.     budesonide-formoterol 80-4.5 MCG/ACT inhaler  Commonly known as:  SYMBICORT  Inhale 2 puffs into the lungs 2 (two) times daily.     clotrimazole-betamethasone cream  Commonly known as:  LOTRISONE  Apply 1 application topically 2 (two) times daily.     ECOTRIN LOW STRENGTH 81 MG EC tablet  Generic drug:  aspirin  Take 81 mg by mouth daily.     fexofenadine 180 MG tablet  Commonly known as:  ALLEGRA  Take 180 mg by mouth daily.     furosemide 20 MG tablet  Commonly known as:  LASIX  Take 60 mg by mouth 2 (two) times daily.     glimepiride 2 MG tablet  Commonly known as:  AMARYL  Take 2 mg by mouth daily before breakfast.     HYDROcodone-acetaminophen 5-325 MG per tablet  Commonly  known as:  NORCO/VICODIN  Take 1 tablet by mouth every 6 (six) hours as needed for pain. With limit 4 tabs per day     lovastatin 40 MG tablet  Commonly known as:  MEVACOR  Take 40 mg by mouth at bedtime.     multivitamin tablet  Take 1 tablet by mouth daily.     nebivolol 10 MG tablet  Commonly known as:  BYSTOLIC  Take 10 mg by mouth daily.     omeprazole 20 MG capsule  Commonly known as:  PRILOSEC  Take 20 mg by mouth daily.     potassium chloride SA 20 MEQ tablet  Commonly known as:  K-DUR,KLOR-CON  Take 60 mEq by mouth daily.     predniSONE 10 MG tablet  Commonly known as:  DELTASONE  take 1 tablet by mouth once daily     tiotropium 18 MCG inhalation capsule  Commonly known as:  SPIRIVA  Place 18 mcg into inhaler and inhale daily.       Follow-up Information   Follow up with Jade Barre, MD In 1 week. (hospital follow up)    Contact information:   [address] [place] [address] [phone]       Follow up with Sandrea Hughs, MD. Schedule an appointment as soon as possible for a visit in 1 month.   Contact information:   [address] [place] [address] [phone]       Follow up with [facility]). Schedule an appointment as soon as possible for a visit in 1 month. (As needed)    Contact information:   [address], [address] [place] [address] [phone]       The results of significant diagnostics from this hospitalization (including imaging, microbiology, ancillary and laboratory) are listed below for reference.    Significant Diagnostic Studies: Dg Chest 2 View  04/17/2013   *RADIOLOGY REPORT*  Clinical Data: Chest pain and shortness of breath.  CHEST - 2 VIEW  Comparison: 01/02/2012  Findings: There may be subtle areas of acute infiltrate superimposed on severe chronic lung disease.  No pulmonary edema or pleural fluid is identified.  There is stable mild cardiomegaly.  IMPRESSION: Severe  chronic lung disease with potential subtle areas of acute infiltrates bilaterally.   Original Report Authenticated By: Irish Lack, M.D.    Microbiology: Recent Results (from the past 240 hour(s))  CULTURE, BLOOD (ROUTINE X 2)     Status: None   Collection Time    04/18/13  5:01 AM      Result Value Range Status   Specimen Description BLOOD LEFT FOREARM   Final   Special Requests BOTTLES DRAWN AEROBIC ONLY 8CC   Final   Culture  Setup Time 04/18/2013 10:41   Final   Culture     Final   Value:        BLOOD CULTURE RECEIVED NO GROWTH TO DATE CULTURE WILL BE HELD FOR 5 DAYS BEFORE ISSUING A FINAL NEGATIVE REPORT   Report Status PENDING   Incomplete  CULTURE, BLOOD (ROUTINE X 2)     Status: None   Collection Time    04/18/13  5:07 AM      Result Value Range Status   Specimen Description BLOOD LEFT HAND   Final   Special Requests BOTTLES DRAWN AEROBIC ONLY 2CC   Final   Culture  Setup Time 04/18/2013 10:41   Final   Culture     Final   Value:        BLOOD CULTURE RECEIVED NO GROWTH TO DATE CULTURE WILL BE HELD FOR 5 DAYS BEFORE ISSUING A FINAL NEGATIVE REPORT   Report Status PENDING   Incomplete     Labs: Basic Metabolic Panel:  Recent Labs Lab 04/17/13  2219  NA 138  K 4.3  CL 100  CO2 31  GLUCOSE 157*  BUN 35*  CREATININE 1.92*  CALCIUM 8.7   Liver Function Tests:  Recent Labs Lab 04/17/13 2219  AST 20  ALT 14  ALKPHOS 51  BILITOT 0.5  PROT 6.5  ALBUMIN 3.0*   No results found for this basename: LIPASE, AMYLASE,  in the last 168 hours No results found for this basename: AMMONIA,  in the last 168 hours CBC:  Recent Labs Lab 04/17/13 2219  WBC 9.4  HGB 10.8*  HCT 34.3*  MCV 91.2  PLT 154   Cardiac Enzymes:  Recent Labs Lab 04/17/13 2219  TROPONINI <0.30   BNP: BNP (last 3 results)  Recent Labs  04/17/13 2219  PROBNP 1313.0*   CBG:  Recent Labs Lab 04/19/13 1135 04/19/13 1705 04/19/13 2137 04/20/13 0741 04/20/13 1141  GLUCAP 148*  225* 133* 83 139*    Time coordinating discharge: 45 minutes  Signed:  Jenner Rosier  [facility] 04/20/2013, 12:22 PM

## 2013-04-21 MED ORDER — VITAMINS A & D EX OINT
TOPICAL_OINTMENT | CUTANEOUS | Status: AC
  Filled 2013-04-21: qty 5

## 2013-04-24 LAB — CULTURE, BLOOD (ROUTINE X 2)

## 2013-04-25 ENCOUNTER — Other Ambulatory Visit: Payer: Self-pay | Admitting: Internal Medicine

## 2013-04-29 DIAGNOSIS — E1129 Type 2 diabetes mellitus with other diabetic kidney complication: Secondary | ICD-10-CM

## 2013-04-29 DIAGNOSIS — J8410 Pulmonary fibrosis, unspecified: Secondary | ICD-10-CM

## 2013-04-29 DIAGNOSIS — N184 Chronic kidney disease, stage 4 (severe): Secondary | ICD-10-CM

## 2013-04-29 DIAGNOSIS — J441 Chronic obstructive pulmonary disease with (acute) exacerbation: Secondary | ICD-10-CM

## 2013-05-04 ENCOUNTER — Other Ambulatory Visit: Payer: Self-pay

## 2013-05-04 MED ORDER — GLIMEPIRIDE 2 MG PO TABS: 2.0000 mg | ORAL_TABLET | Freq: Every day | ORAL | Status: DC

## 2013-05-11 ENCOUNTER — Telehealth: Payer: Self-pay

## 2013-05-11 ENCOUNTER — Other Ambulatory Visit (INDEPENDENT_AMBULATORY_CARE_PROVIDER_SITE_OTHER): Payer: Medicare Other

## 2013-05-11 ENCOUNTER — Ambulatory Visit (INDEPENDENT_AMBULATORY_CARE_PROVIDER_SITE_OTHER): Payer: Medicare Other | Admitting: Internal Medicine

## 2013-05-11 ENCOUNTER — Encounter: Payer: Self-pay | Admitting: Internal Medicine

## 2013-05-11 VITALS — BP 130/98 | HR 83 | Temp 97.0°F | Wt 198.0 lb

## 2013-05-11 DIAGNOSIS — E119 Type 2 diabetes mellitus without complications: Secondary | ICD-10-CM

## 2013-05-11 DIAGNOSIS — F329 Major depressive disorder, single episode, unspecified: Secondary | ICD-10-CM

## 2013-05-11 DIAGNOSIS — I10 Essential (primary) hypertension: Secondary | ICD-10-CM

## 2013-05-11 LAB — BASIC METABOLIC PANEL
CO2: 36 mEq/L — ABNORMAL HIGH (ref 19–32)
Chloride: 99 mEq/L (ref 96–112)
Glucose, Bld: 109 mg/dL — ABNORMAL HIGH (ref 70–99)
Sodium: 142 mEq/L (ref 135–145)

## 2013-05-11 LAB — HEPATIC FUNCTION PANEL
ALT: 29 U/L (ref 0–35)
Alkaline Phosphatase: 63 U/L (ref 39–117)
Bilirubin, Direct: 0.1 mg/dL (ref 0.0–0.3)
Total Protein: 7.3 g/dL (ref 6.0–8.3)

## 2013-05-11 LAB — LIPID PANEL
Total CHOL/HDL Ratio: 2
Triglycerides: 89 mg/dL (ref 0.0–149.0)

## 2013-05-11 LAB — HEMOGLOBIN A1C: Hgb A1c MFr Bld: 6.6 % — ABNORMAL HIGH (ref 4.6–6.5)

## 2013-05-11 MED ORDER — CLOTRIMAZOLE-BETAMETHASONE 1-0.05 % EX CREA: 1.0000 "application " | TOPICAL_CREAM | Freq: Two times a day (BID) | CUTANEOUS | Status: DC

## 2013-05-11 MED ORDER — TRIAMCINOLONE ACETONIDE 0.1 % EX CREA: TOPICAL_CREAM | Freq: Two times a day (BID) | CUTANEOUS | Status: DC

## 2013-05-11 NOTE — Telephone Encounter (Signed)
Pharmacy requesting an alternative to Clotrimazole betamethasone crm as not covered

## 2013-05-11 NOTE — Assessment & Plan Note (Signed)
stable overall by history and exam, recent data reviewed with pt, and pt to continue medical treatment as before,  to f/u any worsening symptoms or concerns BP Readings from Last 3 Encounters:  05/11/13 130/98  04/20/13 153/70  10/01/12 120/82

## 2013-05-11 NOTE — Assessment & Plan Note (Signed)
stable overall by history and exam, recent data reviewed with pt, and pt to continue medical treatment as before,  to f/u any worsening symptoms or concerns, for small snack qhs to avoid low AM sugars

## 2013-05-11 NOTE — Progress Notes (Addendum)
Subjective:    Patient ID: [name], female    DOB: 16-Sep-1934, 79 y.o.   MRN: [id number]  HPI here to f/u, was recently hospd late July,,  Pt denies chest pain, increased sob or doe, wheezing, orthopnea, PND, increased LE swelling, palpitations, dizziness or syncope.  Pt denies polydipsia, polyuria,.  Pt denies new neurological symptoms such as new headache, or facial or extremity weakness or numbness.   Pt states overall good compliance with meds, has been trying to follow lower cholesterol, diabetic diet, with wt overall stable,  but little exercise however. Last 2 days had low sugar 50's with mild symptoms, has not eaten well last 2 days.  No new complaints.   Has hoyer lift at home, and wheelchair/bed bound. Son cont's to be supportive.  Had private transport bring her here today - for $87. Asks for refill cream for leg rash that worked well before.  Denies worsening depressive symptoms, suicidal ideation, or panic Past Medical History  Diagnosis Date  . GERD 06/03/2007  . HYPERTENSION 06/03/2007  . OSTEOARTHRITIS 06/03/2007  . PEPTIC ULCER DISEASE 06/03/2007  . TRANSIENT ISCHEMIC ATTACK, HX OF 06/03/2007  . CONGESTIVE HEART FAILURE 06/03/2007    -2d echo 03/19/07 mild diastolic dysfunction, nl LA, RV  . DIABETES MELLITUS, TYPE II 06/03/2007  . LOW BACK PAIN 06/03/2007  . COPD 06/03/2007    Dr Ocie Doyne  . DEPRESSION 06/03/2007  . 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 Atrial fibrillation 08/10/2007  . HEMORRHOIDS 08/08/2009  . CONSTIPATION, INTERMITTENT 02/15/2010  . DYSPNEA/SHORTNESS OF BREATH 06/09/2008  . INTERSTITIAL LUNG DISEASE 08/10/2007    -onset 12/03 with definite steroid responsive component -PFT's October 11, 2009: VC 67%, no airflow obstruction DLC0 14%  . Allergic rhinitis   . Diverticulosis 05/11/2011  . Goiter 05/11/2011  . Shingles 05/11/2011   Past Surgical History  Procedure Laterality Date  . Stomach tumors removed  08/22/1991  . Abdominal hysterectomy   10/31/1983    TAH    reports that she has never smoked. She has never used smokeless tobacco. She reports that she does not drink alcohol or use illicit drugs. family history includes Cancer in her sister; Hypertension in her father and mother. Allergies  Allergen Reactions  . Levofloxacin Nausea Only    REACTION: nausea  . Ultram [Tramadol Hcl] Nausea Only   Current Outpatient Prescriptions on File Prior to Visit  Medication Sig Dispense Refill  . albuterol (PROVENTIL HFA;VENTOLIN HFA) 108 (90 BASE) MCG/ACT inhaler Inhale 2 puffs into the lungs every 4 (four) hours as needed for wheezing or shortness of breath.  1 Inhaler  0  . aspirin (ECOTRIN LOW STRENGTH) 81 MG EC tablet Take 81 mg by mouth daily.        . budesonide-formoterol (SYMBICORT) 80-4.5 MCG/ACT inhaler Inhale 2 puffs into the lungs 2 (two) times daily.  1 Inhaler  0  . fexofenadine (ALLEGRA) 180 MG tablet Take 180 mg by mouth daily.        . furosemide (LASIX) 20 MG tablet Take 60 mg by mouth 2 (two) times daily.      Marland Kitchen glimepiride (AMARYL) 2 MG tablet Take 1 tablet (2 mg total) by mouth daily before breakfast.  90 tablet  3  . HYDROcodone-acetaminophen (NORCO/VICODIN) 5-325 MG per tablet Take 1 tablet by mouth every 6 (six) hours as needed for pain. With limit 4 tabs per day  120 tablet  2  . lovastatin (MEVACOR) 40 MG tablet Take 40 mg  by mouth at bedtime.      . Multiple Vitamin (MULTIVITAMIN) tablet Take 1 tablet by mouth daily.        . nebivolol (BYSTOLIC) 10 MG tablet Take 10 mg by mouth daily.      Marland Kitchen omeprazole (PRILOSEC) 20 MG capsule TAKE ONE (1) CAPSULE EACHDAY  30 capsule  11  . potassium chloride SA (K-DUR,KLOR-CON) 20 MEQ tablet Take 60 mEq by mouth daily.      . predniSONE (DELTASONE) 10 MG tablet take 1 tablet by mouth once daily  30 tablet  3  . tiotropium (SPIRIVA) 18 MCG inhalation capsule Place 18 mcg into inhaler and inhale daily.      . TRUETEST TEST test strip USE AS DIRECTED FOUR     TIMES DAILY  100  each  10   No current facility-administered medications on file prior to visit.   Review of Systems  Constitutional: Negative for unexpected weight change, or unusual diaphoresis  HENT: Negative for tinnitus.   Eyes: Negative for photophobia and visual disturbance.  Respiratory: Negative for choking and stridor.   Gastrointestinal: Negative for vomiting and blood in stool.  Genitourinary: Negative for hematuria and decreased urine volume.  Musculoskeletal: Negative for acute joint swelling Skin: Negative for color change and wound.  Neurological: Negative for tremors and numbness other than noted  Psychiatric/Behavioral: Negative for decreased concentration or  hyperactivity.       Objective:   Physical Exam BP 130/98  Pulse 83  Temp(Src) 97 F (36.1 C) (Oral)  Wt 198 lb (89.812 kg)  BMI 30.11 kg/m2  SpO2 97% VS noted, in wheelchair Constitutional: Pt appears well-developed and well-nourished.  HENT: Head: NCAT.  Right Ear: External ear normal.  Left Ear: External ear normal.  Eyes: Conjunctivae and EOM are normal. Pupils are equal, round, and reactive to light.  Neck: Normal range of motion. Neck supple.  Cardiovascular: Normal rate and regular rhythm.   Pulmonary/Chest: Effort normal and breath sounds normal.  Abd:  Soft, NT, non-distended, + BS Neurological: Pt is alert. Not confused  Skin: Skin is warm. No erythema.  Psychiatric: Pt behavior is normal. Thought content normal. mild nervous, not depressed affect    Assessment & Plan:  Quality Measures addressed:  CAD  - drug therapy for lower cholesterol: pt declines further medication ACE/ARB therapy for CAD, Diabetes, and/or LVSD: pt declines further medication

## 2013-05-11 NOTE — Assessment & Plan Note (Signed)
stable overall by history and exam, recent data reviewed with pt, and pt to continue medical treatment as before,  to f/u any worsening symptoms or concerns Lab Results  Component Value Date   WBC 9.4 04/17/2013   HGB 10.8* 04/17/2013   HCT 34.3* 04/17/2013   PLT 154 04/17/2013   GLUCOSE 109* 05/11/2013   CHOL 167 05/11/2013   TRIG 89.0 05/11/2013   HDL 69.60 05/11/2013   LDLCALC 80 05/11/2013   ALT 29 05/11/2013   AST 27 05/11/2013   NA 142 05/11/2013   K 3.8 05/11/2013   CL 99 05/11/2013   CREATININE 1.5* 05/11/2013   BUN 30* 05/11/2013   CO2 36* 05/11/2013   TSH 1.161 12/25/2011   INR 1.09 12/26/2011   HGBA1C 6.6* 05/11/2013   MICROALBUR 2.0* 03/14/2011

## 2013-05-11 NOTE — Patient Instructions (Addendum)
Please remember to do a SCAT bus application (the number should be in the phone book) Please continue all other medications as before, and refills have been done if requested. Please have the pharmacy call with any other refills you may need. Remember to have a small snack at bedtime to avoid lower sugars in the AM The cream has been refilled Please go to the LAB in the Basement (turn left off the elevator) for the tests to be done today You will be contacted by phone if any changes need to be made immediately.  Otherwise, you will receive a letter about your results with an explanation, but please check with MyChart first.  Please remember to sign up for My Chart if you have not done so, as this will be important to you in the future with finding out test results, communicating by private email, and scheduling acute appointments online when needed.  Please return in 6 months, or sooner if needed

## 2013-05-11 NOTE — Telephone Encounter (Signed)
Ok for change to triam cr - done erx

## 2013-05-13 ENCOUNTER — Other Ambulatory Visit: Payer: Self-pay | Admitting: Internal Medicine

## 2013-05-26 ENCOUNTER — Other Ambulatory Visit: Payer: Self-pay | Admitting: Internal Medicine

## 2013-06-21 ENCOUNTER — Telehealth: Payer: Self-pay | Admitting: Internal Medicine

## 2013-06-21 MED ORDER — BENZONATATE 100 MG PO CAPS: ORAL_CAPSULE | ORAL | Status: DC

## 2013-06-21 NOTE — Telephone Encounter (Signed)
Pt request Dr. Jonny Ruiz to call in some cough medicine. Please advise.

## 2013-06-21 NOTE — Telephone Encounter (Signed)
Since pt already on hydrocodone pills, ok for tessalon perle prn

## 2013-06-22 NOTE — Telephone Encounter (Signed)
Called the patient informed the daughter rx sent in.

## 2013-07-13 ENCOUNTER — Telehealth: Payer: Self-pay | Admitting: *Deleted

## 2013-07-13 NOTE — Telephone Encounter (Signed)
Spoke to patients son and informed of MD instructions.

## 2013-07-13 NOTE — Telephone Encounter (Signed)
caryle called states pt has a bleeding sore on her left leg.  Unsure when pt injured her leg, wound is small enough to cover with band-aide.  Requesting wound care instructions.  Please advise

## 2013-07-13 NOTE — Telephone Encounter (Signed)
Ok to use neosporin with bandaid for now, keep leg elevated only with sitting, and bandaid ok until healed

## 2013-07-25 ENCOUNTER — Telehealth: Payer: Self-pay | Admitting: Internal Medicine

## 2013-07-25 NOTE — Telephone Encounter (Signed)
Patient Information:  Caller Name: Kinnie Feil  Phone: [phone]  Patient: [name], [name]  Gender: Female  DOB: 11/21/1933  Age: 79 Years  PCP: Oliver Barre (Adults only)  Office Follow Up:  Does the office need to follow up with this patient?: No  Instructions For The Office: N/A  RN Note:  Unable to obtain enough symptoms to triage effectively. Advised her son it is better that she be seen in the office with all of her health conditions. Her electrolytes could be off. Will speak with his sister and she will call back to schedule an appt., as she will be the one to bring her into the office. Call back parameters given.  Symptoms  Reason For Call & Symptoms: C/o nausea. Appetite off. Eating gingerale and soup. Has not vomited. No pain with urination. No chest pain.  Reviewed Health History In EMR: Yes  Reviewed Medications In EMR: Yes  Reviewed Allergies In EMR: Yes  Reviewed Surgeries / Procedures: Yes  Date of Onset of Symptoms: 07/23/2013  Treatments Tried: Changing her diet  Treatments Tried Worked: No  Guideline(s) Used:  Vomiting  No Protocol Available - Sick Adult  Disposition Per Guideline:   See Within 3 Days in Office  Reason For Disposition Reached:   Nursing judgment  Advice Given:  Call Back If:  New symptoms develop  You become worse.  Patient Will Follow Care Advice:  YES

## 2013-07-28 ENCOUNTER — Other Ambulatory Visit: Payer: Self-pay

## 2013-08-31 ENCOUNTER — Ambulatory Visit: Payer: Medicare Other

## 2013-08-31 DIAGNOSIS — Z23 Encounter for immunization: Secondary | ICD-10-CM

## 2013-09-19 ENCOUNTER — Other Ambulatory Visit: Payer: Self-pay | Admitting: Internal Medicine

## 2013-09-26 ENCOUNTER — Other Ambulatory Visit: Payer: Self-pay | Admitting: Internal Medicine

## 2013-10-27 ENCOUNTER — Other Ambulatory Visit: Payer: Self-pay | Admitting: Internal Medicine

## 2013-11-24 ENCOUNTER — Other Ambulatory Visit: Payer: Self-pay | Admitting: Internal Medicine

## 2014-01-04 ENCOUNTER — Other Ambulatory Visit: Payer: Self-pay

## 2014-01-04 MED ORDER — LOVASTATIN 40 MG PO TABS: 40.0000 mg | ORAL_TABLET | Freq: Every day | ORAL | Status: AC

## 2014-01-17 ENCOUNTER — Other Ambulatory Visit: Payer: Self-pay | Admitting: Internal Medicine

## 2014-02-10 ENCOUNTER — Ambulatory Visit: Payer: Medicare Other | Admitting: Internal Medicine

## 2014-02-15 ENCOUNTER — Ambulatory Visit: Payer: Medicare Other | Admitting: Internal Medicine

## 2014-02-24 DIAGNOSIS — Z0279 Encounter for issue of other medical certificate: Secondary | ICD-10-CM

## 2014-03-10 ENCOUNTER — Ambulatory Visit (INDEPENDENT_AMBULATORY_CARE_PROVIDER_SITE_OTHER): Payer: Medicare Other | Admitting: Internal Medicine

## 2014-03-10 ENCOUNTER — Other Ambulatory Visit (INDEPENDENT_AMBULATORY_CARE_PROVIDER_SITE_OTHER): Payer: Medicare Other

## 2014-03-10 ENCOUNTER — Encounter: Payer: Self-pay | Admitting: Internal Medicine

## 2014-03-10 VITALS — BP 132/82 | HR 85 | Temp 98.3°F

## 2014-03-10 DIAGNOSIS — Z23 Encounter for immunization: Secondary | ICD-10-CM

## 2014-03-10 DIAGNOSIS — J449 Chronic obstructive pulmonary disease, unspecified: Secondary | ICD-10-CM

## 2014-03-10 DIAGNOSIS — I5032 Chronic diastolic (congestive) heart failure: Secondary | ICD-10-CM

## 2014-03-10 DIAGNOSIS — E119 Type 2 diabetes mellitus without complications: Secondary | ICD-10-CM

## 2014-03-10 DIAGNOSIS — I10 Essential (primary) hypertension: Secondary | ICD-10-CM

## 2014-03-10 LAB — LIPID PANEL
Cholesterol: 146 mg/dL (ref 0–200)
HDL: 56.1 mg/dL (ref >39.00)
LDL Cholesterol: 65 mg/dL (ref 0–99)
NonHDL: 89.9
Total CHOL/HDL Ratio: 3
Triglycerides: 127 mg/dL (ref 0.0–149.0)
VLDL: 25.4 mg/dL (ref 0.0–40.0)

## 2014-03-10 LAB — HEMOGLOBIN A1C: Hgb A1c MFr Bld: 7.6 % — ABNORMAL HIGH (ref 4.6–6.5)

## 2014-03-10 LAB — CBC WITH DIFFERENTIAL/PLATELET
Basophils Absolute: 0 10*3/uL (ref 0.0–0.1)
Basophils Relative: 0.5 % (ref 0.0–3.0)
EOS ABS: 0.2 10*3/uL (ref 0.0–0.7)
Eosinophils Relative: 2.5 % (ref 0.0–5.0)
HCT: 36.7 % (ref 36.0–46.0)
Hemoglobin: 11.9 g/dL — ABNORMAL LOW (ref 12.0–15.0)
Lymphocytes Relative: 31.1 % (ref 12.0–46.0)
Lymphs Abs: 2.2 10*3/uL (ref 0.7–4.0)
MCHC: 32.3 g/dL (ref 30.0–36.0)
MCV: 91.2 fl (ref 78.0–100.0)
MONO ABS: 0.5 10*3/uL (ref 0.1–1.0)
Monocytes Relative: 7 % (ref 3.0–12.0)
NEUTROS PCT: 58.9 % (ref 43.0–77.0)
Neutro Abs: 4.1 10*3/uL (ref 1.4–7.7)
PLATELETS: 179 10*3/uL (ref 150.0–400.0)
RBC: 4.03 Mil/uL (ref 3.87–5.11)
RDW: 16.4 % — AB (ref 11.5–15.5)
WBC: 7 10*3/uL (ref 4.0–10.5)

## 2014-03-10 LAB — HEPATIC FUNCTION PANEL
ALT: 35 U/L (ref 0–35)
AST: 47 U/L — ABNORMAL HIGH (ref 0–37)
Albumin: 3.3 g/dL — ABNORMAL LOW (ref 3.5–5.2)
Alkaline Phosphatase: 58 U/L (ref 39–117)
Bilirubin, Direct: 0.3 mg/dL (ref 0.0–0.3)
TOTAL PROTEIN: 6.8 g/dL (ref 6.0–8.3)
Total Bilirubin: 0.7 mg/dL (ref 0.2–1.2)

## 2014-03-10 LAB — BASIC METABOLIC PANEL
BUN: 32 mg/dL — ABNORMAL HIGH (ref 6–23)
CALCIUM: 8.7 mg/dL (ref 8.4–10.5)
CHLORIDE: 104 meq/L (ref 96–112)
CO2: 30 meq/L (ref 19–32)
Creatinine, Ser: 1.3 mg/dL — ABNORMAL HIGH (ref 0.4–1.2)
GFR: 50.48 mL/min — ABNORMAL LOW (ref >60.00)
Glucose, Bld: 123 mg/dL — ABNORMAL HIGH (ref 70–99)
Potassium: 3.3 mEq/L — ABNORMAL LOW (ref 3.5–5.1)
SODIUM: 144 meq/L (ref 135–145)

## 2014-03-10 LAB — TSH: TSH: 0.32 u[IU]/mL — AB (ref 0.35–4.50)

## 2014-03-10 MED ORDER — NEBIVOLOL HCL 10 MG PO TABS: 10.0000 mg | ORAL_TABLET | Freq: Every day | ORAL | Status: DC

## 2014-03-10 MED ORDER — FUROSEMIDE 20 MG PO TABS: ORAL_TABLET | ORAL | Status: DC

## 2014-03-10 NOTE — Progress Notes (Signed)
Subjective:    Patient ID: [name], female    DOB: Aug 14, 1934, 80 y.o.   MRN: [id number]  HPI  Here for yearly f/u;  Overall doing ok;  Pt denies CP, worsening SOB, DOE, wheezing, orthopnea, PND, worsening LE edema, palpitations, dizziness or syncope.  Pt denies neurological change such as new headache, facial or extremity weakness.  Pt denies polydipsia, polyuria, or low sugar symptoms. Pt states overall good compliance with treatment and medications, good tolerability, and has been trying to follow lower cholesterol diet.  Pt denies worsening depressive symptoms, suicidal ideation or panic. No fever, night sweats, wt loss, loss of appetite, or other constitutional symptoms.  Pt states good ability with ADL's, has high fall risk, home safety reviewed and adequate, no other significant changes in hearing or vision. Now essentially bedbound with some wheelchair use. Cannot stand,. On chronic 4 L home o2.  Son lives with her, works Warehouse manager, pt has 24/7 care with sister with her when son is working, usually legs kept elev in the bed, no swelling to legs for over 1 yr.   Does not see specialists such as card or pulm due to transporation issues and stable symtpoms. Past Medical History  Diagnosis Date  . GERD 06/03/2007  . HYPERTENSION 06/03/2007  . OSTEOARTHRITIS 06/03/2007  . PEPTIC ULCER DISEASE 06/03/2007  . TRANSIENT ISCHEMIC ATTACK, HX OF 06/03/2007  . CONGESTIVE HEART FAILURE 06/03/2007    -2d echo 2/63/78 mild diastolic dysfunction, nl LA, RV  . DIABETES MELLITUS, TYPE II 06/03/2007  . LOW BACK PAIN 06/03/2007  . COPD 06/03/2007    Dr Wende Mott  . DEPRESSION 06/03/2007  . 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 Atrial fibrillation 08/10/2007  . HEMORRHOIDS 08/08/2009  . CONSTIPATION, INTERMITTENT 02/15/2010  . DYSPNEA/SHORTNESS OF BREATH 06/09/2008  . INTERSTITIAL LUNG DISEASE 08/10/2007    -onset 12/03 with definite steroid responsive component -PFT's October 11, 2009: VC 67%, no  airflow obstruction DLC0 14%  . Allergic rhinitis   . Diverticulosis 05/11/2011  . Goiter 05/11/2011  . Shingles 05/11/2011   Past Surgical History  Procedure Laterality Date  . Stomach tumors removed  08/22/1991  . Abdominal hysterectomy  10/31/1983    TAH    reports that she has never smoked. She has never used smokeless tobacco. She reports that she does not drink alcohol or use illicit drugs. family history includes Cancer in her sister; Hypertension in her father and mother. Allergies  Allergen Reactions  . Levofloxacin Nausea Only    REACTION: nausea  . Ultram [Tramadol Hcl] Nausea Only   Current Outpatient Prescriptions on File Prior to Visit  Medication Sig Dispense Refill  . albuterol (PROVENTIL HFA;VENTOLIN HFA) 108 (90 BASE) MCG/ACT inhaler Inhale 2 puffs into the lungs every 4 (four) hours as needed for wheezing or shortness of breath.  1 Inhaler  0  . aspirin (ECOTRIN LOW STRENGTH) 81 MG EC tablet Take 81 mg by mouth daily.        . benzonatate (TESSALON) 100 MG capsule 1-2 tabs by mouth every 6 hrs as needed  60 capsule  1  . budesonide-formoterol (SYMBICORT) 80-4.5 MCG/ACT inhaler Inhale 2 puffs into the lungs 2 (two) times daily.  1 Inhaler  0  . BYSTOLIC 10 MG tablet take 1 tablet by mouth once daily  30 tablet  11  . fexofenadine (ALLEGRA) 180 MG tablet Take 180 mg by mouth daily.        . furosemide (LASIX) 20 MG  tablet Take 60 mg by mouth 2 (two) times daily.      . furosemide (LASIX) 20 MG tablet take 3 tablets by mouth twice a day  180 tablet  4  . glimepiride (AMARYL) 2 MG tablet Take 1 tablet (2 mg total) by mouth daily before breakfast.  90 tablet  3  . HYDROcodone-acetaminophen (NORCO/VICODIN) 5-325 MG per tablet Take 1 tablet by mouth every 6 (six) hours as needed for pain. With limit 4 tabs per day  120 tablet  2  . lovastatin (MEVACOR) 40 MG tablet Take 1 tablet (40 mg total) by mouth at bedtime.  90 tablet  3  . Multiple Vitamin (MULTIVITAMIN) tablet Take 1  tablet by mouth daily.        Marland Kitchen omeprazole (PRILOSEC) 20 MG capsule TAKE ONE (1) CAPSULE EACHDAY  30 capsule  11  . potassium chloride SA (K-DUR,KLOR-CON) 20 MEQ tablet Take 60 mEq by mouth daily.      . potassium chloride SA (K-DUR,KLOR-CON) 20 MEQ tablet take 3 tablets by mouth once daily  90 tablet  3  . predniSONE (DELTASONE) 10 MG tablet take 1 tablet by mouth once daily  30 tablet  3  . SPIRIVA HANDIHALER 18 MCG inhalation capsule INHALE CONTENTS OF ONE   CAPSULE IN HANDIHALER    ONCE DAILY  30 capsule  11  . tiotropium (SPIRIVA) 18 MCG inhalation capsule Place 18 mcg into inhaler and inhale daily.      Marland Kitchen triamcinolone cream (KENALOG) 0.1 % Apply topically 2 (two) times daily.  30 g  0  . TRUETEST TEST test strip USE AS DIRECTED FOUR     TIMES DAILY  100 each  10   No current facility-administered medications on file prior to visit.   Review of Systems Constitutional: Negative for increased diaphoresis, other activity, appetite or other siginficant weight change  HENT: Negative for worsening hearing loss, ear pain, facial swelling, mouth sores and neck stiffness.   Eyes: Negative for other worsening pain, redness or visual disturbance.  Respiratory: Negative for shortness of breath and wheezing.   Cardiovascular: Negative for chest pain and palpitations.  Gastrointestinal: Negative for diarrhea, blood in stool, abdominal distention or other pain Genitourinary: Negative for hematuria, flank pain or change in urine volume.  Musculoskeletal: Negative for myalgias or other joint complaints.  Skin: Negative for color change and wound.  Neurological: Negative for syncope and numbness. other than noted Hematological: Negative for adenopathy. or other swelling Psychiatric/Behavioral: Negative for hallucinations, self-injury, decreased concentration or other worsening agitation.      Objective:   Physical Exam BP 132/82  Pulse 85  Temp(Src) 98.3 F (36.8 C) (Oral) VS noted, frail  appearing, in wheelchair Constitutional: Pt appears well-developed, well-nourished.  HENT: Head: NCAT.  Right Ear: External ear normal.  Left Ear: External ear normal.  Eyes: . Pupils are equal, round, and reactive to light. Conjunctivae and EOM are normal Neck: Normal range of motion. Neck supple.  Cardiovascular: Normal rate and irregular rhythm.   Pulmonary/Chest: Effort normal and breath sounds normal.  Abd:  Soft, NT, ND, + BS Neurological: Pt is alert., motor  - moves all 4's Skin: Skin is warm. No rash, no LE edema Psychiatric: Pt behavior is normal. No agitation.     Assessment & Plan:

## 2014-03-10 NOTE — Patient Instructions (Addendum)
You had the new Prevnar pneumonia shot today  Your oxygen saturation was low off the oxygen today - 86% on room air (sitting)  OK to decrease the lasix 60 mg to once in the AM only  Please watch for any worsening swelling, as you may need to change back to twice per day lasix  Please continue all other medications as before, and refills have been done if requested.  Please have the pharmacy call with any other refills you may need.  Please continue your efforts at being more active, low cholesterol diet, and weight control.  You are otherwise up to date with prevention measures today.  Please go to the LAB in the Basement (turn left off the elevator) for the tests to be done today  You will be contacted by phone if any changes need to be made immediately.  Otherwise, you will receive a letter about your results with an explanation, but please check with MyChart first.  Please remember to sign up for MyChart if you have not done so, as this will be important to you in the future with finding out test results, communicating by private email, and scheduling acute appointments online when needed.  Please return in 3 months, or sooner if needed

## 2014-03-10 NOTE — Assessment & Plan Note (Signed)
oxygen saturation was low off the oxygen today - 86% on room air (sitting), to cont home o2 4L [place]

## 2014-03-10 NOTE — Progress Notes (Signed)
Pre visit review using our clinic review tool, if applicable. No additional management support is needed unless otherwise documented below in the visit note. 

## 2014-03-12 NOTE — Assessment & Plan Note (Signed)
stable overall by history and exam, recent data reviewed with pt, and pt to continue medical treatment as before,  to f/u any worsening symptoms or concerns BP Readings from Last 3 Encounters:  03/10/14 132/82  05/11/13 130/98  04/20/13 153/70

## 2014-03-12 NOTE — Assessment & Plan Note (Addendum)
stable overall by history and exam,  and pt to continue medical treatment as before,  to f/u any worsening symptoms or concerns  Note:  Total time for pt hx, exam, review of record with pt in the room, determination of diagnoses and plan for further eval and tx is > 40 min, with over 50% spent in coordination and counseling of patient

## 2014-03-12 NOTE — Assessment & Plan Note (Signed)
stable overall by history and exam, recent data reviewed with pt, and pt to continue medical treatment as before,  to f/u any worsening symptoms or concerns Lab Results  Component Value Date   HGBA1C 6.6* 05/11/2013

## 2014-04-07 ENCOUNTER — Other Ambulatory Visit: Payer: Self-pay | Admitting: Internal Medicine

## 2014-04-21 ENCOUNTER — Telehealth: Payer: Self-pay | Admitting: *Deleted

## 2014-04-21 NOTE — Telephone Encounter (Signed)
Left msg on triage stating went out to see pt son told her she is starting to have sore popping up on her skin they come & goes. Pt is completely bed bound. Wanting to see if pt would benefit from [employer] but would need order fax to her....Johny Chess   [name] back inform her md is out of office today & Monday will give her a call bck on Tuesday with his response...Johny Chess

## 2014-04-25 NOTE — Telephone Encounter (Signed)
[place] call stated the order need to be fax to [facility]. Fax # [phone].

## 2014-04-25 NOTE — Telephone Encounter (Signed)
Done hardcopy to robin  

## 2014-04-25 NOTE — Telephone Encounter (Signed)
Faxed script to [facility].../lmb 

## 2014-04-25 NOTE — Telephone Encounter (Signed)
[name], RN no answer LMOM RTC...[name]

## 2014-04-26 ENCOUNTER — Telehealth: Payer: Self-pay | Admitting: *Deleted

## 2014-04-26 DIAGNOSIS — J438 Other emphysema: Secondary | ICD-10-CM

## 2014-04-26 DIAGNOSIS — M79671 Pain in right foot: Secondary | ICD-10-CM

## 2014-04-26 DIAGNOSIS — I509 Heart failure, unspecified: Secondary | ICD-10-CM

## 2014-04-26 DIAGNOSIS — M79672 Pain in left foot: Principal | ICD-10-CM

## 2014-04-26 NOTE — Telephone Encounter (Signed)
I would not know these things, Not sure how to further address. Maybe needs Home Health nurse assessment?

## 2014-04-26 NOTE — Telephone Encounter (Signed)
Referral done

## 2014-04-26 NOTE — Telephone Encounter (Signed)
Notified pt son with md response.../lmb 

## 2014-04-26 NOTE — Telephone Encounter (Signed)
Left msg on triage needing to get a referral for mom to see a podiatrist so she can get her toe nails cut...[name]

## 2014-04-26 NOTE — Telephone Encounter (Signed)
Left msg on triage stating received script for the air mattress, but need more specific as well if the pt is needing a gel over lay, or alternating pressure pad pump. Will also need last 6 months ov notes to determine if medicare will cover...Johny Chess

## 2014-04-26 NOTE — Telephone Encounter (Signed)
Called Sharyn Lull back who called about getting the air mattress to see if she can help address. LMOM RTC,,,/lmb

## 2014-04-27 NOTE — Telephone Encounter (Signed)
[place] for [facility] and also [facility] referrals - I have done both

## 2014-04-27 NOTE — Telephone Encounter (Signed)
[name] called back she stated Home Health Nurse assessment would be a great start, that way [facility] can get everything approved through medicare/...lmb

## 2014-05-08 ENCOUNTER — Other Ambulatory Visit: Payer: Self-pay | Admitting: Internal Medicine

## 2014-05-08 ENCOUNTER — Telehealth: Payer: Self-pay | Admitting: *Deleted

## 2014-05-08 NOTE — Telephone Encounter (Signed)
Faxed A1c to [facility]...[name]

## 2014-05-08 NOTE — Telephone Encounter (Signed)
Left msg on triage stating wanting to inform md pt did not qualify for air mattress. Also need her A1c results fax to Att: Gertie Fey [phone]...[name]

## 2014-05-08 NOTE — Telephone Encounter (Signed)
[place] for [facility] to fax most recent a1c please

## 2014-05-11 ENCOUNTER — Ambulatory Visit: Payer: Self-pay | Admitting: Podiatry

## 2014-05-12 ENCOUNTER — Ambulatory Visit: Payer: Self-pay | Admitting: Podiatrist

## 2014-05-15 ENCOUNTER — Telehealth: Payer: Self-pay | Admitting: Internal Medicine

## 2014-05-15 NOTE — Telephone Encounter (Signed)
[facility] w/[facility] called to let us know that patient declined their services at this time.

## 2014-05-22 ENCOUNTER — Encounter: Payer: Self-pay | Admitting: Internal Medicine

## 2014-05-24 DIAGNOSIS — E119 Type 2 diabetes mellitus without complications: Secondary | ICD-10-CM

## 2014-05-24 DIAGNOSIS — I129 Hypertensive chronic kidney disease with stage 1 through stage 4 chronic kidney disease, or unspecified chronic kidney disease: Secondary | ICD-10-CM

## 2014-05-24 DIAGNOSIS — L89309 Pressure ulcer of unspecified buttock, unspecified stage: Secondary | ICD-10-CM

## 2014-05-24 DIAGNOSIS — L8992 Pressure ulcer of unspecified site, stage 2: Secondary | ICD-10-CM

## 2014-05-30 ENCOUNTER — Telehealth: Payer: Self-pay | Admitting: *Deleted

## 2014-05-30 NOTE — Telephone Encounter (Signed)
Her med list states 20 mg pill at 3 pills in the am  Robin to verify her current dose, as well as what pt is asking - how much lasix is she referring to taking in the evening dose?

## 2014-05-30 NOTE — Telephone Encounter (Signed)
Left msg on triage stating mom is starting to retain fluid again. Her ankles are swollen wanting to know can she start back taking her fluid pill twice a day...Johny Chess

## 2014-05-31 NOTE — Telephone Encounter (Signed)
Called son Marketing executive) he stated mom is taking (3) 20 mg of lasix in the morning. She was taking 3 pills in the afternoon but it was stop. Family is requesting to go back to twice a day...Johny Chess

## 2014-05-31 NOTE — Telephone Encounter (Signed)
Ok for 20 mg - 3 tabs in the am, but also 3 tabs in the PM PRN swelling only

## 2014-06-01 MED ORDER — FUROSEMIDE 20 MG PO TABS: ORAL_TABLET | ORAL | Status: DC

## 2014-06-01 NOTE — Telephone Encounter (Signed)
Notified pt son (carlyle) with md response. Son states she is needing a refill on med. Inform will send updated [facility]...Johny Chess

## 2014-06-09 ENCOUNTER — Encounter: Payer: Self-pay | Admitting: Podiatrist

## 2014-06-09 ENCOUNTER — Ambulatory Visit (INDEPENDENT_AMBULATORY_CARE_PROVIDER_SITE_OTHER): Payer: Medicare Other | Admitting: Podiatrist

## 2014-06-09 ENCOUNTER — Telehealth: Payer: Self-pay | Admitting: *Deleted

## 2014-06-09 VITALS — BP 150/78 | HR 76 | Resp 16 | Ht 66.0 in | Wt 220.0 lb

## 2014-06-09 DIAGNOSIS — I739 Peripheral vascular disease, unspecified: Secondary | ICD-10-CM

## 2014-06-09 DIAGNOSIS — B351 Tinea unguium: Secondary | ICD-10-CM

## 2014-06-09 DIAGNOSIS — R0989 Other specified symptoms and signs involving the circulatory and respiratory systems: Secondary | ICD-10-CM

## 2014-06-09 MED ORDER — SILVER SULFADIAZINE 1 % EX CREA: 1.0000 "application " | TOPICAL_CREAM | Freq: Every day | CUTANEOUS | Status: DC

## 2014-06-09 NOTE — Telephone Encounter (Signed)
Message copied by Lolita Rieger on Fri Jun 09, 2014  4:13 PM ------      Message from: Bronson Ing      Created: Fri Jun 09, 2014  2:44 PM      Regarding: vascular test       Can you please set up a arterial study for this patient-- she has open sores on her feet and non palpable pulses.  Cool temp.Marland Kitchenjust refer where you can get her in the quickest            Thanks! ------

## 2014-06-09 NOTE — Patient Instructions (Signed)
We are scheduling you a test to see how your circulation is doing to your legs.  Our office will make the referral and you will be contacted regarding the date and time of the test. If you have not heard anything by Tuesday, please let our office know.  Diabetes and Foot Care Diabetes may cause you to have problems because of poor blood supply (circulation) to your feet and legs. This may cause the skin on your feet to become thinner, break easier, and heal more slowly. Your skin may become dry, and the skin may peel and crack. You may also have nerve damage in your legs and feet causing decreased feeling in them. You may not notice minor injuries to your feet that could lead to infections or more serious problems. Taking care of your feet is one of the most important things you can do for yourself.  HOME CARE INSTRUCTIONS  Wear shoes at all times, even in the house. Do not go barefoot. Bare feet are easily injured.  Check your feet daily for blisters, cuts, and redness. If you cannot see the bottom of your feet, use a mirror or ask someone for help.  Wash your feet with warm water (do not use hot water) and mild soap. Then pat your feet and the areas between your toes until they are completely dry. Do not soak your feet as this can dry your skin.  Apply a moisturizing lotion or petroleum jelly (that does not contain alcohol and is unscented) to the skin on your feet and to dry, brittle toenails. Do not apply lotion between your toes.  Trim your toenails straight across. Do not dig under them or around the cuticle. File the edges of your nails with an emery board or nail file.  Do not cut corns or calluses or try to remove them with medicine.  Wear clean socks or stockings every day. Make sure they are not too tight. Do not wear knee-high stockings since they may decrease blood flow to your legs.  Wear shoes that fit properly and have enough cushioning. To break in new shoes, wear them for just a  few hours a day. This prevents you from injuring your feet. Always look in your shoes before you put them on to be sure there are no objects inside.  Do not cross your legs. This may decrease the blood flow to your feet.  If you find a minor scrape, cut, or break in the skin on your feet, keep it and the skin around it clean and dry. These areas may be cleansed with mild soap and water. Do not cleanse the area with peroxide, alcohol, or iodine.  When you remove an adhesive bandage, be sure not to damage the skin around it.  If you have a wound, look at it several times a day to make sure it is healing.  Do not use heating pads or hot water bottles. They may burn your skin. If you have lost feeling in your feet or legs, you may not know it is happening until it is too late.  Make sure your health care provider performs a complete foot exam at least annually or more often if you have foot problems. Report any cuts, sores, or bruises to your health care provider immediately. SEEK MEDICAL CARE IF:   You have an injury that is not healing.  You have cuts or breaks in the skin.  You have an ingrown nail.  You notice redness on  your legs or feet.  You feel burning or tingling in your legs or feet.  You have pain or cramps in your legs and feet.  Your legs or feet are numb.  Your feet always feel cold. SEEK IMMEDIATE MEDICAL CARE IF:   There is increasing redness, swelling, or pain in or around a wound.  There is a red line that goes up your leg.  Pus is coming from a wound.  You develop a fever or as directed by your health care provider.  You notice a bad smell coming from an ulcer or wound. Document Released: 09/05/2000 Document Revised: 05/11/2013 Document Reviewed: 02/15/2013 [facility] Patient Information 2015 [place], [place]. This information is not intended to replace advice given to you by your health care provider. Make sure you discuss any questions you have with your  health care provider.

## 2014-06-09 NOTE — Progress Notes (Signed)
   Subjective:    Patient ID: [name], female    DOB: [birth year], 80 y.o.   MRN: [id number]  HPI Comments: Pt presents with son Mikailah Morel, for diabetic foot care and exam.  Diabetes      Review of Systems  All other systems reviewed and are negative.      Objective:   Physical Exam Vascular status reveals nonpalpable pedal pulses DP or PT bilateral. Temperature is cool to touch. Neurological sensation is intact via Semmes Weinstein monofilament at 3/5 sites bilateral light touch and vibratory sensation are also intact bilateral decreased muscle strength tone and stability is noted with significant decrease in ability to dorsiflex plantarflex invert and evert bilateral feet.   she is wheelchair-bound and unable to ambulate unassisted. She has several shallow open sores on bilateral feet which are nontraumatic in nature. Patient's toenails are elongated, thickened, discolored, dystrophic and the left hallux nail has bruising behind the nailbed. no redness, no swelling, no sign of infection present. Shallow ulcerations are concerning for vascular compromise.     Assessment & Plan:  Decreased vascular status, PVD, symptomatic toenails, contusion, open sores  Plan: Recommended a vascular study to assess the vascular status of bilateral feet. Patient's toenails were also debrided to a safe length. We will call with the result of the vascular study and make recommendations for referrals based on the report. I will also see her back in 3 months for continued care and followup.

## 2014-06-09 NOTE — Telephone Encounter (Signed)
I sent in a stat order to [facility] at [facility] for a lower arterial doppler.

## 2014-06-12 ENCOUNTER — Other Ambulatory Visit: Payer: Self-pay | Admitting: Internal Medicine

## 2014-06-12 ENCOUNTER — Encounter: Payer: Self-pay | Admitting: Podiatrist

## 2014-06-14 ENCOUNTER — Telehealth: Payer: Self-pay | Admitting: Internal Medicine

## 2014-06-14 DIAGNOSIS — L8992 Pressure ulcer of unspecified site, stage 2: Secondary | ICD-10-CM

## 2014-06-14 DIAGNOSIS — E119 Type 2 diabetes mellitus without complications: Secondary | ICD-10-CM

## 2014-06-14 DIAGNOSIS — I129 Hypertensive chronic kidney disease with stage 1 through stage 4 chronic kidney disease, or unspecified chronic kidney disease: Secondary | ICD-10-CM

## 2014-06-14 DIAGNOSIS — L89309 Pressure ulcer of unspecified buttock, unspecified stage: Secondary | ICD-10-CM

## 2014-06-14 NOTE — Telephone Encounter (Signed)
[name] needs plan of care form that was faxed 05/17/14.  Please contact her regarding this.  [phone] ext [phone].

## 2014-06-14 NOTE — Telephone Encounter (Signed)
Called [facility] informed of our fax number and to refax.

## 2014-06-28 ENCOUNTER — Inpatient Hospital Stay (HOSPITAL_COMMUNITY)
Admission: RE | Admit: 2014-06-28 | Discharge: 2014-06-28 | Disposition: A | Discharge status: Home / Self Care | Payer: Medicare Other | Source: Ambulatory Visit

## 2014-06-28 ENCOUNTER — Encounter (HOSPITAL_COMMUNITY): Payer: Self-pay | Admitting: Emergency Medicine

## 2014-06-28 ENCOUNTER — Emergency Department (HOSPITAL_COMMUNITY)
Admission: EM | Admit: 2014-06-28 | Discharge: 2014-06-28 | Disposition: A | Discharge status: Home / Self Care | Payer: Medicare Other | Attending: Emergency Medicine | Admitting: Emergency Medicine

## 2014-06-28 ENCOUNTER — Emergency Department (HOSPITAL_COMMUNITY): Payer: Medicare Other

## 2014-06-28 DIAGNOSIS — M1990 Unspecified osteoarthritis, unspecified site: Secondary | ICD-10-CM | POA: Insufficient documentation

## 2014-06-28 DIAGNOSIS — Y9289 Other specified places as the place of occurrence of the external cause: Secondary | ICD-10-CM | POA: Diagnosis not present

## 2014-06-28 DIAGNOSIS — I4891 Unspecified atrial fibrillation: Secondary | ICD-10-CM | POA: Insufficient documentation

## 2014-06-28 DIAGNOSIS — I10 Essential (primary) hypertension: Secondary | ICD-10-CM | POA: Insufficient documentation

## 2014-06-28 DIAGNOSIS — Z8619 Personal history of other infectious and parasitic diseases: Secondary | ICD-10-CM | POA: Diagnosis not present

## 2014-06-28 DIAGNOSIS — S91114A Laceration without foreign body of right lesser toe(s) without damage to nail, initial encounter: Secondary | ICD-10-CM | POA: Diagnosis not present

## 2014-06-28 DIAGNOSIS — R0989 Other specified symptoms and signs involving the circulatory and respiratory systems: Secondary | ICD-10-CM

## 2014-06-28 DIAGNOSIS — K5900 Constipation, unspecified: Secondary | ICD-10-CM | POA: Insufficient documentation

## 2014-06-28 DIAGNOSIS — Z7952 Long term (current) use of systemic steroids: Secondary | ICD-10-CM | POA: Diagnosis not present

## 2014-06-28 DIAGNOSIS — J449 Chronic obstructive pulmonary disease, unspecified: Secondary | ICD-10-CM | POA: Insufficient documentation

## 2014-06-28 DIAGNOSIS — E119 Type 2 diabetes mellitus without complications: Secondary | ICD-10-CM | POA: Diagnosis not present

## 2014-06-28 DIAGNOSIS — Z8673 Personal history of transient ischemic attack (TIA), and cerebral infarction without residual deficits: Secondary | ICD-10-CM | POA: Insufficient documentation

## 2014-06-28 DIAGNOSIS — Z7982 Long term (current) use of aspirin: Secondary | ICD-10-CM | POA: Diagnosis not present

## 2014-06-28 DIAGNOSIS — W050XXA Fall from non-moving wheelchair, initial encounter: Secondary | ICD-10-CM | POA: Insufficient documentation

## 2014-06-28 DIAGNOSIS — K219 Gastro-esophageal reflux disease without esophagitis: Secondary | ICD-10-CM | POA: Diagnosis not present

## 2014-06-28 DIAGNOSIS — S91111A Laceration without foreign body of right great toe without damage to nail, initial encounter: Secondary | ICD-10-CM | POA: Insufficient documentation

## 2014-06-28 DIAGNOSIS — I509 Heart failure, unspecified: Secondary | ICD-10-CM | POA: Insufficient documentation

## 2014-06-28 DIAGNOSIS — IMO0002 Reserved for concepts with insufficient information to code with codable children: Secondary | ICD-10-CM

## 2014-06-28 DIAGNOSIS — Y9389 Activity, other specified: Secondary | ICD-10-CM | POA: Diagnosis not present

## 2014-06-28 DIAGNOSIS — W19XXXA Unspecified fall, initial encounter: Secondary | ICD-10-CM

## 2014-06-28 DIAGNOSIS — S8991XA Unspecified injury of right lower leg, initial encounter: Secondary | ICD-10-CM | POA: Diagnosis present

## 2014-06-28 DIAGNOSIS — Z79899 Other long term (current) drug therapy: Secondary | ICD-10-CM | POA: Insufficient documentation

## 2014-06-28 MED ORDER — LIDOCAINE HCL 1 % IJ SOLN
INTRAMUSCULAR | Status: AC
  Filled 2014-06-28: qty 20

## 2014-06-28 MED ORDER — CEPHALEXIN 500 MG PO CAPS
1000.0000 mg | ORAL_CAPSULE | Freq: Once | ORAL | Status: AC
  Administered 2014-06-28: 1000 mg via ORAL
  Filled 2014-06-28: qty 2

## 2014-06-28 MED ORDER — BACITRACIN ZINC 500 UNIT/GM EX OINT
1.0000 "application " | TOPICAL_OINTMENT | Freq: Two times a day (BID) | CUTANEOUS | Status: DC
  Administered 2014-06-28: 1 via TOPICAL

## 2014-06-28 MED ORDER — LIDOCAINE HCL 1 % IJ SOLN
5.0000 mL | Freq: Once | INTRAMUSCULAR | Status: AC
  Administered 2014-06-28: 5 mL

## 2014-06-28 MED ORDER — CEPHALEXIN 500 MG PO CAPS
500.0000 mg | ORAL_CAPSULE | Freq: Four times a day (QID) | ORAL | Status: DC
Start: 2014-06-28 — End: 2014-07-07

## 2014-06-28 NOTE — ED Notes (Signed)
Laceration noted to base of R big toe. Pt's family states they first noticed it after pt fell from wheelchair this morning; they are unsure what pt cut it on. Small amount of bleeding noted at this time. Pt denies pain. Pt denies memory of fall.

## 2014-06-28 NOTE — Discharge Instructions (Signed)
Laceration Care, Adult °A laceration is a cut or lesion that goes through all layers of the skin and into the tissue just beneath the skin. °TREATMENT  °Some lacerations may not require closure. Some lacerations may not be able to be closed due to an increased risk of infection. It is important to see your caregiver as soon as possible after an injury to minimize the risk of infection and maximize the opportunity for successful closure. °If closure is appropriate, pain medicines may be given, if needed. The wound will be cleaned to help prevent infection. Your caregiver will use stitches (sutures), staples, wound glue (adhesive), or skin adhesive strips to repair the laceration. These tools bring the skin edges together to allow for faster healing and a better cosmetic outcome. However, all wounds will heal with a scar. Once the wound has healed, scarring can be minimized by covering the wound with sunscreen during the day for 1 full year. °HOME CARE INSTRUCTIONS  °For sutures or staples: °· Keep the wound clean and dry. °· If you were given a bandage (dressing), you should change it at least once a day. Also, change the dressing if it becomes wet or dirty, or as directed by your caregiver. °· Wash the wound with soap and water 2 times a day. Rinse the wound off with water to remove all soap. Pat the wound dry with a clean towel. °· After cleaning, apply a thin layer of the antibiotic ointment as recommended by your caregiver. This will help prevent infection and keep the dressing from sticking. °· You may shower as usual after the first 24 hours. Do not soak the wound in water until the sutures are removed. °· Only take over-the-counter or prescription medicines for pain, discomfort, or fever as directed by your caregiver. °· Get your sutures or staples removed as directed by your caregiver. °For skin adhesive strips: °· Keep the wound clean and dry. °· Do not get the skin adhesive strips wet. You may bathe  carefully, using caution to keep the wound dry. °· If the wound gets wet, pat it dry with a clean towel. °· Skin adhesive strips will fall off on their own. You may trim the strips as the wound heals. Do not remove skin adhesive strips that are still stuck to the wound. They will fall off in time. °For wound adhesive: °· You may briefly wet your wound in the shower or bath. Do not soak or scrub the wound. Do not swim. Avoid periods of heavy perspiration until the skin adhesive has fallen off on its own. After showering or bathing, gently pat the wound dry with a clean towel. °· Do not apply liquid medicine, cream medicine, or ointment medicine to your wound while the skin adhesive is in place. This may loosen the film before your wound is healed. °· If a dressing is placed over the wound, be careful not to apply tape directly over the skin adhesive. This may cause the adhesive to be pulled off before the wound is healed. °· Avoid prolonged exposure to sunlight or tanning lamps while the skin adhesive is in place. Exposure to ultraviolet light in the first year will darken the scar. °· The skin adhesive will usually remain in place for 5 to 10 days, then naturally fall off the skin. Do not pick at the adhesive film. °You may need a tetanus shot if: °· You cannot remember when you had your last tetanus shot. °· You have never had a tetanus   shot. °If you get a tetanus shot, your arm may swell, get red, and feel warm to the touch. This is common and not a problem. If you need a tetanus shot and you choose not to have one, there is a rare chance of getting tetanus. Sickness from tetanus can be serious. °SEEK MEDICAL CARE IF:  °· You have redness, swelling, or increasing pain in the wound. °· You see a red line that goes away from the wound. °· You have yellowish-white fluid (pus) coming from the wound. °· You have a fever. °· You notice a bad smell coming from the wound or dressing. °· Your wound breaks open before or  after sutures have been removed. °· You notice something coming out of the wound such as wood or glass. °· Your wound is on your hand or foot and you cannot move a finger or toe. °SEEK IMMEDIATE MEDICAL CARE IF:  °· Your pain is not controlled with prescribed medicine. °· You have severe swelling around the wound causing pain and numbness or a change in color in your arm, hand, leg, or foot. °· Your wound splits open and starts bleeding. °· You have worsening numbness, weakness, or loss of function of any joint around or beyond the wound. °· You develop painful lumps near the wound or on the skin anywhere on your body. °MAKE SURE YOU:  °· Understand these instructions. °· Will watch your condition. °· Will get help right away if you are not doing well or get worse. °Document Released: 09/08/2005 Document Revised: 12/01/2011 Document Reviewed: 03/04/2011 °ExitCare® Patient Information ©2015 [employer]. This information is not intended to replace advice given to you by your health care provider. Make sure you discuss any questions you have with your health care provider. ° °Fall Prevention and Home Safety °Falls cause injuries and can affect all age groups. It is possible to prevent falls.  °HOW TO PREVENT FALLS °· Wear shoes with rubber soles that do not have an opening for your toes. °· Keep the inside and outside of your house well lit. °· Use night lights throughout your home. °· Remove clutter from floors. °· Clean up floor spills. °· Remove throw rugs or fasten them to the floor with carpet tape. °· Do not place electrical cords across pathways. °· Put grab bars by your tub, shower, and toilet. Do not use towel bars as grab bars. °· Put handrails on both sides of the stairway. Fix loose handrails. °· Do not climb on stools or stepladders, if possible. °· Do not wax your floors. °· Repair uneven or unsafe sidewalks, walkways, or stairs. °· Keep items you use a lot within reach. °· Be aware of pets. °· Keep  emergency numbers next to the telephone. °· Put smoke detectors in your home and near bedrooms. °Ask your doctor what other things you can do to prevent falls. °Document Released: 07/05/2009 Document Revised: 03/09/2012 Document Reviewed: 12/09/2011 °ExitCare® Patient Information ©2015 [employer]. This information is not intended to replace advice given to you by your health care provider. Make sure you discuss any questions you have with your health care provider. ° °

## 2014-06-28 NOTE — ED Provider Notes (Signed)
CSN: [id number]     Arrival date & time 06/28/14  1948 History   First MD Initiated Contact with Patient 06/28/14 1950     Chief Complaint  Patient presents with  . Fall     (Consider location/radiation/quality/duration/timing/severity/associated sxs/prior Treatment) HPI Comments: Patient is a 80 year old female with history of GERD, hypertension, peptic ulcer disease, TIA, congestive heart failure, diabetes, COPD, benign carcinoid tumor of the stomach, atrial fibrillation who presents to the emergency department today after a fall. The fall was witnessed by her son. They were on their way to a doctor's appointment when he put her in a wheelchair. She slid down from the wheelchair landing on her buttocks. There was no loss of consciousness. Patient has not complained of pain and is acting at her baseline. She has history of dementia. This occurred around 11 AM. EMS was initially called and helped get her up from the ground. She presents to the emergency department stay because her son noticed a laceration to the base of her right toe. Her tetanus is up to date. Bleeding is controlled.  Patient is a 80 y.o. female presenting with fall. The history is provided by the patient and a relative. No language interpreter was used.  Fall    Past Medical History  Diagnosis Date  . GERD 06/03/2007  . HYPERTENSION 06/03/2007  . OSTEOARTHRITIS 06/03/2007  . PEPTIC ULCER DISEASE 06/03/2007  . TRANSIENT ISCHEMIC ATTACK, HX OF 06/03/2007  . CONGESTIVE HEART FAILURE 06/03/2007    -2d echo 0/62/69 mild diastolic dysfunction, nl LA, RV  . DIABETES MELLITUS, TYPE II 06/03/2007  . LOW BACK PAIN 06/03/2007  . COPD 06/03/2007    Dr Wende Mott  . DEPRESSION 06/03/2007  . 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 Atrial fibrillation 08/10/2007  . HEMORRHOIDS 08/08/2009  . CONSTIPATION, INTERMITTENT 02/15/2010  . DYSPNEA/SHORTNESS OF BREATH 06/09/2008  . INTERSTITIAL LUNG DISEASE 08/10/2007    -onset 12/03  with definite steroid responsive component -PFT's October 11, 2009: VC 67%, no airflow obstruction DLC0 14%  . Allergic rhinitis   . Diverticulosis 05/11/2011  . Goiter 05/11/2011  . Shingles 05/11/2011   Past Surgical History  Procedure Laterality Date  . Stomach tumors removed  08/22/1991  . Abdominal hysterectomy  10/31/1983    TAH   Family History  Problem Relation Age of Onset  . Hypertension Mother   . Hypertension Father   . Cancer Sister     Breast Cancer, Liver Cancer   History  Substance Use Topics  . Smoking status: Never Smoker   . Smokeless tobacco: Never Used  . Alcohol Use: No   OB History   Grav Para Term Preterm Abortions TAB SAB Ect Mult Living                 Review of Systems  Unable to perform ROS: Dementia      Allergies  Levofloxacin and Ultram  Home Medications   Prior to Admission medications   Medication Sig Start Date End Date Taking? Authorizing Provider  aspirin (ECOTRIN LOW STRENGTH) 81 MG EC tablet Take 81 mg by mouth daily.      Historical Provider, MD  BYSTOLIC 10 MG tablet take 1 tablet by mouth once daily 11/24/13   Biagio Borg, MD  furosemide (LASIX) 20 MG tablet 3 tabs by mouth in the AM daily and 3 tabs in the evening as need for edema 03/10/14   Biagio Borg, MD  glimepiride (AMARYL) 2 MG tablet take  1 tablet by mouth once daily BEFORE BREAKFAST 05/09/14   Biagio Borg, MD  lovastatin (MEVACOR) 40 MG tablet Take 1 tablet (40 mg total) by mouth at bedtime. 01/04/14   Biagio Borg, MD  omeprazole (PRILOSEC) 20 MG capsule take 1 capsule by mouth once daily 04/07/14   Biagio Borg, MD  potassium chloride SA (K-DUR,KLOR-CON) 20 MEQ tablet Take 60 mEq by mouth daily.    Historical Provider, MD  predniSONE (DELTASONE) 10 MG tablet take 1 tablet by mouth once daily 05/09/14   Biagio Borg, MD  silver sulfADIAZINE (SILVADENE) 1 % cream Apply 1 application topically daily. 06/09/14   Bronson Ing, DPM  SPIRIVA HANDIHALER 18 MCG inhalation  capsule inhale the contents of one capsule in the handihaler once daily 06/12/14   Biagio Borg, MD  tiotropium [facility]) 18 MCG inhalation capsule Place 18 mcg into inhaler and inhale daily.    Historical Provider, MD   BP 119/85  Pulse 88  Temp(Src) 97.4 F (36.3 C) (Oral)  Resp 18  Wt 220 lb (99.791 kg)  SpO2 100% Physical Exam  Nursing note and vitals reviewed. Constitutional: She appears well-developed and well-nourished. No distress.  HENT:  Head: Normocephalic and atraumatic.  Right Ear: External ear normal.  Left Ear: External ear normal.  Nose: Nose normal.  Mouth/Throat: Oropharynx is clear and moist.  No broken or loose teeth  Eyes: Conjunctivae and EOM are normal. Pupils are equal, round, and reactive to light.  Neck: Normal range of motion. No spinous process tenderness and no muscular tenderness present.  Cardiovascular: Normal rate, regular rhythm, normal heart sounds, intact distal pulses and normal pulses.   Pulses:      Dorsalis pedis pulses are 2+ on the right side, and 2+ on the left side.       Posterior tibial pulses are 2+ on the right side, and 2+ on the left side.  Capillary refill in all toes 3 seconds  Pulmonary/Chest: Effort normal and breath sounds normal. No stridor. No respiratory distress. She has no wheezes. She has no rales.  Abdominal: Soft. She exhibits no distension.  Musculoskeletal: Normal range of motion.  Neurological: She is alert. No sensory deficit.  oriented to person and place per patient's baseline  Skin: Skin is warm and dry. She is not diaphoretic. No erythema.  2.5 cm laceration to the base of right great toe on plantar aspect. 1.5 cm laceration to 2nd toe on plantar aspect. Sensation to toe intact.  Psychiatric: She has a normal mood and affect. Her behavior is normal.    ED Course  Procedures (including critical care time) Labs Review Labs Reviewed - No data to display  Imaging Review Dg Foot Complete Right  06/28/2014    CLINICAL DATA:  Initial encounter for right foot laceration at the base of the right great toe after fall from wheelchair.  EXAM: RIGHT FOOT COMPLETE - 3+ VIEW  COMPARISON:  None.  FINDINGS: Mild osteopenia is present. Diabetic vascular calcifications are noted. A laceration lateral to the fifth MCP joint is noted. No acute osseous abnormality is present. Degenerative changes are noted.  IMPRESSION: 1. Soft tissue laceration. 2. No acute osseous abnormality. 3. Diabetic vascular calcifications.   Electronically Signed   By: Lawrence Santiago M.D.   On: 06/28/2014 21:31     EKG Interpretation None      LACERATION REPAIR Performed by: Cleatrice Burke Authorized by: Cleatrice Burke Consent: Verbal consent obtained. Risks and benefits: risks,  benefits and alternatives were discussed Consent given by: patient Patient identity confirmed: provided demographic data Prepped and Draped in normal sterile fashion Wound explored  Laceration Location: right first toe  Laceration Length: 2.5 cm  No Foreign Bodies seen or palpated  Anesthesia: local infiltration  Local anesthetic: lidocaine 1%  Anesthetic total: 4 ml  Irrigation method: syringe Amount of cleaning: standard  Skin closure: 4-0 Ethilon  Number of sutures: 5  Technique: vertical mattress  Patient tolerance: Patient tolerated the procedure well with no immediate complications.  LACERATION REPAIR Performed by: Cleatrice Burke Authorized by: Cleatrice Burke Consent: Verbal consent obtained. Risks and benefits: risks, benefits and alternatives were discussed Consent given by: patient Patient identity confirmed: provided demographic data Prepped and Draped in normal sterile fashion Wound explored  Laceration Location: second right toe  Laceration Length: 1.5 cm  No Foreign Bodies seen or palpated  Anesthesia: local infiltration  Local anesthetic: lidocaine 1%   Anesthetic total: 2 ml  Irrigation method:  syringe Amount of cleaning: standard  Skin closure: 4-0 Ethilon  Number of sutures: 2  Technique: vertical mattress  Patient tolerance: Patient tolerated the procedure well with no immediate complications.    MDM   Final diagnoses:  Fall, initial encounter  Laceration    Patient with witnessed fall and laceration to toes around 11am. Minor mechanism of patient sliding off wheelchair. Patient at baseline. No further imaging or workup indicated.Tdap booster UTD. Wound cleaning complete with pressure irrigation, bottom of wound visualized, no foreign bodies appreciated. Laceration occurred approximately 9 hours prior to repair which was well tolerated. Discussed with patient and family this gives patient higher risk of infection. Patient with co morbidities to affect healing. Discussed specific reasons to return to ED immediately. Will give Keflex. Discussed suture home care w pt and answered questions. Pt to f-u for wound check and suture removal in 10 days. Pt is hemodynamically stable w no complaints prior to dc.       Elwyn Lade, PA-C 06/30/14 0030

## 2014-06-28 NOTE — ED Notes (Signed)
Pt to xray

## 2014-06-28 NOTE — ED Notes (Signed)
Per ems family was sitting in the wheelchair and fell  EMS came out but pt was not transferred at that time  This afternoon family noticed pt had cut her right foot at the base of her right great toe  Bleeding noted  Dressing applied and pt transferred for evaluation  Denies neck or back pain from the fall

## 2014-06-29 ENCOUNTER — Telehealth: Payer: Self-pay | Admitting: Internal Medicine

## 2014-06-29 MED ORDER — ACETAMINOPHEN-CODEINE 300-30 MG PO TABS
1.0000 | ORAL_TABLET | Freq: Four times a day (QID) | ORAL | Status: DC | PRN
Start: 2014-06-29 — End: 2014-07-07

## 2014-06-29 MED ORDER — TRAMADOL HCL 50 MG PO TABS
50.0000 mg | ORAL_TABLET | Freq: Three times a day (TID) | ORAL | Status: DC | PRN
Start: 2014-06-29 — End: 2014-06-29

## 2014-06-29 NOTE — Telephone Encounter (Signed)
Done hardcopy to robin- tylenol #3

## 2014-06-29 NOTE — Telephone Encounter (Signed)
Daughter called to tell you her mother fell and hurt her toe.  They took her to the [facility] and they stitched it up and gave her an antibiotic but didn't give her anything for the pain. She wants to know if Dr. Jenny Reichmann will prescribe her something for her pain.

## 2014-06-29 NOTE — Telephone Encounter (Signed)
Patients family informed and faxed hardcopy for Tylenol codeine #3 to [facility]

## 2014-07-01 NOTE — ED Provider Notes (Signed)
Medical screening examination/treatment/procedure(s) were conducted as a shared visit with non-physician practitioner(s) and myself.  I personally evaluated the patient during the encounter.   EKG Interpretation None     Dg Foot Complete Right  06/28/2014   CLINICAL DATA:  Initial encounter for right foot laceration at the base of the right great toe after fall from wheelchair.  EXAM: RIGHT FOOT COMPLETE - 3+ VIEW  COMPARISON:  None.  FINDINGS: Mild osteopenia is present. Diabetic vascular calcifications are noted. A laceration lateral to the fifth MCP joint is noted. No acute osseous abnormality is present. Degenerative changes are noted.  IMPRESSION: 1. Soft tissue laceration. 2. No acute osseous abnormality. 3. Diabetic vascular calcifications.   Electronically Signed   By: Lawrence Santiago M.D.   On: 06/28/2014 21:31    Laceration repaired. Infection warnings given. No other injury. Dc home with pcp follow up. Well appearing. nontoxic  Hoy Morn, MD 07/01/14 [phone]

## 2014-07-03 ENCOUNTER — Telehealth: Payer: Self-pay | Admitting: Internal Medicine

## 2014-07-03 NOTE — Telephone Encounter (Signed)
Patient Information:  Caller Name: [name]  Phone: [phone]  Patient: [name]  Gender: Female  DOB: Aug 21, 1934  Age: 80 Years  PCP: Cathlean Cower (Adults only)  Office Follow Up:  Does the office need to follow up with this patient?: Yes  Instructions For The Office: Please review with Dr Jenny Reichmann and call daughter with cough Rx recommendations, call Rx to pharmacy if needed. Will need to make appointment for patient in  office for Friday 10/16 for suture removal.   Symptoms  Reason For Call & Symptoms: Started cough yesterday 10/11, thinks she has new cold symptoms.  C/O nausea, sometimes coughing until gabbing or vomiting mucus.  Coughing spasms about every 2-4 hours. Patient bed-ridden and not able to walk.  Fell on Wednesday 10/7 and seen in [facility] for laceration foot and infection,  Needs to make appointment in office  for 10 days to have sutures out.  Not eating well but is taking fluids.  Reviewed Health History In EMR: Yes  Reviewed Medications In EMR: Yes  Reviewed Allergies In EMR: Yes  Reviewed Surgeries / Procedures: Yes  Date of Onset of Symptoms: 07/02/2014  Guideline(s) Used:  Cough  Disposition Per Guideline:   See Today in Office  Reason For Disposition Reached:   Known COPD or other severe lung disease (i.e., bronchiectasis, cystic fibrosis, lung surgery) and worsening symptoms (i.e., increased sputum purulence or amount, increased breathing difficulty)  Advice Given:  Coughing Spasms:  Drink warm fluids. Inhale warm mist (Reason: both relax the airway and loosen up the phlegm).  Suck on cough drops or hard candy to coat the irritated throat.  Patient Refused Recommendation:  Patient Requests Prescription  Wanting Rx called in or MD recommendations for cough med OTC.  If prescription - uses [facility].

## 2014-07-04 ENCOUNTER — Emergency Department (HOSPITAL_COMMUNITY): Payer: Medicare Other

## 2014-07-04 ENCOUNTER — Encounter (HOSPITAL_COMMUNITY): Payer: Self-pay | Admitting: Emergency Medicine

## 2014-07-04 ENCOUNTER — Inpatient Hospital Stay (HOSPITAL_COMMUNITY)
Admission: EM | Admit: 2014-07-04 | Discharge: 2014-07-07 | DRG: 064 | Disposition: A | Discharge status: Skilled Nursing Facility | Payer: Medicare Other | Attending: Internal Medicine | Admitting: Internal Medicine

## 2014-07-04 DIAGNOSIS — I480 Paroxysmal atrial fibrillation: Secondary | ICD-10-CM

## 2014-07-04 DIAGNOSIS — E11649 Type 2 diabetes mellitus with hypoglycemia without coma: Secondary | ICD-10-CM | POA: Diagnosis not present

## 2014-07-04 DIAGNOSIS — J849 Interstitial pulmonary disease, unspecified: Secondary | ICD-10-CM | POA: Diagnosis present

## 2014-07-04 DIAGNOSIS — E669 Obesity, unspecified: Secondary | ICD-10-CM | POA: Diagnosis present

## 2014-07-04 DIAGNOSIS — Z7982 Long term (current) use of aspirin: Secondary | ICD-10-CM | POA: Diagnosis not present

## 2014-07-04 DIAGNOSIS — H538 Other visual disturbances: Secondary | ICD-10-CM

## 2014-07-04 DIAGNOSIS — I69351 Hemiplegia and hemiparesis following cerebral infarction affecting right dominant side: Secondary | ICD-10-CM | POA: Diagnosis not present

## 2014-07-04 DIAGNOSIS — I482 Chronic atrial fibrillation, unspecified: Secondary | ICD-10-CM

## 2014-07-04 DIAGNOSIS — Z6832 Body mass index (BMI) 32.0-32.9, adult: Secondary | ICD-10-CM | POA: Diagnosis not present

## 2014-07-04 DIAGNOSIS — E785 Hyperlipidemia, unspecified: Secondary | ICD-10-CM | POA: Diagnosis present

## 2014-07-04 DIAGNOSIS — D631 Anemia in chronic kidney disease: Secondary | ICD-10-CM | POA: Diagnosis not present

## 2014-07-04 DIAGNOSIS — J449 Chronic obstructive pulmonary disease, unspecified: Secondary | ICD-10-CM | POA: Diagnosis not present

## 2014-07-04 DIAGNOSIS — I63431 Cerebral infarction due to embolism of right posterior cerebral artery: Secondary | ICD-10-CM

## 2014-07-04 DIAGNOSIS — E441 Mild protein-calorie malnutrition: Secondary | ICD-10-CM | POA: Diagnosis not present

## 2014-07-04 DIAGNOSIS — E1140 Type 2 diabetes mellitus with diabetic neuropathy, unspecified: Secondary | ICD-10-CM

## 2014-07-04 DIAGNOSIS — H5347 Heteronymous bilateral field defects: Secondary | ICD-10-CM | POA: Diagnosis present

## 2014-07-04 DIAGNOSIS — R4781 Slurred speech: Secondary | ICD-10-CM | POA: Diagnosis present

## 2014-07-04 DIAGNOSIS — N189 Chronic kidney disease, unspecified: Secondary | ICD-10-CM

## 2014-07-04 DIAGNOSIS — I4891 Unspecified atrial fibrillation: Secondary | ICD-10-CM | POA: Diagnosis present

## 2014-07-04 DIAGNOSIS — I63331 Cerebral infarction due to thrombosis of right posterior cerebral artery: Secondary | ICD-10-CM

## 2014-07-04 DIAGNOSIS — J8410 Pulmonary fibrosis, unspecified: Secondary | ICD-10-CM | POA: Diagnosis present

## 2014-07-04 DIAGNOSIS — I129 Hypertensive chronic kidney disease with stage 1 through stage 4 chronic kidney disease, or unspecified chronic kidney disease: Secondary | ICD-10-CM | POA: Diagnosis present

## 2014-07-04 DIAGNOSIS — I5032 Chronic diastolic (congestive) heart failure: Secondary | ICD-10-CM | POA: Diagnosis not present

## 2014-07-04 DIAGNOSIS — Z7952 Long term (current) use of systemic steroids: Secondary | ICD-10-CM

## 2014-07-04 DIAGNOSIS — G9340 Encephalopathy, unspecified: Secondary | ICD-10-CM | POA: Diagnosis not present

## 2014-07-04 DIAGNOSIS — E162 Hypoglycemia, unspecified: Secondary | ICD-10-CM

## 2014-07-04 DIAGNOSIS — I10 Essential (primary) hypertension: Secondary | ICD-10-CM | POA: Diagnosis present

## 2014-07-04 DIAGNOSIS — I63531 Cerebral infarction due to unspecified occlusion or stenosis of right posterior cerebral artery: Principal | ICD-10-CM | POA: Diagnosis present

## 2014-07-04 DIAGNOSIS — E119 Type 2 diabetes mellitus without complications: Secondary | ICD-10-CM

## 2014-07-04 DIAGNOSIS — R410 Disorientation, unspecified: Secondary | ICD-10-CM | POA: Diagnosis not present

## 2014-07-04 DIAGNOSIS — N179 Acute kidney failure, unspecified: Secondary | ICD-10-CM | POA: Diagnosis not present

## 2014-07-04 DIAGNOSIS — I639 Cerebral infarction, unspecified: Secondary | ICD-10-CM | POA: Diagnosis present

## 2014-07-04 DIAGNOSIS — N184 Chronic kidney disease, stage 4 (severe): Secondary | ICD-10-CM | POA: Diagnosis present

## 2014-07-04 LAB — COMPREHENSIVE METABOLIC PANEL
ALT: 13 U/L (ref 0–35)
ANION GAP: 14 (ref 5–15)
AST: 17 U/L (ref 0–37)
Albumin: 2.6 g/dL — ABNORMAL LOW (ref 3.5–5.2)
Alkaline Phosphatase: 65 U/L (ref 39–117)
BILIRUBIN TOTAL: 0.5 mg/dL (ref 0.3–1.2)
BUN: 50 mg/dL — AB (ref 6–23)
CHLORIDE: 105 meq/L (ref 96–112)
CO2: 24 meq/L (ref 19–32)
CREATININE: 1.78 mg/dL — AB (ref 0.50–1.10)
Calcium: 8.5 mg/dL (ref 8.4–10.5)
GFR, EST AFRICAN AMERICAN: 30 mL/min — AB (ref >90)
GFR, EST NON AFRICAN AMERICAN: 26 mL/min — AB (ref >90)
Glucose, Bld: 119 mg/dL — ABNORMAL HIGH (ref 70–99)
Potassium: 4.6 mEq/L (ref 3.7–5.3)
Sodium: 143 mEq/L (ref 137–147)
Total Protein: 6.6 g/dL (ref 6.0–8.3)

## 2014-07-04 LAB — URINALYSIS, ROUTINE W REFLEX MICROSCOPIC
Bilirubin Urine: NEGATIVE
Glucose, UA: NEGATIVE mg/dL
Hgb urine dipstick: NEGATIVE
KETONES UR: NEGATIVE mg/dL
LEUKOCYTES UA: NEGATIVE
NITRITE: NEGATIVE
PROTEIN: NEGATIVE mg/dL
Specific Gravity, Urine: 1.011 (ref 1.005–1.030)
UROBILINOGEN UA: 0.2 mg/dL (ref 0.0–1.0)
pH: 5 (ref 5.0–8.0)

## 2014-07-04 LAB — GLUCOSE, CAPILLARY: Glucose-Capillary: 105 mg/dL — ABNORMAL HIGH (ref 70–99)

## 2014-07-04 LAB — CBC WITH DIFFERENTIAL/PLATELET
BASOS ABS: 0 10*3/uL (ref 0.0–0.1)
Basophils Relative: 0 % (ref 0–1)
EOS ABS: 0.1 10*3/uL (ref 0.0–0.7)
EOS PCT: 1 % (ref 0–5)
HEMATOCRIT: 31.5 % — AB (ref 36.0–46.0)
Hemoglobin: 9.9 g/dL — ABNORMAL LOW (ref 12.0–15.0)
LYMPHS ABS: 1.3 10*3/uL (ref 0.7–4.0)
Lymphocytes Relative: 20 % (ref 12–46)
MCH: 29 pg (ref 26.0–34.0)
MCHC: 31.4 g/dL (ref 30.0–36.0)
MCV: 92.4 fL (ref 78.0–100.0)
Monocytes Absolute: 0.6 10*3/uL (ref 0.1–1.0)
Monocytes Relative: 9 % (ref 3–12)
Neutro Abs: 4.8 10*3/uL (ref 1.7–7.7)
Neutrophils Relative %: 70 % (ref 43–77)
PLATELETS: 173 10*3/uL (ref 150–400)
RBC: 3.41 MIL/uL — ABNORMAL LOW (ref 3.87–5.11)
RDW: 15.3 % (ref 11.5–15.5)
WBC: 6.8 10*3/uL (ref 4.0–10.5)

## 2014-07-04 LAB — CBG MONITORING, ED
GLUCOSE-CAPILLARY: 40 mg/dL — AB (ref 70–99)
GLUCOSE-CAPILLARY: 107 mg/dL — AB (ref 70–99)
GLUCOSE-CAPILLARY: 69 mg/dL — AB (ref 70–99)
GLUCOSE-CAPILLARY: 144 mg/dL — AB (ref 70–99)
Glucose-Capillary: 114 mg/dL — ABNORMAL HIGH (ref 70–99)

## 2014-07-04 LAB — TROPONIN I: Troponin I: <0.3 ng/mL (ref <0.30)

## 2014-07-04 LAB — PROTIME-INR
INR: 1.15 (ref 0.00–1.49)
Prothrombin Time: 14.8 seconds (ref 11.6–15.2)

## 2014-07-04 LAB — I-STAT CG4 LACTIC ACID, ED: Lactic Acid, Venous: 1.49 mmol/L (ref 0.5–2.2)

## 2014-07-04 MED ORDER — INFLUENZA VAC SPLIT QUAD 0.5 ML IM SUSY
0.5000 mL | PREFILLED_SYRINGE | INTRAMUSCULAR | Status: AC
  Administered 2014-07-05: 0.5 mL via INTRAMUSCULAR
  Filled 2014-07-04 (×2): qty 0.5

## 2014-07-04 MED ORDER — PRAVASTATIN SODIUM 40 MG PO TABS
40.0000 mg | ORAL_TABLET | Freq: Every day | ORAL | Status: DC
  Administered 2014-07-05 – 2014-07-06 (×2): 40 mg via ORAL
  Filled 2014-07-04 (×3): qty 1

## 2014-07-04 MED ORDER — DEXTROSE-NACL 5-0.45 % IV SOLN
INTRAVENOUS | Status: DC
  Administered 2014-07-04: 19:00:00 via INTRAVENOUS

## 2014-07-04 MED ORDER — HEPARIN SODIUM (PORCINE) 5000 UNIT/ML IJ SOLN
5000.0000 [IU] | Freq: Three times a day (TID) | INTRAMUSCULAR | Status: DC
  Administered 2014-07-04 – 2014-07-06 (×5): 5000 [IU] via SUBCUTANEOUS
  Filled 2014-07-04 (×7): qty 1

## 2014-07-04 MED ORDER — FUROSEMIDE 10 MG/ML IJ SOLN
20.0000 mg | Freq: Two times a day (BID) | INTRAMUSCULAR | Status: DC
  Administered 2014-07-05: 20 mg via INTRAVENOUS
  Filled 2014-07-04 (×2): qty 4

## 2014-07-04 MED ORDER — CETYLPYRIDINIUM CHLORIDE 0.05 % MT LIQD
7.0000 mL | Freq: Two times a day (BID) | OROMUCOSAL | Status: DC
  Administered 2014-07-05 – 2014-07-07 (×4): 7 mL via OROMUCOSAL

## 2014-07-04 MED ORDER — SODIUM CHLORIDE 0.9 % IV BOLUS (SEPSIS)
500.0000 mL | Freq: Once | INTRAVENOUS | Status: AC
  Administered 2014-07-04: 500 mL via INTRAVENOUS

## 2014-07-04 MED ORDER — STROKE: EARLY STAGES OF RECOVERY BOOK
Freq: Once | Status: DC
  Filled 2014-07-04: qty 1

## 2014-07-04 MED ORDER — ASPIRIN 300 MG RE SUPP
300.0000 mg | Freq: Every day | RECTAL | Status: DC
  Administered 2014-07-04: 300 mg via RECTAL
  Filled 2014-07-04: qty 1

## 2014-07-04 MED ORDER — CHLORHEXIDINE GLUCONATE 0.12 % MT SOLN
15.0000 mL | Freq: Two times a day (BID) | OROMUCOSAL | Status: DC
  Administered 2014-07-04 – 2014-07-07 (×6): 15 mL via OROMUCOSAL
  Filled 2014-07-04 (×4): qty 15

## 2014-07-04 MED ORDER — DEXTROSE 50 % IV SOLN
INTRAVENOUS | Status: AC
  Administered 2014-07-04: 50 mL
  Filled 2014-07-04: qty 50

## 2014-07-04 MED ORDER — SENNOSIDES-DOCUSATE SODIUM 8.6-50 MG PO TABS: 1.0000 | ORAL_TABLET | Freq: Every evening | ORAL | Status: DC | PRN

## 2014-07-04 MED ORDER — CEPHALEXIN 500 MG PO CAPS
500.0000 mg | ORAL_CAPSULE | Freq: Four times a day (QID) | ORAL | Status: DC
  Administered 2014-07-05 – 2014-07-06 (×5): 500 mg via ORAL
  Filled 2014-07-04 (×6): qty 1

## 2014-07-04 MED ORDER — INSULIN ASPART 100 UNIT/ML ~~LOC~~ SOLN
0.0000 [IU] | Freq: Three times a day (TID) | SUBCUTANEOUS | Status: DC
  Administered 2014-07-05: 2 [IU] via SUBCUTANEOUS
  Administered 2014-07-05: 7 [IU] via SUBCUTANEOUS

## 2014-07-04 MED ORDER — PREDNISONE 5 MG PO TABS
10.0000 mg | ORAL_TABLET | Freq: Every day | ORAL | Status: DC
  Administered 2014-07-05 – 2014-07-07 (×3): 10 mg via ORAL
  Filled 2014-07-04 (×3): qty 2

## 2014-07-04 MED ORDER — PANTOPRAZOLE SODIUM 40 MG PO TBEC
40.0000 mg | DELAYED_RELEASE_TABLET | Freq: Every day | ORAL | Status: DC
  Administered 2014-07-05 – 2014-07-07 (×3): 40 mg via ORAL
  Filled 2014-07-04 (×3): qty 1

## 2014-07-04 MED ORDER — ASPIRIN 325 MG PO TABS
325.0000 mg | ORAL_TABLET | Freq: Every day | ORAL | Status: DC
  Administered 2014-07-05: 325 mg via ORAL
  Filled 2014-07-04: qty 1

## 2014-07-04 MED ORDER — TIOTROPIUM BROMIDE MONOHYDRATE 18 MCG IN CAPS
18.0000 ug | ORAL_CAPSULE | Freq: Every day | RESPIRATORY_TRACT | Status: DC
  Administered 2014-07-07: 18 ug via RESPIRATORY_TRACT
  Filled 2014-07-04 (×2): qty 5

## 2014-07-04 NOTE — ED Notes (Signed)
On triage assessment; pt follows commands; denies pain; states not hurting; alert and oriented to person/birthday; [place]; states doesn't know where she is or why she is here; multiple abrasions noted over lower legs; left elbow swollen; drainage noted with small lacerations to great toes bilaterally; minimal swelling noted lower legs; redness noted inner thighs; pt states her right arm is her bad arm and she doesn't straighten out her right arm; cbg 40 on admission--Dr Aline Brochure notified and order to give Dextrose 50 given and administered; pt remained alert and oriented during administration;

## 2014-07-04 NOTE — ED Notes (Signed)
Bed: WA20 Expected date:  Expected time:  Means of arrival:  Comments: ems- 80 yo F, lethargic x2 days, wound on right leg

## 2014-07-04 NOTE — ED Notes (Addendum)
Son at bedside. Per son pt baseline cognition; normally does not know situation of time/day/year. Pt son reports patient "slurring words and staring off in space" but seems to be back to baseline at present time. Per son patient awoke with symptoms listed above around 0830.   UTA neuro function. Pt is bedridden. Upon assessment with Aline Brochure MD pt can move feet around and has weak hand grip but unable to hold arms up for extended period and unable to lift legs at all. During exam, pt states cannot feel touch on all extremities or see to perform exam. Son reports this is pt norm.  Per son pt normally on 4 lpm [place] at home. Pt maintains unremarkable oxygen saturation on 2 lpm [place].  Pt in CT/xray at present time post assessment.

## 2014-07-04 NOTE — ED Notes (Signed)
Pt has drank 16 ounces of orange juice, had 5 bites of [place] sandwich, and a slice of cheese. Pt transferred to MRI at present time.

## 2014-07-04 NOTE — ED Notes (Signed)
Pt blood sugar was 40, did not transfer over into the system, notified nurse Tammy

## 2014-07-04 NOTE — Consult Note (Signed)
Referring Physician: Dr. Aline Brochure    Chief Complaint: Altered mental status and slurred speech.  HPI: [name] is an 80 y.o. female a history of previous strokes, TIAs, dementia, hypertension, diabetes mellitus and stage IV chronic kidney disease, brought to the emergency room because of slurred speech and increased confusion on waking up this morning. She was last seen well at 11 PM last night. Blood sugar was noted to be low and she arrived in the emergency room with a value of 40. Mental status has improved since arrival and is nearly back to baseline at this point. No focal weakness was noted by family. CT scan of her head showed no acute intracranial abnormality. Showed acute right occipital infarction. MRA showed no proximal vascular stenosis or occlusion of major cranial vessels. Moderately diseased distal right PCA was noted.  LSN: 11 PM on 07/03/2014 tPA Given: No: Resolving deficits mRankin:  Past Medical History  Diagnosis Date  . GERD 06/03/2007  . HYPERTENSION 06/03/2007  . OSTEOARTHRITIS 06/03/2007  . PEPTIC ULCER DISEASE 06/03/2007  . TRANSIENT ISCHEMIC ATTACK, HX OF 06/03/2007  . CONGESTIVE HEART FAILURE 06/03/2007    -2d echo 8/65/78 mild diastolic dysfunction, nl LA, RV  . DIABETES MELLITUS, TYPE II 06/03/2007  . LOW BACK PAIN 06/03/2007  . COPD 06/03/2007    Dr Wende Mott  . DEPRESSION 06/03/2007  . 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 Atrial fibrillation 08/10/2007  . HEMORRHOIDS 08/08/2009  . CONSTIPATION, INTERMITTENT 02/15/2010  . DYSPNEA/SHORTNESS OF BREATH 06/09/2008  . INTERSTITIAL LUNG DISEASE 08/10/2007    -onset 12/03 with definite steroid responsive component -PFT's October 11, 2009: VC 67%, no airflow obstruction DLC0 14%  . Allergic rhinitis   . Diverticulosis 05/11/2011  . Goiter 05/11/2011  . Shingles 05/11/2011    Family History  Problem Relation Age of Onset  . Hypertension Mother   . Hypertension Father   . Cancer Sister     Breast  Cancer, Liver Cancer     Medications: I have reviewed the patient's current medications.  ROS: History obtained from patient's son  General ROS: Chronically debilitated and bedridden Psychological ROS: Chronic dementia Ophthalmic ROS: negative for - blurry vision, double vision, eye pain or loss of vision ENT ROS: negative for - epistaxis, nasal discharge, oral lesions, sore throat, tinnitus or vertigo Allergy and Immunology ROS: negative for - hives or itchy/watery eyes Hematological and Lymphatic ROS: negative for - bleeding problems, bruising or swollen lymph nodes Endocrine ROS: negative for - galactorrhea, hair pattern changes, polydipsia/polyuria or temperature intolerance Respiratory ROS: negative for - cough, hemoptysis, shortness of breath or wheezing Cardiovascular ROS: negative for - chest pain, dyspnea on exertion, edema or irregular heartbeat Gastrointestinal ROS: negative for - abdominal pain, diarrhea, hematemesis, nausea/vomiting or stool incontinence Genito-Urinary ROS: Stage IV kidney disease Musculoskeletal ROS: negative for - joint swelling or muscular weakness Neurological ROS: as noted in HPI Dermatological ROS: negative for rash and skin lesion changes  Physical Examination: Blood pressure 163/75, pulse 78, temperature 97.7 F (36.5 C), temperature source Oral, resp. rate 22, SpO2 100.00%.  Neurologic Examination: Mental Status: Alert, disoriented to time and place, no acute distress.  Speech moderately slurred without evidence of aphasia. Able to follow commands without difficulty. Cranial Nerves: II-difficulty with counting fingers and no reaction to visual threat from the left side. III/IV/VI-Pupils were equal and reacted. Extraocular movements were conjugate but less than full on right left lateral gaze.    V/VII-no facial numbness and no facial weakness.  VIII-normal. X-moderate dysarthria; symmetrical palatal movement. Motor: Moderately severe diffuse  weakness all 4 extremities with slight increase in muscle tone diffusely. Sensory: Responses were unreliable. Deep Tendon Reflexes: Absent throughout. Plantars: Mute bilaterally  Dg Chest 2 View  07/04/2014   CLINICAL DATA:  80 year old female with acute cough and altered mental status. Initial encounter.  EXAM: CHEST  2 VIEW  COMPARISON:  04/17/2013 and earlier.  FINDINGS: Semi upright AP and lateral views of the chest. Stable low lung volumes. Stable cardiomegaly and mediastinal contours. Coarse perihilar and other interstitial pulmonary opacity over the series of exams. Patient arms are not raised on the lateral view. No superimposed acute pulmonary opacity identified. Stable trachea, chronic right thyroid goiter. Osteopenia and widespread osseous degenerative changes. No acute osseous abnormality identified.  IMPRESSION: Chronic lung disease as demonstrated by CT on 04/10/2011. No superimposed acute findings are identified.   Electronically Signed   By: Lars Pinks M.D.   On: 07/04/2014 11:42   Ct Head Wo Contrast  07/04/2014   EXAM: CT HEAD WITHOUT CONTRAST  TECHNIQUE: Contiguous axial images were obtained from the base of the skull through the vertex without intravenous contrast.  COMPARISON:  01/18/2004  FINDINGS: There is no evidence of mass effect, midline shift, or extra-axial fluid collections. There is no evidence of a space-occupying lesion or intracranial hemorrhage. There is no evidence of a cortical-based area of acute infarction. There is generalized cerebral atrophy. There is periventricular white matter low attenuation likely secondary to microangiopathy.  There is mild ventriculomegaly commensurate with the degree of atrophy. The basal cisterns are patent.  Visualized portions of the orbits are unremarkable. The visualized portions of the paranasal sinuses and mastoid air cells are unremarkable. Cerebrovascular atherosclerotic calcifications are noted.  The osseous structures are  unremarkable.  IMPRESSION: No acute intracranial pathology.   Electronically Signed   By: Kathreen Devoid   On: 07/04/2014 11:55    Assessment: 80 y.o. female history previous strokes and dementia presenting with acute right PCA territory infarction involving right occipital region.  Stroke Risk Factors - atrial fibrillation, diabetes mellitus, family history and hypertension  Plan: 1. HgbA1c, fasting lipid panel 2. PT consult, OT consult, Speech consult 3. Echocardiogram 4. Carotid dopplers 5. Prophylactic therapy-Antiplatelet med: Aspirin  6. Risk factor modification 7. Telemetry monitoring   C.R. Nicole Kindred, MD [facility] [phone]  07/04/2014, 2:29 PM

## 2014-07-04 NOTE — Progress Notes (Signed)
VASCULAR LAB PRELIMINARY  PRELIMINARY  PRELIMINARY  PRELIMINARY  Carotid duplex completed.    Preliminary report:  Technically difficult due to anatomy of the neck and vocal interference ( groaning) . No obvious evidence of significant ICA stenosis. Tortuosity throughout bilaterally. 1% to 39% ICA stenosis. Unable to visualize the Vertebral arteries due to anatomy of the neck.  Deeana Atwater, RVS 07/04/2014, 4:20 PM

## 2014-07-04 NOTE — H&P (Signed)
History and Physical    [name] KZL:[id number] DOB: 08-21-1934 DOA: 07/04/2014  Referring physician: Dr. Aline Brochure PCP: Cathlean Cower, MD  Specialists: Neurology   Chief Complaint: altered mental status  HPI: [name] is a 80 y.o. female has a past medical history significant for HTN, history of A fib, PUD, prior CVA with right sided weakness per son, TIAs, DM, is being brought to the ED by her son for altered mental status. He states that on waking up she had slurred speech and was staring into space without making much sense. He called EMS and on EMS arrival her CBG was normal, however in the ED was found to be low at 40. She received an amp of D50 and her CBG normalized, however her mental status did not return to baseline. Per her son, she did not reports any chest pain, shortness of breath, headaches, lightheadedness or dizziness, no abdominal pain, nausea or vomiting. In the ED patient with acute on chronic renal failure, CBGs within normal limits. Urinalysis unremarkable and a CXR was without acute findings. She had a CT scan of the head and was without acute findings as well. Neurology consulted by EDP and [place] asked for admission.   Review of Systems: unable to obtain ROS due to confusion  Past Medical History  Diagnosis Date  . GERD 06/03/2007  . HYPERTENSION 06/03/2007  . OSTEOARTHRITIS 06/03/2007  . PEPTIC ULCER DISEASE 06/03/2007  . TRANSIENT ISCHEMIC ATTACK, HX OF 06/03/2007  . CONGESTIVE HEART FAILURE 06/03/2007    -2d echo 3/90/30 mild diastolic dysfunction, nl LA, RV  . DIABETES MELLITUS, TYPE II 06/03/2007  . LOW BACK PAIN 06/03/2007  . COPD 06/03/2007    Dr Wende Mott  . DEPRESSION 06/03/2007  . 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 Atrial fibrillation 08/10/2007  . HEMORRHOIDS 08/08/2009  . CONSTIPATION, INTERMITTENT 02/15/2010  . DYSPNEA/SHORTNESS OF BREATH 06/09/2008  . INTERSTITIAL LUNG DISEASE 08/10/2007    -onset 12/03 with definite steroid responsive  component -PFT's October 11, 2009: VC 67%, no airflow obstruction DLC0 14%  . Allergic rhinitis   . Diverticulosis 05/11/2011  . Goiter 05/11/2011  . Shingles 05/11/2011   Past Surgical History  Procedure Laterality Date  . Stomach tumors removed  08/22/1991  . Abdominal hysterectomy  10/31/1983    TA[facility]   Social History:  reports that she has never smoked. She has never used smokeless tobacco. She reports that she does not drink alcohol or use illicit drugs.  Allergies  Allergen Reactions  . Levofloxacin Nausea Only  . Ultram [Tramadol Hcl] Nausea Only    Family History  Problem Relation Age of Onset  . Hypertension Mother   . Hypertension Father   . Cancer Sister     Breast Cancer, Liver Cancer   Prior to Admission medications   Medication Sig Start Date End Date Taking? Authorizing Provider  Acetaminophen-Codeine (TYLENOL/CODEINE #3) 300-30 MG per tablet Take 1 tablet by mouth every 6 (six) hours as needed for pain. 06/29/14  Yes Biagio Borg, MD  aspirin (ECOTRIN LOW STRENGTH) 81 MG EC tablet Take 81 mg by mouth every morning.    Yes Historical Provider, MD  cephALEXin (KEFLEX) 500 MG capsule Take 1 capsule (500 mg total) by mouth 4 (four) times daily. 06/28/14  Yes Elwyn Lade, PA-C  furosemide (LASIX) 20 MG tablet 3 tabs by mouth in the AM daily and 3 tabs in the evening as need for edema 03/10/14  Yes Hunt Oris  Jenny Reichmann, MD  glimepiride (AMARYL) 2 MG tablet take 1 tablet by mouth once daily BEFORE BREAKFAST 05/09/14  Yes Biagio Borg, MD  lovastatin (MEVACOR) 40 MG tablet Take 1 tablet (40 mg total) by mouth at bedtime. 01/04/14  Yes Biagio Borg, MD  nebivolol (BYSTOLIC) 10 MG tablet Take 10 mg by mouth every morning.   Yes Historical Provider, MD  omeprazole (PRILOSEC) 20 MG capsule Take 20 mg by mouth every morning.   Yes Historical Provider, MD  potassium chloride SA (K-DUR,KLOR-CON) 20 MEQ tablet Take 20 mEq by mouth 3 (three) times daily.    Yes Historical Provider, MD    predniSONE (DELTASONE) 10 MG tablet take 1 tablet by mouth once daily 05/09/14  Yes Biagio Borg, MD  silver sulfADIAZINE (SILVADENE) 1 % cream Apply 1 application topically daily. 06/09/14  Yes Bronson Ing, DPM  tiotropium (SPIRIVA) 18 MCG inhalation capsule Place 18 mcg into inhaler and inhale daily.   Yes Historical Provider, MD   Physical Exam: Filed Vitals:   07/04/14 1415 07/04/14 1430 07/04/14 1531 07/04/14 1533  BP: 163/75 154/73 162/74   Pulse: 78 75  81  Temp:      TempSrc:      Resp: 22 17  20   SpO2: 100% 100%  97%     General:  NAD, confused  Eyes: no scleral icterus  Neck: supple, no JVD  Cardiovascular: regular rate without MRG; 2+ peripheral pulses  Respiratory: clear on anterior auscultation, no wheezing  Abdomen: soft, non tender to palpation  Skin: no rashes; multiple abrasions and lacerations on her toes, no cellulitis appreciated   Musculoskeletal: no peripheral edema  Neurologic: follows commands intermittently, CN appear intact, strength 4/5 RUE, 5/5 LUE, 4/4 bilateral LE, unable to assess pronator drift as not fully following command, equivocal Babinski, appears to have left sided neglect  Labs on Admission:  Basic Metabolic Panel:  Recent Labs Lab 07/04/14 1111  NA 143  K 4.6  CL 105  CO2 24  GLUCOSE 119*  BUN 50*  CREATININE 1.78*  CALCIUM 8.5   Liver Function Tests:  Recent Labs Lab 07/04/14 1111  AST 17  ALT 13  ALKPHOS 65  BILITOT 0.5  PROT 6.6  ALBUMIN 2.6*   CBC:  Recent Labs Lab 07/04/14 1111  WBC 6.8  NEUTROABS 4.8  HGB 9.9*  HCT 31.5*  MCV 92.4  PLT 173   Cardiac Enzymes:  Recent Labs Lab 07/04/14 1128  TROPONINI <0.30   CBG:  Recent Labs Lab 07/04/14 1022 07/04/14 1114 07/04/14 1410 07/04/14 1534  GLUCAP 40* 107* 69* 114*    Radiological Exams on Admission: Dg Chest 2 View  07/04/2014   CLINICAL DATA:  80 year old female with acute cough and altered mental status. Initial encounter.   EXAM: CHEST  2 VIEW  COMPARISON:  04/17/2013 and earlier.  FINDINGS: Semi upright AP and lateral views of the chest. Stable low lung volumes. Stable cardiomegaly and mediastinal contours. Coarse perihilar and other interstitial pulmonary opacity over the series of exams. Patient arms are not raised on the lateral view. No superimposed acute pulmonary opacity identified. Stable trachea, chronic right thyroid goiter. Osteopenia and widespread osseous degenerative changes. No acute osseous abnormality identified.  IMPRESSION: Chronic lung disease as demonstrated by CT on 04/10/2011. No superimposed acute findings are identified.   Electronically Signed   By: Lars Pinks M.D.   On: 07/04/2014 11:42   Ct Head Wo Contrast  07/04/2014   EXAM: CT HEAD WITHOUT  CONTRAST  TECHNIQUE: Contiguous axial images were obtained from the base of the skull through the vertex without intravenous contrast.  COMPARISON:  01/18/2004  FINDINGS: There is no evidence of mass effect, midline shift, or extra-axial fluid collections. There is no evidence of a space-occupying lesion or intracranial hemorrhage. There is no evidence of a cortical-based area of acute infarction. There is generalized cerebral atrophy. There is periventricular white matter low attenuation likely secondary to microangiopathy.  There is mild ventriculomegaly commensurate with the degree of atrophy. The basal cisterns are patent.  Visualized portions of the orbits are unremarkable. The visualized portions of the paranasal sinuses and mastoid air cells are unremarkable. Cerebrovascular atherosclerotic calcifications are noted.  The osseous structures are unremarkable.  IMPRESSION: No acute intracranial pathology.   Electronically Signed   By: Kathreen Devoid   On: 07/04/2014 11:55   EKG: Independently reviewed. Sinus rhythm.  Assessment/Plan Active Problems:   Diabetes mellitus   Essential hypertension   COPD (chronic obstructive pulmonary disease)   INTERSTITIAL  LUNG DISEASE   Anemia of chronic kidney failure   CKD (chronic kidney disease), stage IV   Mild protein-calorie malnutrition   CVA (cerebral infarction)   CVA - patient with persistent symptoms even after glucose normalized - admit to [facility] as MRI with findings of a CVA  - 2D echo - carotid dopplers - lipid panel, A1C  DM  - place on ssi, hold home oral agents  HTN - hold home medications  CKD stage IV - baseline Cr 1.8-2, Creatinine 1.78 on admission - restart home medications in am  Chronic diastolic heart failure - stable, euvolemic, will repeat 2D echo per CVA admit pathway - Lasix converted to IV - EF 44-31%, grade I diastolic in 01/4007  COPD - Continue spiriva, no wheezing or decompensation currently  History of Atrial Fibrillation - this is appearing in patient's medical history added in 2012 by Dr. Jenny Reichmann,  - I do not see an EKG confirming this diagnosis. Monitor on telemetry.   Hypoglycemia - low rate D5 1/2 NS - careful given history of heart failure  Diet: NPO Fluids: D5 1/2 NS at 50 cc/h DVT Prophylaxis: heparin  Code Status: Full  Family Communication: d/w son bedside  Disposition Plan: admit to [facility] telemetry   Time spent: 71  Han Lysne M. Cruzita Lederer, MD [facility] Pager [phone]  If 7PM-7AM, please contact night-coverage [email or web address] Password [email or web address] 07/04/2014, 3:57 PM

## 2014-07-04 NOTE — ED Notes (Signed)
Main lab contacted and verbalizes will collect additional labs.

## 2014-07-04 NOTE — ED Notes (Signed)
Pt alert and oriented to self and place.  °

## 2014-07-04 NOTE — ED Notes (Signed)
Pt. Has went to xray and unable to obtain EKG at this moment.

## 2014-07-04 NOTE — ED Notes (Signed)
Main lab at bedside 

## 2014-07-04 NOTE — ED Notes (Signed)
Per EMS pt picked up from home; per her family pt not as aware as she normally is; staring off into space; per family pt speech is normal; not as alert as normal; pt denies any c/o distress; pt responding appropriately per EMS; pt taking antibiotics for lacerations to bilateral feet from fall last week; ekg nsr per EMS; iv started by EMS; cbg 71; bp 120/67; pr 90; uses oxygen 2l/m per [place] all the time; raspy lung sounds with non productive cough per EMS; sats 97 on 2l/

## 2014-07-04 NOTE — ED Notes (Signed)
Per MRI pt will procedure/MRI will take place in an hour. Hospitalist, EDP, son, and pt made aware.

## 2014-07-04 NOTE — ED Notes (Signed)
Pt has smear of brown loose BM as inserting suppository. Pt repositioned on right side for comfort.

## 2014-07-04 NOTE — ED Provider Notes (Signed)
CSN: [id number]     Arrival date & time 07/04/14  1001 History   First MD Initiated Contact with Patient 07/04/14 1112     Chief Complaint  Patient presents with  . Altered Mental Status  . hypoglycemic      (Consider location/radiation/quality/duration/timing/severity/associated sxs/prior Treatment) Patient is a 80 y.o. female presenting with altered mental status. The history is provided by the patient.  Altered Mental Status Presenting symptoms: confusion   Severity:  Mild Most recent episode:  Today Episode history:  Continuous Timing:  Constant Progression:  Unchanged Chronicity:  New Context comment:  Upon awakening Associated symptoms: vomiting (once a few days ago)   Associated symptoms: no abdominal pain, no fever, no headaches and no nausea     Past Medical History  Diagnosis Date  . GERD 06/03/2007  . HYPERTENSION 06/03/2007  . OSTEOARTHRITIS 06/03/2007  . PEPTIC ULCER DISEASE 06/03/2007  . TRANSIENT ISCHEMIC ATTACK, HX OF 06/03/2007  . CONGESTIVE HEART FAILURE 06/03/2007    -2d echo 2/42/35 mild diastolic dysfunction, nl LA, RV  . DIABETES MELLITUS, TYPE II 06/03/2007  . LOW BACK PAIN 06/03/2007  . COPD 06/03/2007    Dr Wende Mott  . DEPRESSION 06/03/2007  . 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 Atrial fibrillation 08/10/2007  . HEMORRHOIDS 08/08/2009  . CONSTIPATION, INTERMITTENT 02/15/2010  . DYSPNEA/SHORTNESS OF BREATH 06/09/2008  . INTERSTITIAL LUNG DISEASE 08/10/2007    -onset 12/03 with definite steroid responsive component -PFT's October 11, 2009: VC 67%, no airflow obstruction DLC0 14%  . Allergic rhinitis   . Diverticulosis 05/11/2011  . Goiter 05/11/2011  . Shingles 05/11/2011   Past Surgical History  Procedure Laterality Date  . Stomach tumors removed  08/22/1991  . Abdominal hysterectomy  10/31/1983    TAH   Family History  Problem Relation Age of Onset  . Hypertension Mother   . Hypertension Father   . Cancer Sister     Breast  Cancer, Liver Cancer   History  Substance Use Topics  . Smoking status: Never Smoker   . Smokeless tobacco: Never Used  . Alcohol Use: No   OB History   Grav Para Term Preterm Abortions TAB SAB Ect Mult Living                 Review of Systems  Constitutional: Negative for fever and fatigue.  HENT: Negative for congestion and drooling.   Eyes: Negative for pain.  Respiratory: Positive for cough. Negative for shortness of breath.   Cardiovascular: Negative for chest pain.  Gastrointestinal: Positive for vomiting (once a few days ago). Negative for nausea, abdominal pain and diarrhea.  Genitourinary: Negative for dysuria and hematuria.  Musculoskeletal: Negative for back pain, gait problem and neck pain.  Skin: Negative for color change.  Neurological: Negative for dizziness and headaches.  Hematological: Negative for adenopathy.  Psychiatric/Behavioral: Positive for confusion. Negative for behavioral problems.  All other systems reviewed and are negative.     Allergies  Levofloxacin and Ultram  Home Medications   Prior to Admission medications   Medication Sig Start Date End Date Taking? Authorizing Provider  Acetaminophen-Codeine (TYLENOL/CODEINE #3) 300-30 MG per tablet Take 1 tablet by mouth every 6 (six) hours as needed for pain. 06/29/14   Biagio Borg, MD  aspirin (ECOTRIN LOW STRENGTH) 81 MG EC tablet Take 81 mg by mouth every morning.     Historical Provider, MD  cephALEXin (KEFLEX) 500 MG capsule Take 1 capsule (500 mg total) by mouth 4 (  four) times daily. 06/28/14   Elwyn Lade, PA-C  furosemide (LASIX) 20 MG tablet 3 tabs by mouth in the AM daily and 3 tabs in the evening as need for edema 03/10/14   Biagio Borg, MD  glimepiride (AMARYL) 2 MG tablet take 1 tablet by mouth once daily BEFORE BREAKFAST 05/09/14   Biagio Borg, MD  lovastatin (MEVACOR) 40 MG tablet Take 1 tablet (40 mg total) by mouth at bedtime. 01/04/14   Biagio Borg, MD  nebivolol (BYSTOLIC) 10  MG tablet Take 10 mg by mouth every morning.    Historical Provider, MD  omeprazole (PRILOSEC) 20 MG capsule Take 20 mg by mouth every morning.    Historical Provider, MD  potassium chloride SA (K-DUR,KLOR-CON) 20 MEQ tablet Take 20 mEq by mouth 3 (three) times daily.     Historical Provider, MD  predniSONE (DELTASONE) 10 MG tablet take 1 tablet by mouth once daily 05/09/14   Biagio Borg, MD  silver sulfADIAZINE (SILVADENE) 1 % cream Apply 1 application topically daily. 06/09/14   Bronson Ing, DPM  tiotropium (SPIRIVA) 18 MCG inhalation capsule Place 18 mcg into inhaler and inhale daily.    Historical Provider, MD   BP 153/97  Pulse 79  Temp(Src) 97.9 F (36.6 C) (Oral)  Resp 26  SpO2 100% Physical Exam  Nursing note and vitals reviewed. Constitutional: She appears well-developed and well-nourished.  HENT:  Head: Normocephalic and atraumatic.  Mouth/Throat: Oropharynx is clear and moist. No oropharyngeal exudate.  Eyes: Conjunctivae and EOM are normal. Pupils are equal, round, and reactive to light.  Neck: Normal range of motion. Neck supple.  Cardiovascular: Normal rate, regular rhythm, normal heart sounds and intact distal pulses.  Exam reveals no gallop and no friction rub.   No murmur heard. Pulmonary/Chest: Effort normal and breath sounds normal. No respiratory distress. She has no wheezes.  Abdominal: Soft. Bowel sounds are normal. There is no tenderness. There is no rebound and no guarding.  Musculoskeletal: Normal range of motion. She exhibits no edema and no tenderness.  Abrasions to the dorsal aspect of bilateral toes.  Neurological: She is alert.  A/o x2, does not know year.   2/5 strength in bilateral upper extremities. 1/5 strength in bilateral lower extremities.  Skin: Skin is warm and dry.  Psychiatric: She has a normal mood and affect. Her behavior is normal.    ED Course  Procedures (including critical care time) Labs Review Labs Reviewed  CBC WITH  DIFFERENTIAL - Abnormal; Notable for the following:    RBC 3.41 (*)    Hemoglobin 9.9 (*)    HCT 31.5 (*)    All other components within normal limits  COMPREHENSIVE METABOLIC PANEL - Abnormal; Notable for the following:    Glucose, Bld 119 (*)    BUN 50 (*)    Creatinine, Ser 1.78 (*)    Albumin 2.6 (*)    GFR calc non Af Amer 26 (*)    GFR calc Af Amer 30 (*)    All other components within normal limits  GLUCOSE, CAPILLARY - Abnormal; Notable for the following:    Glucose-Capillary 105 (*)    All other components within normal limits  GLUCOSE, CAPILLARY - Abnormal; Notable for the following:    Glucose-Capillary 69 (*)    All other components within normal limits  GLUCOSE, CAPILLARY - Abnormal; Notable for the following:    Glucose-Capillary 48 (*)    All other components within normal limits  GLUCOSE, CAPILLARY - Abnormal; Notable for the following:    Glucose-Capillary 64 (*)    All other components within normal limits  CBG MONITORING, ED - Abnormal; Notable for the following:    Glucose-Capillary 107 (*)    All other components within normal limits  CBG MONITORING, ED - Abnormal; Notable for the following:    Glucose-Capillary 40 (*)    All other components within normal limits  CBG MONITORING, ED - Abnormal; Notable for the following:    Glucose-Capillary 69 (*)    All other components within normal limits  CBG MONITORING, ED - Abnormal; Notable for the following:    Glucose-Capillary 114 (*)    All other components within normal limits  CBG MONITORING, ED - Abnormal; Notable for the following:    Glucose-Capillary 144 (*)    All other components within normal limits  URINALYSIS, ROUTINE W REFLEX MICROSCOPIC  PROTIME-INR  TROPONIN I  GLUCOSE, CAPILLARY  I-STAT CG4 LACTIC ACID, ED    Imaging Review Dg Chest 2 View  07/04/2014   CLINICAL DATA:  80 year old female with acute cough and altered mental status. Initial encounter.  EXAM: CHEST  2 VIEW  COMPARISON:   04/17/2013 and earlier.  FINDINGS: Semi upright AP and lateral views of the chest. Stable low lung volumes. Stable cardiomegaly and mediastinal contours. Coarse perihilar and other interstitial pulmonary opacity over the series of exams. Patient arms are not raised on the lateral view. No superimposed acute pulmonary opacity identified. Stable trachea, chronic right thyroid goiter. Osteopenia and widespread osseous degenerative changes. No acute osseous abnormality identified.  IMPRESSION: Chronic lung disease as demonstrated by CT on 04/10/2011. No superimposed acute findings are identified.   Electronically Signed   By: Lars Pinks M.D.   On: 07/04/2014 11:42   Ct Head Wo Contrast  07/04/2014   EXAM: CT HEAD WITHOUT CONTRAST  TECHNIQUE: Contiguous axial images were obtained from the base of the skull through the vertex without intravenous contrast.  COMPARISON:  01/18/2004  FINDINGS: There is no evidence of mass effect, midline shift, or extra-axial fluid collections. There is no evidence of a space-occupying lesion or intracranial hemorrhage. There is no evidence of a cortical-based area of acute infarction. There is generalized cerebral atrophy. There is periventricular white matter low attenuation likely secondary to microangiopathy.  There is mild ventriculomegaly commensurate with the degree of atrophy. The basal cisterns are patent.  Visualized portions of the orbits are unremarkable. The visualized portions of the paranasal sinuses and mastoid air cells are unremarkable. Cerebrovascular atherosclerotic calcifications are noted.  The osseous structures are unremarkable.  IMPRESSION: No acute intracranial pathology.   Electronically Signed   By: Kathreen Devoid   On: 07/04/2014 11:55   Mr Jodene Nam Head Wo Contrast  07/04/2014   CLINICAL DATA:  Lethargy for 2 days. Slurred speech. History of hypertension, diabetes, and previous stroke.  EXAM: MRI HEAD WITHOUT CONTRAST  MRA HEAD WITHOUT CONTRAST  TECHNIQUE:  Multiplanar, multiecho pulse sequences of the brain and surrounding structures were obtained without intravenous contrast. Angiographic images of the head were obtained using MRA technique without contrast.  COMPARISON:  CT head 07/04/2014.  FINDINGS: MRI HEAD FINDINGS  There is a moderate-sized area of acute infarction affecting the RIGHT occipital lobe involving the medial cortex and subcortical white matter. Slight restricted diffusion in the adjacent corpus callosum. No contralateral, deep nuclei, or brainstem involvement.  No acute hemorrhage, mass lesion, or extra-axial fluid.  Hydrocephalus ex vacuo. Extreme cortical atrophy also affects the  brainstem and cerebellum. Scattered foci of chronic infarction affect the BILATERAL parietal lobes, BILATERAL cerebellum, and deep nuclei. Tiny foci of chronic hemorrhage are evident in the RIGHT parietal lobe, within the area of chronic infarction. Prominent perivascular spaces. Flow voids are maintained. Unremarkable pituitary and cerebellar tonsils. No worrisome osseous lesions. Negative orbits. No acute sinus or mastoid fluid.  Compared with the CT earlier today, the RIGHT PCA infarct is not clearly discernible as acute.  MRA HEAD FINDINGS  The internal carotid arteries are widely patent. The basilar artery is widely patent with both vertebrals contributing, LEFT dominant. Mild non stenotic irregularity both anterior cerebral arteries proximally. No proximal MCA stenosis. Both posterior cerebral arteries are widely patent throughout their P1 and P2 segments. The RIGHT P3 PCA segment appears moderately to severely disease. Slight irregularity distal MCA branches also noted. No intracranial aneurysm. No visible cerebellar branch occlusion.  IMPRESSION: Moderate size acute RIGHT PCA territory infarct, affecting the RIGHT occipital lobe. No evidence for hemorrhagic transformation.  Extreme atrophy with evidence for remote areas of chronic infarction.  No proximal vascular  stenosis or occlusion of the major intracranial vessels.  Moderately diseased distal posterior cerebral artery on the RIGHT.   Electronically Signed   By: Rolla Flatten M.D.   On: 07/04/2014 16:10   Mr Brain Wo Contrast  07/04/2014   CLINICAL DATA:  Lethargy for 2 days. Slurred speech. History of hypertension, diabetes, and previous stroke.  EXAM: MRI HEAD WITHOUT CONTRAST  MRA HEAD WITHOUT CONTRAST  TECHNIQUE: Multiplanar, multiecho pulse sequences of the brain and surrounding structures were obtained without intravenous contrast. Angiographic images of the head were obtained using MRA technique without contrast.  COMPARISON:  CT head 07/04/2014.  FINDINGS: MRI HEAD FINDINGS  There is a moderate-sized area of acute infarction affecting the RIGHT occipital lobe involving the medial cortex and subcortical white matter. Slight restricted diffusion in the adjacent corpus callosum. No contralateral, deep nuclei, or brainstem involvement.  No acute hemorrhage, mass lesion, or extra-axial fluid.  Hydrocephalus ex vacuo. Extreme cortical atrophy also affects the brainstem and cerebellum. Scattered foci of chronic infarction affect the BILATERAL parietal lobes, BILATERAL cerebellum, and deep nuclei. Tiny foci of chronic hemorrhage are evident in the RIGHT parietal lobe, within the area of chronic infarction. Prominent perivascular spaces. Flow voids are maintained. Unremarkable pituitary and cerebellar tonsils. No worrisome osseous lesions. Negative orbits. No acute sinus or mastoid fluid.  Compared with the CT earlier today, the RIGHT PCA infarct is not clearly discernible as acute.  MRA HEAD FINDINGS  The internal carotid arteries are widely patent. The basilar artery is widely patent with both vertebrals contributing, LEFT dominant. Mild non stenotic irregularity both anterior cerebral arteries proximally. No proximal MCA stenosis. Both posterior cerebral arteries are widely patent throughout their P1 and P2 segments.  The RIGHT P3 PCA segment appears moderately to severely disease. Slight irregularity distal MCA branches also noted. No intracranial aneurysm. No visible cerebellar branch occlusion.  IMPRESSION: Moderate size acute RIGHT PCA territory infarct, affecting the RIGHT occipital lobe. No evidence for hemorrhagic transformation.  Extreme atrophy with evidence for remote areas of chronic infarction.  No proximal vascular stenosis or occlusion of the major intracranial vessels.  Moderately diseased distal posterior cerebral artery on the RIGHT.   Electronically Signed   By: Rolla Flatten M.D.   On: 07/04/2014 16:10     EKG Interpretation   Date/Time:  Tuesday July 04 2014 11:47:03 EDT Ventricular Rate:  79 PR Interval:  160 QRS  Duration: 96 QT Interval:  408 QTC Calculation: 468 R Axis:   18 Text Interpretation:  Sinus rhythm Low voltage, precordial leads Minimal  ST elevation, inferior leads No significant change since last tracing  Confirmed by Finn Amos  MD, Claire Dolores ([id number]) on 07/05/2014 10:52:58 AM      MDM   Final diagnoses:  Slurring of speech  Confusion  CVA (cerebral infarction)    11:26 AM 80 y.o. female w hx of sob on 2L [place] at baseline, CHF, TIA, DM who presents with altered mental status. Her son reports that she has had a mild cough. He notes that this morning after awakening she was noted to have some slurring of speech and some mild confusion. She is alert and oriented x2 here. She does not know the year. This is her baseline per the son. She was seen here last week for a mild mechanical fall from her wheelchair and had some lacerations repaired on her feet. She is afebrile and vital signs are unremarkable here. She denies any pain on exam. Will get screening labs and imaging. Of note EMS reported a blood sugar of 71 but her blood sugar here was in the 40s. She was given an amp of D50. She has a poor neurologic exam.   Labs/imaging non-contrib, but suspicious for CVA given report  of slurring versus hypoglycemic episode. Discussed w/ hospitalist and neurologist. Neuro to see pt in ED, will order MRI per neuro recs.   Pt found to have CVA.     Pamella Pert, MD 07/05/14 1055

## 2014-07-05 DIAGNOSIS — E1140 Type 2 diabetes mellitus with diabetic neuropathy, unspecified: Secondary | ICD-10-CM

## 2014-07-05 DIAGNOSIS — I63431 Cerebral infarction due to embolism of right posterior cerebral artery: Secondary | ICD-10-CM

## 2014-07-05 DIAGNOSIS — I480 Paroxysmal atrial fibrillation: Secondary | ICD-10-CM

## 2014-07-05 DIAGNOSIS — E162 Hypoglycemia, unspecified: Secondary | ICD-10-CM

## 2014-07-05 DIAGNOSIS — I63531 Cerebral infarction due to unspecified occlusion or stenosis of right posterior cerebral artery: Secondary | ICD-10-CM | POA: Diagnosis not present

## 2014-07-05 DIAGNOSIS — I5032 Chronic diastolic (congestive) heart failure: Secondary | ICD-10-CM

## 2014-07-05 LAB — GLUCOSE, CAPILLARY
GLUCOSE-CAPILLARY: 69 mg/dL — AB (ref 70–99)
GLUCOSE-CAPILLARY: 307 mg/dL — AB (ref 70–99)
Glucose-Capillary: 48 mg/dL — ABNORMAL LOW (ref 70–99)
Glucose-Capillary: 64 mg/dL — ABNORMAL LOW (ref 70–99)
Glucose-Capillary: 93 mg/dL (ref 70–99)
Glucose-Capillary: 167 mg/dL — ABNORMAL HIGH (ref 70–99)
Glucose-Capillary: 205 mg/dL — ABNORMAL HIGH (ref 70–99)

## 2014-07-05 LAB — LIPID PANEL
CHOL/HDL RATIO: 2.6 ratio
Cholesterol: 126 mg/dL (ref 0–200)
HDL: 49 mg/dL (ref >39)
LDL CALC: 47 mg/dL (ref 0–99)
Triglycerides: 152 mg/dL — ABNORMAL HIGH (ref <150)
VLDL: 30 mg/dL (ref 0–40)

## 2014-07-05 NOTE — Progress Notes (Signed)
[facility] PROGRESS NOTE  AZHIA SIEFKEN GDJ:[id number] DOB: 05/19/1934 DOA: 07/04/2014 PCP: Cathlean Cower, MD  Assessment/Plan: 1. Acute right PCA territory infarct -Patient with history of CVA, presenting with encephalopathy -Patient placed on stroke protocol, continue aspirin and statin, await further recommendations from neurology. -Carotid Dopplers did not reveal significant ICA stenosis -2-D echo pending -Physical therapy/occupational therapy recommending SNF placement  2.  Type 2 diabetes mellitus -Patient on sliding scale coverage, had been on amaryl at home -She was hypoglycemic on admission with a blood sugar of 48 for which oral hypoglycemic therapy held  3.  Hypertension. -Patient's antihypertensive agents were held on admission given presentation of acute CVA and allow for permissive hypertension -Systolic blood pressures in the 140s to 150s  4.  Chronic diastolic congestive heart failure -Pending repeat transthoracic echocardiogram -Clinically compensated, continue Lasix 20 mg IV twice a day  Code Status: Full Code Family Communication: I spoke with her daughter over telephone conversation Disposition Plan: Will likely need SNF   Consultants:  Neurology  Procedures:  Carotid Dopplers negative for significant ICA stenosis   HPI/Subjective: This is a pleasant 80 year old female with a history of A. fib, hypertension, peptic ulcer disease, history of CVA, who was admitted to the medicine service on 07/04/2014. She presented with mental status changes. Findings concerning for acute CVA as MRI of brain showing moderate-sized acute right PCA territory infarct affecting the right occipital lobe. She was seen and evaluated by neurology during this hospitalization.  Objective: Filed Vitals:   07/05/14 1354  BP: 151/78  Pulse: 90  Temp: 97 F (36.1 C)  Resp: 20    Intake/Output Summary (Last 24 hours) at 07/05/14 1435 Last data filed at 07/05/14 1225  Gross  per 24 hour  Intake    460 ml  Output      0 ml  Net    460 ml   Filed Weights   07/04/14 2016  Weight: 92.126 kg (203 lb 1.6 oz)    Exam:   General:  Patient is sedated, difficult to arouse, having difficulty following commands  Cardiovascular: Regular rate rhythm normal S1-S2  Respiratory: Normal respiratory effort, clear to auscultation bilaterally  Abdomen: Obese, soft nontender nondistended  Musculoskeletal: 1+ edema to lower extremities  Neurological: Poor historian, patient having difficulties following commands, could not perform adequate neurologic exam  Data Reviewed: Basic Metabolic Panel:  Recent Labs Lab 07/04/14 1111  NA 143  K 4.6  CL 105  CO2 24  GLUCOSE 119*  BUN 50*  CREATININE 1.78*  CALCIUM 8.5   Liver Function Tests:  Recent Labs Lab 07/04/14 1111  AST 17  ALT 13  ALKPHOS 65  BILITOT 0.5  PROT 6.6  ALBUMIN 2.6*   No results found for this basename: LIPASE, AMYLASE,  in the last 168 hours No results found for this basename: AMMONIA,  in the last 168 hours CBC:  Recent Labs Lab 07/04/14 1111  WBC 6.8  NEUTROABS 4.8  HGB 9.9*  HCT 31.5*  MCV 92.4  PLT 173   Cardiac Enzymes:  Recent Labs Lab 07/04/14 1128  TROPONINI <0.30   BNP (last 3 results) No results found for this basename: PROBNP,  in the last 8760 hours CBG:  Recent Labs Lab 07/05/14 0651 07/05/14 0707 07/05/14 0718 07/05/14 0727 07/05/14 1104  GLUCAP 48* 64* 69* 93 167*    No results found for this or any previous visit (from the past 240 hour(s)).   Studies: Dg Chest 2 View  07/04/2014  CLINICAL DATA:  80 year old female with acute cough and altered mental status. Initial encounter.  EXAM: CHEST  2 VIEW  COMPARISON:  04/17/2013 and earlier.  FINDINGS: Semi upright AP and lateral views of the chest. Stable low lung volumes. Stable cardiomegaly and mediastinal contours. Coarse perihilar and other interstitial pulmonary opacity over the series of  exams. Patient arms are not raised on the lateral view. No superimposed acute pulmonary opacity identified. Stable trachea, chronic right thyroid goiter. Osteopenia and widespread osseous degenerative changes. No acute osseous abnormality identified.  IMPRESSION: Chronic lung disease as demonstrated by CT on 04/10/2011. No superimposed acute findings are identified.   Electronically Signed   By: Lars Pinks M.D.   On: 07/04/2014 11:42   Ct Head Wo Contrast  07/04/2014   EXAM: CT HEAD WITHOUT CONTRAST  TECHNIQUE: Contiguous axial images were obtained from the base of the skull through the vertex without intravenous contrast.  COMPARISON:  01/18/2004  FINDINGS: There is no evidence of mass effect, midline shift, or extra-axial fluid collections. There is no evidence of a space-occupying lesion or intracranial hemorrhage. There is no evidence of a cortical-based area of acute infarction. There is generalized cerebral atrophy. There is periventricular white matter low attenuation likely secondary to microangiopathy.  There is mild ventriculomegaly commensurate with the degree of atrophy. The basal cisterns are patent.  Visualized portions of the orbits are unremarkable. The visualized portions of the paranasal sinuses and mastoid air cells are unremarkable. Cerebrovascular atherosclerotic calcifications are noted.  The osseous structures are unremarkable.  IMPRESSION: No acute intracranial pathology.   Electronically Signed   By: Kathreen Devoid   On: 07/04/2014 11:55   Mr Jodene Nam Head Wo Contrast  07/04/2014   CLINICAL DATA:  Lethargy for 2 days. Slurred speech. History of hypertension, diabetes, and previous stroke.  EXAM: MRI HEAD WITHOUT CONTRAST  MRA HEAD WITHOUT CONTRAST  TECHNIQUE: Multiplanar, multiecho pulse sequences of the brain and surrounding structures were obtained without intravenous contrast. Angiographic images of the head were obtained using MRA technique without contrast.  COMPARISON:  CT head  07/04/2014.  FINDINGS: MRI HEAD FINDINGS  There is a moderate-sized area of acute infarction affecting the RIGHT occipital lobe involving the medial cortex and subcortical white matter. Slight restricted diffusion in the adjacent corpus callosum. No contralateral, deep nuclei, or brainstem involvement.  No acute hemorrhage, mass lesion, or extra-axial fluid.  Hydrocephalus ex vacuo. Extreme cortical atrophy also affects the brainstem and cerebellum. Scattered foci of chronic infarction affect the BILATERAL parietal lobes, BILATERAL cerebellum, and deep nuclei. Tiny foci of chronic hemorrhage are evident in the RIGHT parietal lobe, within the area of chronic infarction. Prominent perivascular spaces. Flow voids are maintained. Unremarkable pituitary and cerebellar tonsils. No worrisome osseous lesions. Negative orbits. No acute sinus or mastoid fluid.  Compared with the CT earlier today, the RIGHT PCA infarct is not clearly discernible as acute.  MRA HEAD FINDINGS  The internal carotid arteries are widely patent. The basilar artery is widely patent with both vertebrals contributing, LEFT dominant. Mild non stenotic irregularity both anterior cerebral arteries proximally. No proximal MCA stenosis. Both posterior cerebral arteries are widely patent throughout their P1 and P2 segments. The RIGHT P3 PCA segment appears moderately to severely disease. Slight irregularity distal MCA branches also noted. No intracranial aneurysm. No visible cerebellar branch occlusion.  IMPRESSION: Moderate size acute RIGHT PCA territory infarct, affecting the RIGHT occipital lobe. No evidence for hemorrhagic transformation.  Extreme atrophy with evidence for remote areas of chronic  infarction.  No proximal vascular stenosis or occlusion of the major intracranial vessels.  Moderately diseased distal posterior cerebral artery on the RIGHT.   Electronically Signed   By: Rolla Flatten M.D.   On: 07/04/2014 16:10   Mr Brain Wo  Contrast  07/04/2014   CLINICAL DATA:  Lethargy for 2 days. Slurred speech. History of hypertension, diabetes, and previous stroke.  EXAM: MRI HEAD WITHOUT CONTRAST  MRA HEAD WITHOUT CONTRAST  TECHNIQUE: Multiplanar, multiecho pulse sequences of the brain and surrounding structures were obtained without intravenous contrast. Angiographic images of the head were obtained using MRA technique without contrast.  COMPARISON:  CT head 07/04/2014.  FINDINGS: MRI HEAD FINDINGS  There is a moderate-sized area of acute infarction affecting the RIGHT occipital lobe involving the medial cortex and subcortical white matter. Slight restricted diffusion in the adjacent corpus callosum. No contralateral, deep nuclei, or brainstem involvement.  No acute hemorrhage, mass lesion, or extra-axial fluid.  Hydrocephalus ex vacuo. Extreme cortical atrophy also affects the brainstem and cerebellum. Scattered foci of chronic infarction affect the BILATERAL parietal lobes, BILATERAL cerebellum, and deep nuclei. Tiny foci of chronic hemorrhage are evident in the RIGHT parietal lobe, within the area of chronic infarction. Prominent perivascular spaces. Flow voids are maintained. Unremarkable pituitary and cerebellar tonsils. No worrisome osseous lesions. Negative orbits. No acute sinus or mastoid fluid.  Compared with the CT earlier today, the RIGHT PCA infarct is not clearly discernible as acute.  MRA HEAD FINDINGS  The internal carotid arteries are widely patent. The basilar artery is widely patent with both vertebrals contributing, LEFT dominant. Mild non stenotic irregularity both anterior cerebral arteries proximally. No proximal MCA stenosis. Both posterior cerebral arteries are widely patent throughout their P1 and P2 segments. The RIGHT P3 PCA segment appears moderately to severely disease. Slight irregularity distal MCA branches also noted. No intracranial aneurysm. No visible cerebellar branch occlusion.  IMPRESSION: Moderate size  acute RIGHT PCA territory infarct, affecting the RIGHT occipital lobe. No evidence for hemorrhagic transformation.  Extreme atrophy with evidence for remote areas of chronic infarction.  No proximal vascular stenosis or occlusion of the major intracranial vessels.  Moderately diseased distal posterior cerebral artery on the RIGHT.   Electronically Signed   By: Rolla Flatten M.D.   On: 07/04/2014 16:10    Scheduled Meds: .  stroke: mapping our early stages of recovery book   Does not apply Once  . antiseptic oral rinse  7 mL Mouth Rinse q12n4p  . aspirin  300 mg Rectal Daily   Or  . aspirin  325 mg Oral Daily  . cephALEXin  500 mg Oral QID  . chlorhexidine  15 mL Mouth Rinse BID  . heparin  5,000 Units Subcutaneous 3 times per day  . Influenza vac split quadrivalent PF  0.5 mL Intramuscular Tomorrow-1000  . insulin aspart  0-9 Units Subcutaneous TID WC  . pantoprazole  40 mg Oral Daily  . pravastatin  40 mg Oral q1800  . predniSONE  10 mg Oral Q breakfast  . tiotropium  18 mcg Inhalation Daily   Continuous Infusions: . dextrose 5 % and 0.45% NaCl 50 mL/hr at 07/04/14 1851    Active Problems:   Diabetes mellitus   Essential hypertension   COPD (chronic obstructive pulmonary disease)   INTERSTITIAL LUNG DISEASE   Anemia of chronic kidney failure   CKD (chronic kidney disease), stage IV   Mild protein-calorie malnutrition   CVA (cerebral infarction)    Time spent: 65  min    Kelvin Cellar  [facility] Pager [phone]. If 7PM-7AM, please contact night-coverage at [email or web address], password [facility] 07/05/2014, 2:35 PM  LOS: 1 day

## 2014-07-05 NOTE — Evaluation (Signed)
Physical Therapy Evaluation Patient Details Name: [name] MRN: [id number] DOB: [birth year] Today's Date: 07/05/2014   History of Present Illness  80 y.o. female admitted to [facility] from [facility] ED due to [place].  Stroke workup pending as medical workup was negative (urine cultures and chest x-ray).  CT negative, but MRI revealed Moderate size acute RIGHT PCA territory infarct, affecting the RIGHT occipital lobe.  Pt with significant PMHx of HTN, OA, TIA, DM, low back pain, COPD, A-fib, interstitial lung disease, and stomach tumors s/p removal.   Clinical Impression  Pt presents with significant bil upper and bil lower extremity deficits.  Based on her physical presentation, I would say she is primarily bed bound at home.  She has multiple wounds on bil legs and buttocks and would currently benefit from an air mattress overlay and blue PRAFO boots to protect her feet from forming more wounds while she is here (RN made aware).  She is appropriate for SNF placement if family desires.  PT to defer all further therapy to SNF as pt seems to be bed bound at baseline.      Follow Up Recommendations SNF    Equipment Recommendations  Wheelchair (measurements PT);Wheelchair cushion (measurements PT);Hospital bed;Other (comment) (hoyer lift)    Recommendations for Other Services   NA     Precautions / Restrictions Precautions Precautions: Fall;Other (comment) Precaution Comments: multiple wounds      Mobility  Bed Mobility Overal bed mobility: Needs Assistance Bed Mobility: Supine to Sit;Sit to Supine;Rolling Rolling: Total assist   Supine to sit: Total assist Sit to supine: Total assist   General bed mobility comments: Pt with no initiation with all bed mobility and supine to sit transfers.  No initiation to help, reach for therapist or stabilize herself at EOB.    Transfers                 General transfer comment: Unable, would need a maxi move total lift  Ambulation/Gait              General Gait Details: Pt does not look like she has been ambulatory in quite a long time and she cannot tell me how her family gets her into her WC to go to MD.   ty    Modified Rankin (Stroke Patients Only) Modified Rankin (Stroke Patients Only) Pre-Morbid Rankin Score: Severe disability Modified Rankin: Severe disability     Balance Overall balance assessment: Needs assistance Sitting-balance support: Feet supported;No upper extremity supported Sitting balance-Leahy Scale: Zero Sitting balance - Comments: total assist to sit EOB.  Postural control: Left lateral lean;Posterior lean     Standing balance comment: Unable to stand.                              Pertinent Vitals/Pain Pain Assessment: Faces Faces Pain Scale: Hurts even more Pain Location: with mobility, generalized Pain Intervention(s): Limited activity within patient's tolerance;Monitored during session;Repositioned    Home Living Family/patient expects to be discharged to:: Private residence Living Arrangements: Children;Other relatives Available Help at Discharge: Personal care attendant;Family             Additional Comments: per RN case manager, there is an aide for bathing and dressing going into the home.     Prior Function Level of Independence: Needs assistance         Comments: Pt appears to be essentially bed bound at baseline.  No family present to  confirm.         Extremity/Trunk Assessment   Upper Extremity Assessment: Defer to OT evaluation           Lower Extremity Assessment: RLE deficits/detail;LLE deficits/detail RLE Deficits / Details: bil feet with what appear to be diabetic ulcer wounds. Limited R leg knee flexion ROM (to ~80 degrees of knee flexion in supine with firm end feel.  DF ROM almost to neutral (lacking ~5 degrees, and hip flexion ROM assessed functionally to ~80 degrees sitting EOB.  Hip abduction in supine ~15 degrees with firm end feel.  No  active movement of legs noted during mobility or ROM.  Pt unable to move legs to command.  LLE Deficits / Details: Left leg has worse foot and toe wounds compared to right leg.  DF also lacking about 5 degrees from neutral on this side.  Knee flexion is better 90/90 at knee and hip and soft end feel.  Hip abduction ROM still limited to ~20 degrees with firm end feel.  Pt was unable to actively move this leg to command and no active movement was noted during transitions to and from side of the bed.   Cervical / Trunk Assessment: Kyphotic  Communication   Communication: Expressive difficulties  Cognition Arousal/Alertness: Awake/alert Behavior During Therapy: Flat affect Overall Cognitive Status: No family/caregiver present to determine baseline cognitive functioning (eccolalia, yes/no mostly with inconsistant accuracy)                      General Comments General comments (skin integrity, edema, etc.): Pt positioned on right side for pressure relief with heels floated.  Spoke to RN re: air mattress overlay and blue PRAFO pressure relieft boots for feet.           Assessment/Plan    PT Assessment All further PT needs can be met in the next venue of care  PT Diagnosis Difficulty walking;Abnormality of gait;Generalized weakness;Altered mental status   PT Problem List Decreased strength;Decreased range of motion;Decreased activity tolerance;Decreased balance;Decreased mobility;Decreased cognition;Decreased safety awareness;Decreased knowledge of precautions;Impaired sensation;Impaired tone;Obesity;Pain;Decreased skin integrity  PT Treatment Interventions     PT Goals (Current goals can be found in the Care Plan section) Acute Rehab PT Goals Patient Stated Goal: unable to state PT Goal Formulation: No goals set, d/c therapy     End of Session   Activity Tolerance: Patient limited by fatigue;Patient limited by pain Patient left: in bed;with call bell/phone within reach;with bed  alarm set;with nursing/sitter in room Nurse Communication: Other (comment) (recommendations)         Time: 4562-5638 PT Time Calculation (min): 17 min   Charges:   PT Evaluation $Initial PT Evaluation Tier I: 1 Procedure PT Treatments $Therapeutic Activity: 8-22 mins        Zilphia Kozinski B. Nohea Kras, PT, DPT [phone]   07/05/2014, 11:46 AM

## 2014-07-05 NOTE — Progress Notes (Signed)
Nutrition Brief Note  Patient identified on the Malnutrition Screening Tool (MST) Report  Wt Readings from Last 15 Encounters:  07/04/14 203 lb 1.6 oz (92.126 kg)  06/28/14 220 lb (99.791 kg)  06/09/14 220 lb (99.791 kg)  05/11/13 198 lb (89.812 kg)  04/18/13 198 lb 12.8 oz (90.175 kg)  01/26/12 217 lb 8 oz (98.657 kg)  12/26/11 221 lb 9 oz (100.5 kg)  03/14/11 232 lb (105.235 kg)  08/07/10 233 lb 6.1 oz (105.861 kg)  05/06/10 230 lb 4 oz (104.441 kg)  04/15/10 236 lb (107.049 kg)  04/08/10 236 lb (107.049 kg)  02/15/10 234 lb (106.142 kg)  10/11/09 233 lb (105.688 kg)  08/08/09 240 lb (108.863 kg)    Body mass index is 32.8 kg/(m^2). Patient meets criteria for Obesity based on current BMI.   Current diet order is Heart Healthy/Carb Modified, patient is consuming approximately 50-100% of meals at this time. Pt denies any recent weight loss. She states that her appetite is good and she was eating well PTA. Pt appears well-nourished with no evidence of wasting in physical exam. Labs and medications reviewed.   No nutrition interventions warranted at this time. If nutrition issues arise, please consult RD.   Pryor Ochoa RD, LDN Inpatient Clinical Dietitian Pager: [phone] After Hours Pager: [phone]

## 2014-07-05 NOTE — Evaluation (Signed)
Speech Language Pathology Evaluation Patient Details Name: [name] MRN: [id number] DOB: 1933-10-09 Today's Date: 07/05/2014 Time: 4098-1191 SLP Time Calculation (min): 16 min  Problem List:  Patient Active Problem List   Diagnosis Date Noted  . CVA (cerebral infarction) 07/04/2014  . Blurred vision, bilateral 10/01/2012  . Gait disorder 10/01/2012  . Chronic diastolic heart failure 47/82/9562  . NSTEMI (non-ST elevated myocardial infarction) 12/25/2011  . Anemia of chronic kidney failure 12/25/2011  . CKD (chronic kidney disease), stage IV 12/25/2011  . Mild protein-calorie malnutrition 12/25/2011  . HEMORRHOIDS 08/08/2009  . 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 Atrial fibrillation 08/10/2007  . INTERSTITIAL LUNG DISEASE 08/10/2007  . Diabetes mellitus 06/03/2007  . DEPRESSION 06/03/2007  . Essential hypertension 06/03/2007  . Systolic CHF, chronic 13/04/6577  . COPD (chronic obstructive pulmonary disease) 06/03/2007  . GERD 06/03/2007  . PEPTIC ULCER DISEASE 06/03/2007  . OSTEOARTHRITIS 06/03/2007  . TRANSIENT ISCHEMIC ATTACK, HX OF 06/03/2007   Past Medical History:  Past Medical History  Diagnosis Date  . GERD 06/03/2007  . HYPERTENSION 06/03/2007  . OSTEOARTHRITIS 06/03/2007  . PEPTIC ULCER DISEASE 06/03/2007  . TRANSIENT ISCHEMIC ATTACK, HX OF 06/03/2007  . CONGESTIVE HEART FAILURE 06/03/2007    -2d echo 4/69/62 mild diastolic dysfunction, nl LA, RV  . DIABETES MELLITUS, TYPE II 06/03/2007  . LOW BACK PAIN 06/03/2007  . COPD 06/03/2007    Dr Wende Mott  . DEPRESSION 06/03/2007  . 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 Atrial fibrillation 08/10/2007  . HEMORRHOIDS 08/08/2009  . CONSTIPATION, INTERMITTENT 02/15/2010  . DYSPNEA/SHORTNESS OF BREATH 06/09/2008  . INTERSTITIAL LUNG DISEASE 08/10/2007    -onset 12/03 with definite steroid responsive component -PFT's October 11, 2009: VC 67%, no airflow obstruction DLC0 14%  . Allergic rhinitis    . Diverticulosis 05/11/2011  . Goiter 05/11/2011  . Shingles 05/11/2011   Past Surgical History:  Past Surgical History  Procedure Laterality Date  . Stomach tumors removed  08/22/1991  . Abdominal hysterectomy  10/31/1983    TAH   HPI:  80 y.o. female admitted to [facility] from [facility] ED due to [place].  Stroke workup pending as medical workup was negative (urine cultures and chest x-ray).  CT negative, but MRI revealed Moderate size acute RIGHT PCA territory infarct, affecting the RIGHT occipital lobe.  Pt with significant PMHx of HTN, OA, TIA, DM, low back pain, COPD, A-fib, interstitial lung disease, and stomach tumors s/p removal.     Assessment / Plan / Recommendation Clinical Impression  Pt presents with cognitive impairments including disorientation to date and situation, very limited intellectual awareness of impairments despite Max cues from SLP, decreased sustained attention, and auditory comprehension with mildly complex information. Suspect that current mentation is contributing in large part to communication difficulties. Echolalia was noted throughout session, and pt had difficulty trying to complete mildly complex linguistic tasks with Max cues from SLP. Pt will benefit from skilled SLP services to maximize functional cognition and communication.    SLP Assessment  Patient needs continued Speech Lanaguage Pathology Services    Follow Up Recommendations  Skilled Nursing facility;24 hour supervision/assistance    Frequency and Duration min 2x/week  2 weeks   Pertinent Vitals/Pain Pain Assessment: No/denies pain   SLP Goals  Patient/Family Stated Goal: none stated Potential to Achieve Goals: Fair Potential Considerations: Ability to learn/carryover information  SLP Evaluation Prior Functioning  Cognitive/Linguistic Baseline: Information not available   Cognition  Overall Cognitive Status: History of cognitive  impairments - at baseline Arousal/Alertness: Awake/alert Orientation  Level: Oriented to person Attention: Sustained Sustained Attention: Impaired Sustained Attention Impairment: Verbal basic;Functional basic Memory: Impaired Memory Impairment: Decreased recall of new information Awareness: Impaired Awareness Impairment: Intellectual impairment Problem Solving: Impaired Problem Solving Impairment: Verbal basic    Comprehension  Auditory Comprehension Overall Auditory Comprehension: Impaired Yes/No Questions: Impaired Basic Biographical Questions: 76-100% accurate Basic Immediate Environment Questions: 75-100% accurate Complex Questions: 25-49% accurate Commands: Impaired One Step Basic Commands: 75-100% accurate Two Step Basic Commands: 0-24% accurate Conversation: Simple Visual Recognition/Discrimination Discrimination: Not tested (visual deficits) Reading Comprehension Reading Status: Unable to assess (comment) (visual deficits)    Expression Expression Primary Mode of Expression: Verbal Verbal Expression Overall Verbal Expression: Impaired Initiation: No impairment Automatic Speech: Name;Social Response Level of Generative/Spontaneous Verbalization: Phrase;Sentence Naming: Impairment Responsive: 51-75% accurate Confrontation: Not tested Divergent: 0-24% accurate (unable to complete with cueing) Verbal Errors: Other (comment) (anomia) Interfering Components: Attention Written Expression Dominant Hand: Left (only using L UE) Written Expression: Not tested   Oral / Motor Motor Speech Overall Motor Speech: Appears within functional limits for tasks assessed   GO      Germain Osgood, M.A. CCC-SLP [phone]  Germain Osgood 07/05/2014, 4:53 PM

## 2014-07-05 NOTE — Progress Notes (Signed)
CBG 48 this morning at 0655.  Orange juice given and hypoglycemic protocol started. Pt in no apparent distress. CBG checked again 15 min later and 64. More orange juice given. Physician paged. Carlin Attridge, Rande Brunt

## 2014-07-05 NOTE — Progress Notes (Signed)
Prevalon boots ordered from [employer].

## 2014-07-05 NOTE — Evaluation (Signed)
Occupational Therapy Evaluation Patient Details Name: MAIREN WALLENSTEIN MRN: [id number] DOB: 1933-12-08 Today's Date: 07/05/2014    History of Present Illness 80 y.o. female admitted to [facility] from [facility] ED due to [place].  Stroke workup pending as medical workup was negative (urine cultures and chest x-ray).  CT negative, but MRI revealed Moderate size acute RIGHT PCA territory infarct, affecting the RIGHT occipital lobe.  Pt with significant PMHx of HTN, OA, TIA, DM, low back pain, COPD, A-fib, interstitial lung disease, and stomach tumors s/p removal.    Clinical Impression   Patient evaluated by Occupational Therapy with no further acute OT needs identified. All education has been completed and the patient has no further questions. See below for any follow-up Occupational Therapy or equipment needs. OT to sign off. Thank you for referral.   Defer all further OT to SNF LEVEL     Follow Up Recommendations  SNF;Supervision/Assistance - 24 hour    Equipment Recommendations  Other (comment) (hoyer lift, w/c with cushion hosptial bed)    Recommendations for Other Services       Precautions / Restrictions Precautions Precautions: Fall;Other (comment) Precaution Comments: multiple wounds      Mobility Bed Mobility Overal bed mobility: +2 for physical assistance;Needs Assistance;+ 2 for safety/equipment Bed Mobility: Rolling Rolling: +2 for physical assistance;Total assist   Supine to sit: Total assist Sit to supine: Total assist   General bed mobility comments: total (A) with no initiation. pt rotating neck slightly right once during session with max cueing and tactile input.   Transfers                 General transfer comment: unable to complete. repositioned in the bed    Balance Overall balance assessment: Needs assistance Sitting-balance support: Feet supported;No upper extremity supported Sitting balance-Leahy Scale: Zero Sitting balance - Comments: total assist to sit EOB.   Postural control: Left lateral lean;Posterior lean     Standing balance comment: Unable to stand.                             ADL Overall ADL's : Needs assistance/impaired;At baseline                                       General ADL Comments: Pt incontinent of bowel on arrival and unaware. pt reports DOB as [birth year]. Pt asked 5 yes no questions and ~75% accurate for personal questions. Pt noted to have wounds on bil LE, open wound L thigh and sacral wound. Pt when in side lying noted to have dry skin and poor hygiene along lateral aspects of the body that are present from PTA. Question bathing/ dressing frequency and detail PTA. Pt aroused and responding throughout session. RN jamie made aware of request for air mattress overlay , prafo boots, and wound care consult.       Vision                 Additional Comments: Pt with new occipital infarct and due to cognition deficits unable to fully assess. Pt with a static gaze midline    Perception     Praxis      Pertinent Vitals/Pain Pain Assessment: No/denies pain Faces Pain Scale: Hurts even more Pain Location: with mobility, generalized Pain Intervention(s): Limited activity within patient's tolerance;Monitored during session;Repositioned     Hand  Dominance Left (only using L UE)   Extremity/Trunk Assessment Upper Extremity Assessment Upper Extremity Assessment: RUE deficits/detail;LUE deficits/detail;Generalized weakness RUE Deficits / Details: shoulder adducted, limited abduction to 5 degrees, shoulder flexion 15 degrees, elbow extension 90 degrees with tone noted. wrist flexed and 2 out 5 grasp. Limited assessment due to cognition. pt appears to have extreme weakness in R UE  RUE Coordination: decreased fine motor;decreased gross motor LUE Deficits / Details: shoulder flexion 3- out 5, grasp 3 - out 5 abduction 15 degrees, able to be PROM full ROM elbow. Limited assessment due to cognition and  following commands LUE Coordination: decreased fine motor;decreased gross motor   Lower Extremity Assessment Lower Extremity Assessment: Defer to PT evaluation RLE Deficits / Details: bil feet with what appear to be diabetic ulcer wounds. Limited R leg knee flexion ROM (to ~80 degrees of knee flexion in supine with firm end feel.  DF ROM almost to neutral (lacking ~5 degrees, and hip flexion ROM assessed functionally to ~80 degrees sitting EOB.  Hip abduction in supine ~15 degrees with firm end feel.  No active movement of legs noted during mobility or ROM.  Pt unable to move legs to command.  LLE Deficits / Details: Left leg has worse foot and toe wounds compared to right leg.  DF also lacking about 5 degrees from neutral on this side.  Knee flexion is better 90/90 at knee and hip and soft end feel.  Hip abduction ROM still limited to ~20 degrees with firm end feel.  Pt was unable to actively move this leg to command and no active movement was noted during transitions to and from side of the bed.    Cervical / Trunk Assessment Cervical / Trunk Assessment: Kyphotic   Communication Communication Communication: Expressive difficulties   Cognition Arousal/Alertness: Awake/alert Behavior During Therapy: Flat affect Overall Cognitive Status: History of cognitive impairments - at baseline                     General Comments       Exercises       Shoulder Instructions      Home Living Family/patient expects to be discharged to:: Private residence Living Arrangements: Children;Other relatives Available Help at Discharge: Personal care attendant;Family                             Additional Comments: per RN - son is primary care provider and limited PTA information known. Pt poor historian      Prior Functioning/Environment Level of Independence: Needs assistance  Gait / Transfers Assistance Needed: chart states using W/c and bed bound- uncertain how often OOB ADL's  / Homemaking Assistance Needed: pt requries total (A)    Comments: Pt appears to be essentially bed bound at baseline.  No family present to confirm.     OT Diagnosis: Generalized weakness;Cognitive deficits   OT Problem List:     OT Treatment/Interventions:      OT Goals(Current goals can be found in the care plan section) Acute Rehab OT Goals Patient Stated Goal: unable to state OT Goal Formulation: Patient unable to participate in goal setting  OT Frequency:     Barriers to D/C:            Co-evaluation              End of Session Nurse Communication: Mobility status;Precautions;Need for lift equipment  Activity Tolerance: Patient tolerated treatment  well Patient left: in bed;with call bell/phone within reach   Time: 1010-1104 OT Time Calculation (min): 54 min Charges:  OT General Charges $OT Visit: 1 Procedure OT Evaluation $Initial OT Evaluation Tier I: 1 Procedure OT Treatments $Self Care/Home Management : 38-52 mins G-Codes:    Peri Maris 07/13/2014, 2:17 PM Pager: [phone]

## 2014-07-05 NOTE — Progress Notes (Signed)
OT NOTE  Pt could benefit from : Air mattress overlay to prevent skin break down Wound care consult for BIL LE feet and sacral wound Prafo pillow boots to prevent skin breakdown   [name]   OTR/L Pager: [phone] Office: [phone] .

## 2014-07-05 NOTE — Progress Notes (Signed)
STROKE TEAM PROGRESS NOTE   HISTORY [name] is an 80 y.o. female a history of previous strokes, TIAs, dementia, hypertension, diabetes mellitus and stage IV chronic kidney disease, brought to the emergency room because of slurred speech and increased confusion on waking up this morning. She was last seen well at 11 PM last night 08/03/2014. Blood sugar was noted to be low and she arrived in the emergency room with a value of 40. Mental status has improved since arrival and is nearly back to baseline at this point. No focal weakness was noted by family. CT scan of her head showed no acute intracranial abnormality. Showed acute right occipital infarction. MRA showed no proximal vascular stenosis or occlusion of major cranial vessels. Moderately diseased distal right PCA was noted. Patient was not administered TPA secondary to Resolving deficits, delay in arrival. She was admitted for further evaluation and treatment.   SUBJECTIVE (INTERVAL HISTORY) No family is at the bedside.  Overall she feels her condition is unchanged. She lives at home with family, but RN has not seen family since arrival to [facility]. Reports are bruising on patient - unsure of etiology. Baseline status is unknown - bed bound vs non-ambulatory.   OBJECTIVE Temp:  [97 F (36.1 C)-98.7 F (37.1 C)] 98.2 F (36.8 C) (10/14 2112) Pulse Rate:  [83-99] 90 (10/14 2112) Cardiac Rhythm:  [-] Normal sinus rhythm (10/14 2004) Resp:  [18-20] 20 (10/14 2112) BP: (98-151)/(58-83) 142/77 mmHg (10/14 2112) SpO2:  [97 %-100 %] 100 % (10/14 2112)   Recent Labs Lab 07/05/14 0718 07/05/14 0727 07/05/14 1104 07/05/14 1636 07/05/14 2115  GLUCAP 69* 93 167* 307* 205*    Recent Labs Lab 07/04/14 1111  NA 143  K 4.6  CL 105  CO2 24  GLUCOSE 119*  BUN 50*  CREATININE 1.78*  CALCIUM 8.5    Recent Labs Lab 07/04/14 1111  AST 17  ALT 13  ALKPHOS 65  BILITOT 0.5  PROT 6.6  ALBUMIN 2.6*    Recent Labs Lab 07/04/14 1111   WBC 6.8  NEUTROABS 4.8  HGB 9.9*  HCT 31.5*  MCV 92.4  PLT 173    Recent Labs Lab 07/04/14 1128  TROPONINI <0.30    Recent Labs  07/04/14 1202  LABPROT 14.8  INR 1.15    Recent Labs  07/04/14 1019  COLORURINE YELLOW  LABSPEC 1.011  PHURINE 5.0  GLUCOSEU NEGATIVE  HGBUR NEGATIVE  BILIRUBINUR NEGATIVE  KETONESUR NEGATIVE  PROTEINUR NEGATIVE  UROBILINOGEN 0.2  NITRITE NEGATIVE  LEUKOCYTESUR NEGATIVE       Component Value Date/Time   CHOL 126 07/05/2014 1520   TRIG 152* 07/05/2014 1520   HDL 49 07/05/2014 1520   CHOLHDL 2.6 07/05/2014 1520   VLDL 30 07/05/2014 1520   LDLCALC 47 07/05/2014 1520   Lab Results  Component Value Date   HGBA1C 7.6* 03/10/2014   No results found for this basename: labopia,  cocainscrnur,  labbenz,  amphetmu,  thcu,  labbarb    No results found for this basename: ETH,  in the last 168 hours  Dg Chest 2 View 07/04/2014   Chronic lung disease as demonstrated by CT on 04/10/2011. No superimposed acute findings are identified.     Ct Head Wo Contrast 07/04/2014   No acute intracranial pathology.     Mri & Mra Brain Wo Contrast 07/04/2014   Moderate size acute RIGHT PCA territory infarct, affecting the RIGHT occipital lobe. No evidence for hemorrhagic transformation.  Extreme atrophy with  evidence for remote areas of chronic infarction.  No proximal vascular stenosis or occlusion of the major intracranial vessels.  Moderately diseased distal posterior cerebral artery on the RIGHT.     Carotid Doppler  Technically difficult due to anatomy of the neck and vocal interference ( groaning) . No obvious evidence of significant ICA stenosis. Tortuosity throughout bilaterally. 1% to 39% ICA stenosis. Unable to visualize the Vertebral arteries due to anatomy of the neck.  2D Echocardiogram - Normal LV size and systolic function, EF 24-58%. Mild LVH. Normal RV size and systolic function. Borderline pulmonary hypertension.  PHYSICAL  EXAM Physical exam  Temp:  [97 F (36.1 C)-98.7 F (37.1 C)] 98.2 F (36.8 C) (10/14 2112) Pulse Rate:  [83-99] 90 (10/14 2112) Resp:  [18-20] 20 (10/14 2112) BP: (98-151)/(58-83) 142/77 mmHg (10/14 2112) SpO2:  [97 %-100 %] 100 % (10/14 2112)  General - Well nourished, well developed, in mild respiratory distress, limited ROM in all extremities.  Ophthalmologic - not cooperative on exma.  Cardiovascular - Regular rate and rhythm with no murmur.  Lung - increased RR, bilateral rhonchi and wheezing  Neuro - awake alert, but answer questions in short words, not orientated to time or president, but orientated to self and place. Difficult following commands. Not able to name and repeat only part of the sentences. Not blinking to visual threat on the left, right facial nasolabial fold flattening, b/l UE spastic at flexion position, distal 3/5 BUEs, and BLEs 0/5 proximal and 4/5 distally, but BLE also significant increased tone at knee extension position.   ASSESSMENT/PLAN [name] is a 80 y.o. female with history of previous strokes, TIAs, dementia, hypertension, diabetes mellitus and stage IV chronic kidney disease presenting with Altered mental status and slurred speech. She did not receive IV t-PA due to delay in arrival, resolving deficits.   Stroke:  Non-dominant right PCA occipital infarct, as well as chronic scattered foci of bilateral strokes, consistent with embolic pattern - Afib could be the cause of stroke, although large vessel atherosclerosis can not be excluded given multiple risk factors and right PCA arthrosclerosis.  MRA  Diseased distal R PCA  MRI  Acute R PCA occipital infarct as well as Scattered foci of chronic infarction affect the BILATERAL parietal lobes, BILATERAL cerebellum, and deep nuclei.  Carotid Doppler  No significant stenosis  2D Echo  unremarkable   Heparin 5000 units sq tid for VTE prophylaxis    carb modified/heart healthy thin  liquids  aspirin 81 mg orally every day prior to admission, now on aspirin 325 mg daily  Ongoing aggressive risk factor management  Consider change IVF to NS, dextrose contraindicated in acute stroke  Resultant left hemianopia  Therapy recommendations:  SNF  Disposition:  SNF vs home with 24h care  Atrial Fibrillation (on the problem list from 2008 entered by PCP)  Home meds:  Aspirin  Likely the cause of stroke, although large vessel atherosclerosis can not be excluded given multiple risk factors and right PCA arthrosclerosis.  According to the PCP note on 03/10/14 - pt "essentially bedbound with some wheelchair use. Cannot stand." therefore, pt has low risk of fall now.   please consider anticoagulation such as NOAC if not contraindicated.   Hypertension  Permissive hypertension for 24-48 hours (OK if less than 220/120), and gradually normalize in 5-7 days.   Stable  Hyperlipidemia  Home meds:  Mevacor, changed to pravachol in hospital  LDL 47  Continue statin at discharge  Diabetes  Hypoglycemia in hospital  On amaryl at home  HgbA1c 7.25 Apr 2014 goal < 7.0  Uncontrolled  DM education   Other Stroke Risk Factors Advanced age   Obesity, Body mass index is 32.8 kg/(m^2).    Hx stroke/TIA - TIA 2008  Other Active Problems  COPD on prednisone  Chronic diastolic HF, on lasix  Other Pertinent History  depression  Hospital day # 1  SHARON BIBY, MSN, RN, ANVP-BC, ANP-BC, [facility] Pager: [phone] 07/05/2014 9:38 PM   I, the attending vascular neurologist, have personally obtained a history, examined the patient, evaluated laboratory data, individually viewed imaging studies, and formulated the assessment and plan of care.  I have made any additions or clarifications directly to the above note and agree with the findings and plan as currently documented.   Neurology will sign off. Please call with questions. Pt will follow  up with Dr. Erlinda Hong at [facility] in about 2 months. Thanks for the consult.  Rosalin Hawking, MD PhD Stroke Neurology 07/05/2014 9:46 PM  To contact Stroke Continuity provider, please refer to [email or web address] After hours, contact General Neurology

## 2014-07-05 NOTE — Progress Notes (Signed)
15 min CBG check was 69. Continuing to give oral carbohydrates. Patient is also running D5 1/2NS @50ml /hr. CBG rechecked again and now 93. Will continue to monitor. Jetaun Colbath, Rande Brunt

## 2014-07-05 NOTE — Progress Notes (Addendum)
Upon inspection of pt feet, there are several stitches to the underside of the great and second toe on the R side.  Pt daughter called and she confirmed that these were placed 06/28/14 and need to come out in 10 days.  Stitches were placed r/t a fall pt had last week.  MD notified.  Also, foam dressing was applied to sacral wound, several skin tears around the buttocks as well.  Barrier cream applied during OT session as well as foam dressing.

## 2014-07-05 NOTE — Progress Notes (Signed)
Vascular lab called,   they confirmed pt is on their list for echocardiogram.  Pt family updated

## 2014-07-05 NOTE — Progress Notes (Signed)
UR complete.  Mitsy Owen RN, MSN 

## 2014-07-06 DIAGNOSIS — N184 Chronic kidney disease, stage 4 (severe): Secondary | ICD-10-CM

## 2014-07-06 DIAGNOSIS — N179 Acute kidney failure, unspecified: Secondary | ICD-10-CM

## 2014-07-06 DIAGNOSIS — I359 Nonrheumatic aortic valve disorder, unspecified: Secondary | ICD-10-CM

## 2014-07-06 LAB — GLUCOSE, CAPILLARY
Glucose-Capillary: 118 mg/dL — ABNORMAL HIGH (ref 70–99)
Glucose-Capillary: 88 mg/dL (ref 70–99)
Glucose-Capillary: 90 mg/dL (ref 70–99)
Glucose-Capillary: 122 mg/dL — ABNORMAL HIGH (ref 70–99)
Glucose-Capillary: 97 mg/dL (ref 70–99)

## 2014-07-06 LAB — CBC
HCT: 30.6 % — ABNORMAL LOW (ref 36.0–46.0)
Hemoglobin: 9.7 g/dL — ABNORMAL LOW (ref 12.0–15.0)
MCH: 29 pg (ref 26.0–34.0)
MCHC: 31.7 g/dL (ref 30.0–36.0)
MCV: 91.6 fL (ref 78.0–100.0)
PLATELETS: 158 10*3/uL (ref 150–400)
RBC: 3.34 MIL/uL — AB (ref 3.87–5.11)
RDW: 15.2 % (ref 11.5–15.5)
WBC: 5.7 10*3/uL (ref 4.0–10.5)

## 2014-07-06 LAB — BASIC METABOLIC PANEL
ANION GAP: 13 (ref 5–15)
BUN: 53 mg/dL — AB (ref 6–23)
CO2: 22 mEq/L (ref 19–32)
Calcium: 7.8 mg/dL — ABNORMAL LOW (ref 8.4–10.5)
Chloride: 102 mEq/L (ref 96–112)
Creatinine, Ser: 2.09 mg/dL — ABNORMAL HIGH (ref 0.50–1.10)
GFR, EST AFRICAN AMERICAN: 25 mL/min — AB (ref >90)
GFR, EST NON AFRICAN AMERICAN: 22 mL/min — AB (ref >90)
Glucose, Bld: 96 mg/dL (ref 70–99)
POTASSIUM: 5.7 meq/L — AB (ref 3.7–5.3)
SODIUM: 137 meq/L (ref 137–147)

## 2014-07-06 MED ORDER — APIXABAN 5 MG PO TABS
5.0000 mg | ORAL_TABLET | Freq: Two times a day (BID) | ORAL | Status: DC
  Administered 2014-07-06 – 2014-07-07 (×3): 5 mg via ORAL
  Filled 2014-07-06 (×4): qty 1

## 2014-07-06 MED ORDER — SODIUM CHLORIDE 0.9 % IV BOLUS (SEPSIS)
500.0000 mL | Freq: Once | INTRAVENOUS | Status: AC
  Administered 2014-07-06: 500 mL via INTRAVENOUS

## 2014-07-06 NOTE — Clinical Social Work Placement (Addendum)
[place] WORK PLACEMENT NOTE 07/06/2014  Patient:  [name], [name]  Account Number:  [id number] Admit date:  07/04/2014  Clinical Social Worker:  Glendon Axe, CLINICAL SOCIAL WORKER  Date/time:  07/06/2014 05:01 PM  Clinical Social Work is seeking post-discharge placement for this patient at the following level of care:   SKILLED NURSING   (*CSW will update this form in Epic as items are completed)   07/06/2014  Patient/family provided with [place] Department of Clinical Social Work's list of facilities offering this level of care within the geographic area requested by the patient (or if unable, by the patient's family).  07/06/2014  Patient/family informed of their freedom to choose among providers that offer the needed level of care, that participate in Medicare, Medicaid or managed care program needed by the patient, have an available bed and are willing to accept the patient.  07/06/2014  Patient/family informed of [facility]' ownership interest in [facility], as well as of the fact that they are under no obligation to receive care at this facility.  PASARR submitted to EDS on 07/06/2014 PASARR number received on 07/06/2014  FL2 transmitted to all facilities in geographic area requested by pt/family on  07/06/2014 FL2 transmitted to all facilities within larger geographic area on   Patient informed that his/her managed care company has contracts with or will negotiate with  certain facilities, including the following:  YES.   Patient/family informed of bed offers received:  07/07/2014 Patient chooses bed at [facility] Physician recommends and patient chooses bed at    Patient to be transferred to [facility]  on 07/07/2014  Patient to be transferred to facility by [facility] Patient and family notified of transfer on 07/07/2014 Name of family member notified: Pt's daughter, [name] and  pt's son [name]   The following physician request were entered in Epic:   Additional Comments:  Glendon Axe, MSW, [place] [phone] 07/06/2014 5:03 PM

## 2014-07-06 NOTE — Progress Notes (Signed)
[facility] PROGRESS NOTE  LAIKEN SANDY PPI:[id number] DOB: 1934-03-04 DOA: 07/04/2014 PCP: Cathlean Cower, MD  Assessment/Plan: 1. Acute right PCA territory infarct -Patient with history of CVA, presenting with encephalopathy -Patient placed on stroke protocol, continue aspirin and statin, await further recommendations from neurology. -Carotid Dopplers did not reveal significant ICA stenosis -2-D echo pending -Physical therapy/occupational therapy recommending SNF placement -Patient having history of Afib, neurology recommending anticoagulation with novel agent.  -Pharmacy was consulted for Eliquis dosing  2.  Type 2 diabetes mellitus -Patient on sliding scale coverage, had been on amaryl at home -She was hypoglycemic on admission with a blood sugar of 48 for which oral hypoglycemic therapy held -Blood sugars stable while off of Amaryl  3.  Hypertension. -Patient's antihypertensive agents were held on admission given presentation of acute CVA and allow for permissive hypertension -Blood pressures stable  4.  Chronic diastolic congestive heart failure -Pending repeat transthoracic echocardiogram -Clinically compensated -Stopped diuretic therapy given upward trend in creatinine  5.  Acute on Chronic Renal Failure -Labs showing upward trend in creatinine from 1.78 to 2.09 on this morning's labs.  -Likely secondary to prerenal azothemia.  -Held lasix and gave 500 cc bolus of NS  Code Status: Full Code Family Communication: I spoke with her daughter over telephone conversation Disposition Plan: Anticipate discharge to SNF in the next 24 hours   Consultants:  Neurology  Procedures:  Carotid Dopplers negative for significant ICA stenosis   HPI/Subjective: This is a pleasant 80 year old female with a history of A. fib, hypertension, peptic ulcer disease, history of CVA, who was admitted to the medicine service on 07/04/2014. She presented with mental status changes. Findings  concerning for acute CVA as MRI of brain showing moderate-sized acute right PCA territory infarct affecting the right occipital lobe. She was seen and evaluated by neurology during this hospitalization.  Objective: Filed Vitals:   07/06/14 1353  BP: 104/46  Pulse: 70  Temp: 97.7 F (36.5 C)  Resp: 18    Intake/Output Summary (Last 24 hours) at 07/06/14 1459 Last data filed at 07/06/14 1232  Gross per 24 hour  Intake    582 ml  Output      0 ml  Net    582 ml   Filed Weights   07/04/14 2016  Weight: 92.126 kg (203 lb 1.6 oz)    Exam:   General:  Patient is sedated, difficult to arouse, having difficulty following commands  Cardiovascular: Regular rate rhythm normal S1-S2  Respiratory: Normal respiratory effort, clear to auscultation bilaterally  Abdomen: Obese, soft nontender nondistended  Musculoskeletal: 1+ edema to lower extremities  Neurological: Seems improved, appears to have greater use of left side. She had 2-3/5 muscle strength to bilateral upper extremities, 2-3/5 muscle strength to bilateral lower extremities.   Data Reviewed: Basic Metabolic Panel:  Recent Labs Lab 07/04/14 1111 07/06/14 0539  NA 143 137  K 4.6 5.7*  CL 105 102  CO2 24 22  GLUCOSE 119* 96  BUN 50* 53*  CREATININE 1.78* 2.09*  CALCIUM 8.5 7.8*   Liver Function Tests:  Recent Labs Lab 07/04/14 1111  AST 17  ALT 13  ALKPHOS 65  BILITOT 0.5  PROT 6.6  ALBUMIN 2.6*   No results found for this basename: LIPASE, AMYLASE,  in the last 168 hours No results found for this basename: AMMONIA,  in the last 168 hours CBC:  Recent Labs Lab 07/04/14 1111 07/06/14 0539  WBC 6.8 5.7  NEUTROABS 4.8  --  HGB 9.9* 9.7*  HCT 31.5* 30.6*  MCV 92.4 91.6  PLT 173 158   Cardiac Enzymes:  Recent Labs Lab 07/04/14 1128  TROPONINI <0.30   BNP (last 3 results) No results found for this basename: PROBNP,  in the last 8760 hours CBG:  Recent Labs Lab 07/05/14 1636  07/05/14 2115 07/06/14 0633 07/06/14 1202 07/06/14 1212  GLUCAP 307* 205* 118* 88 90    No results found for this or any previous visit (from the past 240 hour(s)).   Studies: Mr Virgel Paling OV Contrast  07/04/2014   CLINICAL DATA:  Lethargy for 2 days. Slurred speech. History of hypertension, diabetes, and previous stroke.  EXAM: MRI HEAD WITHOUT CONTRAST  MRA HEAD WITHOUT CONTRAST  TECHNIQUE: Multiplanar, multiecho pulse sequences of the brain and surrounding structures were obtained without intravenous contrast. Angiographic images of the head were obtained using MRA technique without contrast.  COMPARISON:  CT head 07/04/2014.  FINDINGS: MRI HEAD FINDINGS  There is a moderate-sized area of acute infarction affecting the RIGHT occipital lobe involving the medial cortex and subcortical white matter. Slight restricted diffusion in the adjacent corpus callosum. No contralateral, deep nuclei, or brainstem involvement.  No acute hemorrhage, mass lesion, or extra-axial fluid.  Hydrocephalus ex vacuo. Extreme cortical atrophy also affects the brainstem and cerebellum. Scattered foci of chronic infarction affect the BILATERAL parietal lobes, BILATERAL cerebellum, and deep nuclei. Tiny foci of chronic hemorrhage are evident in the RIGHT parietal lobe, within the area of chronic infarction. Prominent perivascular spaces. Flow voids are maintained. Unremarkable pituitary and cerebellar tonsils. No worrisome osseous lesions. Negative orbits. No acute sinus or mastoid fluid.  Compared with the CT earlier today, the RIGHT PCA infarct is not clearly discernible as acute.  MRA HEAD FINDINGS  The internal carotid arteries are widely patent. The basilar artery is widely patent with both vertebrals contributing, LEFT dominant. Mild non stenotic irregularity both anterior cerebral arteries proximally. No proximal MCA stenosis. Both posterior cerebral arteries are widely patent throughout their P1 and P2 segments. The RIGHT  P3 PCA segment appears moderately to severely disease. Slight irregularity distal MCA branches also noted. No intracranial aneurysm. No visible cerebellar branch occlusion.  IMPRESSION: Moderate size acute RIGHT PCA territory infarct, affecting the RIGHT occipital lobe. No evidence for hemorrhagic transformation.  Extreme atrophy with evidence for remote areas of chronic infarction.  No proximal vascular stenosis or occlusion of the major intracranial vessels.  Moderately diseased distal posterior cerebral artery on the RIGHT.   Electronically Signed   By: Rolla Flatten M.D.   On: 07/04/2014 16:10   Mr Brain Wo Contrast  07/04/2014   CLINICAL DATA:  Lethargy for 2 days. Slurred speech. History of hypertension, diabetes, and previous stroke.  EXAM: MRI HEAD WITHOUT CONTRAST  MRA HEAD WITHOUT CONTRAST  TECHNIQUE: Multiplanar, multiecho pulse sequences of the brain and surrounding structures were obtained without intravenous contrast. Angiographic images of the head were obtained using MRA technique without contrast.  COMPARISON:  CT head 07/04/2014.  FINDINGS: MRI HEAD FINDINGS  There is a moderate-sized area of acute infarction affecting the RIGHT occipital lobe involving the medial cortex and subcortical white matter. Slight restricted diffusion in the adjacent corpus callosum. No contralateral, deep nuclei, or brainstem involvement.  No acute hemorrhage, mass lesion, or extra-axial fluid.  Hydrocephalus ex vacuo. Extreme cortical atrophy also affects the brainstem and cerebellum. Scattered foci of chronic infarction affect the BILATERAL parietal lobes, BILATERAL cerebellum, and deep nuclei. Tiny foci of chronic hemorrhage are  evident in the RIGHT parietal lobe, within the area of chronic infarction. Prominent perivascular spaces. Flow voids are maintained. Unremarkable pituitary and cerebellar tonsils. No worrisome osseous lesions. Negative orbits. No acute sinus or mastoid fluid.  Compared with the CT earlier  today, the RIGHT PCA infarct is not clearly discernible as acute.  MRA HEAD FINDINGS  The internal carotid arteries are widely patent. The basilar artery is widely patent with both vertebrals contributing, LEFT dominant. Mild non stenotic irregularity both anterior cerebral arteries proximally. No proximal MCA stenosis. Both posterior cerebral arteries are widely patent throughout their P1 and P2 segments. The RIGHT P3 PCA segment appears moderately to severely disease. Slight irregularity distal MCA branches also noted. No intracranial aneurysm. No visible cerebellar branch occlusion.  IMPRESSION: Moderate size acute RIGHT PCA territory infarct, affecting the RIGHT occipital lobe. No evidence for hemorrhagic transformation.  Extreme atrophy with evidence for remote areas of chronic infarction.  No proximal vascular stenosis or occlusion of the major intracranial vessels.  Moderately diseased distal posterior cerebral artery on the RIGHT.   Electronically Signed   By: Rolla Flatten M.D.   On: 07/04/2014 16:10    Scheduled Meds: .  stroke: mapping our early stages of recovery book   Does not apply Once  . antiseptic oral rinse  7 mL Mouth Rinse q12n4p  . apixaban  5 mg Oral BID  . chlorhexidine  15 mL Mouth Rinse BID  . insulin aspart  0-9 Units Subcutaneous TID WC  . pantoprazole  40 mg Oral Daily  . pravastatin  40 mg Oral q1800  . predniSONE  10 mg Oral Q breakfast  . tiotropium  18 mcg Inhalation Daily   Continuous Infusions:    Active Problems:   Diabetes mellitus   Essential hypertension   COPD (chronic obstructive pulmonary disease)   INTERSTITIAL LUNG DISEASE   Anemia of chronic kidney failure   CKD (chronic kidney disease), stage IV   Mild protein-calorie malnutrition   CVA (cerebral infarction)    Time spent: 35 min    Kelvin Cellar  [facility] Pager [phone]. If 7PM-7AM, please contact night-coverage at [email or web address], password [facility] 07/06/2014, 2:59 PM  LOS: 2  days

## 2014-07-06 NOTE — Progress Notes (Signed)
Echo Lab  2D Echocardiogram completed.  [place], RDCS 07/06/2014 9:03 AM

## 2014-07-06 NOTE — Clinical Social Work Psychosocial (Signed)
Clinical Social Work Department BRIEF PSYCHOSOCIAL ASSESSMENT 07/06/2014  Patient:  [name], [name]     Account Number:  [id number]     Admit date:  07/04/2014  Clinical Social Worker:  Glendon Axe, CLINICAL SOCIAL WORKER  Date/Time:  07/06/2014 04:50 PM  Referred by:  Physician  Date Referred:  07/06/2014 Referred for  SNF Placement   Other Referral:   Interview type:  Other - See comment Other interview type:   CSW spoke with patient's daughter(Annie) and son(Carlyle) via telephone.    PSYCHOSOCIAL DATA Living Status:  ALONE Admitted from facility:   Level of care:   Primary support name:  Athene Schuhmacher [phone] Primary support relationship to patient:  CHILD, ADULT Degree of support available:   Strong    CURRENT CONCERNS Current Concerns  Post-Acute Placement   Other Concerns:    SOCIAL WORK ASSESSMENT / PLAN CSW spoke with pt's daughter and son in reference to post-acute placement. CSW reviewed SNF list and explained SNF process with family. Pt's daughter and son both agreeable to SNF placement and are open to considering all possible facilities. CSW to follow patient and continue to provide support and facilitate discharge needs when pt is medically stable.   Assessment/plan status:  Psychosocial Support/Ongoing Assessment of Needs Other assessment/ plan:   Information/referral to community resources:   CSW reviewed SNF list and explained process with pt's daughter and son.    PATIENT'S/FAMILY'S RESPONSE TO PLAN OF CARE: Pt lying in bed, alert and oriented to person only. Pt's daughter and son both agreeable to post-acute placement to SNF. CSW remains available as needed.       Glendon Axe, MSW, LCSWA [phone] 07/06/2014 5:00 PM

## 2014-07-06 NOTE — Progress Notes (Signed)
ANTICOAGULATION CONSULT NOTE - Initial Consult  Pharmacy Consult for Eliquis Indication: atrial fibrillation  Allergies  Allergen Reactions  . Levofloxacin Nausea Only  . Ultram [Tramadol Hcl] Nausea Only    Patient Measurements: Height: 5' 6.14" (168 cm) Weight: 203 lb 1.6 oz (92.126 kg) IBW/kg (Calculated) : 59.63  Vital Signs: Temp: 97.5 F (36.4 C) (10/15 0541) Temp Source: Oral (10/15 0541) BP: 133/90 mmHg (10/15 0541) Pulse Rate: 73 (10/15 0541)  Labs:  Recent Labs  07/04/14 1111 07/04/14 1128 07/04/14 1202 07/06/14 0539  HGB 9.9*  --   --  9.7*  HCT 31.5*  --   --  30.6*  PLT 173  --   --  158  LABPROT  --   --  14.8  --   INR  --   --  1.15  --   CREATININE 1.78*  --   --  2.09*  TROPONINI  --  <0.30  --   --     Estimated Creatinine Clearance: 25.4 ml/min (by C-G formula based on Cr of 2.09).   Medical History: Past Medical History  Diagnosis Date  . GERD 06/03/2007  . HYPERTENSION 06/03/2007  . OSTEOARTHRITIS 06/03/2007  . PEPTIC ULCER DISEASE 06/03/2007  . TRANSIENT ISCHEMIC ATTACK, HX OF 06/03/2007  . CONGESTIVE HEART FAILURE 06/03/2007    -2d echo 6/73/41 mild diastolic dysfunction, nl LA, RV  . DIABETES MELLITUS, TYPE II 06/03/2007  . LOW BACK PAIN 06/03/2007  . COPD 06/03/2007    Dr Wende Mott  . DEPRESSION 06/03/2007  . 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 Atrial fibrillation 08/10/2007  . HEMORRHOIDS 08/08/2009  . CONSTIPATION, INTERMITTENT 02/15/2010  . DYSPNEA/SHORTNESS OF BREATH 06/09/2008  . INTERSTITIAL LUNG DISEASE 08/10/2007    -onset 12/03 with definite steroid responsive component -PFT's October 11, 2009: VC 67%, no airflow obstruction DLC0 14%  . Allergic rhinitis   . Diverticulosis 05/11/2011  . Goiter 05/11/2011  . Shingles 05/11/2011    Medications:  Prescriptions prior to admission  Medication Sig Dispense Refill  . Acetaminophen-Codeine (TYLENOL/CODEINE #3) 300-30 MG per tablet Take 1 tablet by mouth every 6  (six) hours as needed for pain.  30 tablet  0  . aspirin (ECOTRIN LOW STRENGTH) 81 MG EC tablet Take 81 mg by mouth every morning.       . cephALEXin (KEFLEX) 500 MG capsule Take 1 capsule (500 mg total) by mouth 4 (four) times daily.  40 capsule  0  . furosemide (LASIX) 20 MG tablet 3 tabs by mouth in the AM daily and 3 tabs in the evening as need for edema      . glimepiride (AMARYL) 2 MG tablet take 1 tablet by mouth once daily BEFORE BREAKFAST  90 tablet  3  . lovastatin (MEVACOR) 40 MG tablet Take 1 tablet (40 mg total) by mouth at bedtime.  90 tablet  3  . nebivolol (BYSTOLIC) 10 MG tablet Take 10 mg by mouth every morning.      Marland Kitchen omeprazole (PRILOSEC) 20 MG capsule Take 20 mg by mouth every morning.      . potassium chloride SA (K-DUR,KLOR-CON) 20 MEQ tablet Take 20 mEq by mouth 3 (three) times daily.       . predniSONE (DELTASONE) 10 MG tablet take 1 tablet by mouth once daily  30 tablet  3  . silver sulfADIAZINE (SILVADENE) 1 % cream Apply 1 application topically daily.  50 g  0  . tiotropium (SPIRIVA) 18 MCG inhalation capsule  Place 18 mcg into inhaler and inhale daily.       Scheduled:  .  stroke: mapping our early stages of recovery book   Does not apply Once  . antiseptic oral rinse  7 mL Mouth Rinse q12n4p  . aspirin  300 mg Rectal Daily   Or  . aspirin  325 mg Oral Daily  . cephALEXin  500 mg Oral QID  . chlorhexidine  15 mL Mouth Rinse BID  . heparin  5,000 Units Subcutaneous 3 times per day  . insulin aspart  0-9 Units Subcutaneous TID WC  . pantoprazole  40 mg Oral Daily  . pravastatin  40 mg Oral q1800  . predniSONE  10 mg Oral Q breakfast  . sodium chloride  500 mL Intravenous Once  . tiotropium  18 mcg Inhalation Daily    Assessment: 80yo female admitted w/ stroke w/ Afib as possible cause, had not been anticoagulated as was fall risk, now essentially bedbound w/ some wheelchair use, neuro recommends NOAC as secondary CVA Px, to start Eliquis.    Plan:  Will  begin Eliquis 5mg  PO BID and begin anticoag education (of note, pt is 80 w/ SCr 2.1; if SCr remains >1.5 or wt drops below 60kg, dose will need to change to 2.5mg  BID after pt turns 80 years old).  Wynona Neat, PharmD, BCPS  07/06/2014,7:32 AM

## 2014-07-07 LAB — BASIC METABOLIC PANEL
Anion gap: 12 (ref 5–15)
BUN: 51 mg/dL — ABNORMAL HIGH (ref 6–23)
CHLORIDE: 103 meq/L (ref 96–112)
CO2: 23 mEq/L (ref 19–32)
Calcium: 8 mg/dL — ABNORMAL LOW (ref 8.4–10.5)
Creatinine, Ser: 1.91 mg/dL — ABNORMAL HIGH (ref 0.50–1.10)
GFR calc non Af Amer: 24 mL/min — ABNORMAL LOW (ref >90)
GFR, EST AFRICAN AMERICAN: 28 mL/min — AB (ref >90)
Glucose, Bld: 33 mg/dL — CL (ref 70–99)
POTASSIUM: 5 meq/L (ref 3.7–5.3)
Sodium: 138 mEq/L (ref 137–147)

## 2014-07-07 LAB — GLUCOSE, CAPILLARY
Glucose-Capillary: 44 mg/dL — CL (ref 70–99)
Glucose-Capillary: 82 mg/dL (ref 70–99)
Glucose-Capillary: 87 mg/dL (ref 70–99)

## 2014-07-07 MED ORDER — DEXTROSE 50 % IV SOLN
INTRAVENOUS | Status: AC
  Administered 2014-07-07: 30 mL
  Filled 2014-07-07: qty 50

## 2014-07-07 MED ORDER — APIXABAN 5 MG PO TABS: 5.0000 mg | ORAL_TABLET | Freq: Two times a day (BID) | ORAL | Status: AC

## 2014-07-07 MED ORDER — DEXTROSE 50 % IV SOLN: 1.0000 | Freq: Once | INTRAVENOUS | Status: AC

## 2014-07-07 NOTE — Progress Notes (Signed)
CRITICAL VALUE ALERT  Critical value received:  cbg 33  Date of notification:  07/07/2014  Time of notification:  0650  Critical value read back:Yes.    Nurse who received alert:  Kandace Parkins  MD notified (1st page):  Coralyn Pear  Time of first page:    MD notified (2nd page):  Time of second page:  Responding MD:    Time MD responded:  [phone]

## 2014-07-07 NOTE — Discharge Instructions (Signed)
Apixaban oral tablets What is this medicine? APIXABAN (a PIX a ban) is an anticoagulant (blood thinner). It is used to lower the chance of stroke in people with a medical condition called atrial fibrillation. It is also used to treat or prevent blood clots in the lungs or in the veins. This medicine may be used for other purposes; ask your health care provider or pharmacist if you have questions. COMMON BRAND NAME(S): Eliquis What should I tell my health care provider before I take this medicine? They need to know if you have any of these conditions: -bleeding disorders -bleeding in the brain -blood in your stools (black or tarry stools) or if you have blood in your vomit -history of stomach bleeding -kidney disease -liver disease -mechanical heart valve -an unusual or allergic reaction to apixaban, other medicines, foods, dyes, or preservatives -pregnant or trying to get pregnant -breast-feeding How should I use this medicine? Take this medicine by mouth with a glass of water. Follow the directions on the prescription label. You can take it with or without food. If it upsets your stomach, take it with food. Take your medicine at regular intervals. Do not take it more often than directed. Do not stop taking except on your doctor's advice. Stopping this medicine may increase your risk of a blot clot. Be sure to refill your prescription before you run out of medicine. Talk to your pediatrician regarding the use of this medicine in children. Special care may be needed. Overdosage: If you think you have taken too much of this medicine contact a poison control center or emergency room at once. NOTE: This medicine is only for you. Do not share this medicine with others. What if I miss a dose? If you miss a dose, take it as soon as you can. If it is almost time for your next dose, take only that dose. Do not take double or extra doses. What may interact with this medicine? This medicine may  interact with the following: -aspirin and aspirin-like medicines -certain medicines for fungal infections like ketoconazole and itraconazole -certain medicines for seizures like carbamazepine and phenytoin -certain medicines that treat or prevent blood clots like warfarin, enoxaparin, and dalteparin -clarithromycin -NSAIDs, medicines for pain and inflammation, like ibuprofen or naproxen -rifampin -ritonavir -St. John's wort This list may not describe all possible interactions. Give your health care provider a list of all the medicines, herbs, non-prescription drugs, or dietary supplements you use. Also tell them if you smoke, drink alcohol, or use illegal drugs. Some items may interact with your medicine. What should I watch for while using this medicine? Notify your doctor or health care professional and seek emergency treatment if you develop breathing problems; changes in vision; chest pain; severe, sudden headache; pain, swelling, warmth in the leg; trouble speaking; sudden numbness or weakness of the face, arm, or leg. These can be signs that your condition has gotten worse. If you are going to have surgery, tell your doctor or health care professional that you are taking this medicine. Tell your health care professional that you use this medicine before you have a spinal or epidural procedure. Sometimes people who take this medicine have bleeding problems around the spine when they have a spinal or epidural procedure. This bleeding is very rare. If you have a spinal or epidural procedure while on this medicine, call your health care professional immediately if you have back pain, numbness or tingling (especially in your legs and feet), muscle weakness, paralysis, or loss   of bladder or bowel control. Avoid sports and activities that might cause injury while you are using this medicine. Severe falls or injuries can cause unseen bleeding. Be careful when using sharp tools or knives. Consider using  an electric razor. Take special care brushing or flossing your teeth. Report any injuries, bruising, or red spots on the skin to your doctor or health care professional. What side effects may I notice from receiving this medicine? Side effects that you should report to your doctor or health care professional as soon as possible: -allergic reactions like skin rash, itching or hives, swelling of the face, lips, or tongue -signs and symptoms of bleeding such as bloody or black, tarry stools; red or dark-brown urine; spitting up blood or brown material that looks like coffee grounds; red spots on the skin; unusual bruising or bleeding from the eye, gums, or nose This list may not describe all possible side effects. Call your doctor for medical advice about side effects. You may report side effects to FDA at 1-800-FDA-1088. Where should I keep my medicine? Keep out of the reach of children. Store at room temperature between 20 and 25 degrees C (68 and 77 degrees F). Throw away any unused medicine after the expiration date. NOTE: This sheet is a summary. It may not cover all possible information. If you have questions about this medicine, talk to your doctor, pharmacist, or health care provider.  2015, Elsevier/Gold Standard. (2013-05-13 11:59:24)  

## 2014-07-07 NOTE — Progress Notes (Signed)
Discharge orders received. Pt for discharge home today. IV d/c'd. Pt given discharge instructions with verbalized understanding. Family in room to assist with discharge. [facility] present to transport pt to [facility].

## 2014-07-07 NOTE — Progress Notes (Signed)
Report called to RN at [facility]. All questions answered.

## 2014-07-07 NOTE — Progress Notes (Signed)
Patient scheduled for discharge to [facility] today. CSW has notified facility, family, and nurse to call and give report to receiving facility. Discharge packet completed. PT[facility] transportation arranged for 2PM and pt's son plans to ride with pt. No further intervention needed. CSW signing off.   Glendon Axe, MSW, LCSWA [phone] 07/07/2014 12:50 PM

## 2014-07-07 NOTE — Progress Notes (Signed)
CARE MANAGEMENT NOTE 07/07/2014  Patient:  [name], [name]   Account Number:  [id number]  Date Initiated:  07/05/2014  Documentation initiated by:  Casee Knepp  Subjective/Objective Assessment:   Patient was admitted with CVA. Lives at home with family members. Per chart, is bedridden at baseline and has a home health aide to assist with bathing.     Action/Plan:   Will follow for discharge needs.   Anticipated DC Date:     Anticipated DC Plan:  [place]  CM consult      Choice offered to / List presented to:             Status of service:  In process, will continue to follow Medicare Important Message given?  YES (If response is "NO", the following Medicare IM given date fields will be blank) Date Medicare IM given:  07/07/2014 Medicare IM given by:  Lorne Skeens Date Additional Medicare IM given:   Additional Medicare IM given by:    Discharge Disposition:    Per UR Regulation:  Reviewed for med. necessity/level of care/duration of stay  If discussed at [place] of Stay Meetings, dates discussed:    Comments:  07/07/14 [place], MSN, CM- Medicare IM letter provided.

## 2014-07-07 NOTE — Discharge Summary (Signed)
Physician Discharge Summary  [name] MLY:[id number] DOB: 09/12/1934 DOA: 07/04/2014  PCP: Cathlean Cower, MD  Admit date: 07/04/2014 Discharge date: 07/07/2014  Time spent: 35 minutes  Recommendations for Outpatient Follow-up:  1. Please follow up on CBG's, she was hypoglycemic during this hospitalization. Oral hypoglycemic therapy stopped.  2. Repeat BMP and CBC in -2 days. Had acute on chronic renal failure for which lasix was stopped. Monitor volume status as diuretic therapy may need to be restarted.   Discharge Diagnoses:  Principal Problem:   CVA (cerebral infarction) Active Problems:   Diabetes mellitus   Essential hypertension   COPD (chronic obstructive pulmonary disease)   INTERSTITIAL LUNG DISEASE   Anemia of chronic kidney failure   CKD (chronic kidney disease), stage IV   Mild protein-calorie malnutrition   Discharge Condition: Stable  Diet recommendation: Heart Healthy/Carb modified  Filed Weights   07/04/14 2016  Weight: 92.126 kg (203 lb 1.6 oz)    History of present illness:  [name] is a 80 y.o. female has a past medical history significant for HTN, history of A fib, PUD, prior CVA with right sided weakness per son, TIAs, DM, is being brought to the ED by her son for altered mental status. He states that on waking up she had slurred speech and was staring into space without making much sense. He called EMS and on EMS arrival her CBG was normal, however in the ED was found to be low at 40. She received an amp of D50 and her CBG normalized, however her mental status did not return to baseline. Per her son, she did not reports any chest pain, shortness of breath, headaches, lightheadedness or dizziness, no abdominal pain, nausea or vomiting. In the ED patient with acute on chronic renal failure, CBGs within normal limits. Urinalysis unremarkable and a CXR was without acute findings. She had a CT scan of the head and was without acute findings as well. Neurology  consulted by EDP and [facility] asked for admission.    Hospital Course:  This is a pleasant 80 year old female with a history of A. fib, hypertension, peptic ulcer disease, history of CVA, who was admitted to the medicine service on 07/04/2014. She presented with mental status changes. Findings concerning for acute CVA as MRI of brain showing moderate-sized acute right PCA territory infarct affecting the right occipital lobe. She was seen and evaluated by neurology during this hospitalization, recommending starting anticoagulation given her history of Afib. EKG during this hospitalization showed NSR.  1. Acute right PCA territory infarct -Patient with history of CVA, presenting with encephalopathy  -Patient placed on stroke protocol, continue aspirin and statin -Carotid Dopplers did not reveal significant ICA stenosis  -2-D echo showing ejection fraction of 50-55% with grade 1 diastolic dysfunction -Physical therapy/occupational therapy recommending SNF placement  -Patient having history of Afib, neurology recommending anticoagulation with novel agent.  -Pharmacy was consulted for Eliquis dosing  2. Type 2 diabetes mellitus  -Patient on sliding scale coverage, had been on amaryl at home  -She was hypoglycemic on admission with a blood sugar of 48 for which oral hypoglycemic therapy held  -Please followup on CBG's given hypoglycemia.  3. Hypertension.  -Patient's antihypertensive agents were held on admission given presentation of acute CVA and allow for permissive hypertension  -Blood pressures stable  4. Chronic diastolic congestive heart failure  -Pending repeat transthoracic echocardiogram  -Clinically compensated  -Stopped diuretic therapy given upward trend in creatinine  -Please monitor volume status, as her  Lasix may need to be restarted.  5. Acute on Chronic Renal Failure  -Labs showing upward trend in creatinine from 1.78 to 2.09 on 07/06/2014.  -Likely secondary to prerenal azothemia.   -Held lasix and gave 500 cc bolus of NS on 07/06/2014 -On 07/07/2014 creatinine trended down to 1.9 -Will continue to hold Lasix, please followup on BMP and volume status   Procedures:  Transthoracic echocardiogram impression: Ejection fraction 50-55% with grade 1 diastolic dysfunction  Consultations:  Neurology  Discharge Exam: Filed Vitals:   07/07/14 1008  BP: 146/89  Pulse: 77  Temp: 98.1 F (36.7 C)  Resp: 20  General: Patient is awake and alert, reports feeling well today. Cardiovascular: Regular rate rhythm normal S1-S2  Respiratory: Normal respiratory effort, clear to auscultation bilaterally  Abdomen: Obese, soft nontender nondistended  Musculoskeletal: 1+ edema to lower extremities  Neurological: Seems improved, appears to have greater use of left side. She had 2-3/5 muscle strength to bilateral upper extremities, 2-3/5 muscle strength to bilateral lower extremities.     Discharge Instructions You were cared for by a hospitalist during your hospital stay. If you have any questions about your discharge medications or the care you received while you were in the hospital after you are discharged, you can call the unit and asked to speak with the hospitalist on call if the hospitalist that took care of you is not available. Once you are discharged, your primary care physician will handle any further medical issues. Please note that NO REFILLS for any discharge medications will be authorized once you are discharged, as it is imperative that you return to your primary care physician (or establish a relationship with a primary care physician if you do not have one) for your aftercare needs so that they can reassess your need for medications and monitor your lab values.  Discharge Instructions   Call MD for:  difficulty breathing, headache or visual disturbances    Complete by:  As directed      Call MD for:  extreme fatigue    Complete by:  As directed      Call MD for:   hives    Complete by:  As directed      Call MD for:  persistant dizziness or light-headedness    Complete by:  As directed      Call MD for:  persistant nausea and vomiting    Complete by:  As directed      Call MD for:  redness, tenderness, or signs of infection (pain, swelling, redness, odor or green/yellow discharge around incision site)    Complete by:  As directed      Call MD for:  severe uncontrolled pain    Complete by:  As directed      Call MD for:  temperature >100.4    Complete by:  As directed      Diet - low sodium heart healthy    Complete by:  As directed      Increase activity slowly    Complete by:  As directed           Current Discharge Medication List    START taking these medications   Details  apixaban (ELIQUIS) 5 MG TABS tablet Take 1 tablet (5 mg total) by mouth 2 (two) times daily. Qty: 60 tablet, Refills: 1      CONTINUE these medications which have NOT CHANGED   Details  lovastatin (MEVACOR) 40 MG tablet Take 1 tablet (40 mg total)  by mouth at bedtime. Qty: 90 tablet, Refills: 3    nebivolol (BYSTOLIC) 10 MG tablet Take 10 mg by mouth every morning.    omeprazole (PRILOSEC) 20 MG capsule Take 20 mg by mouth every morning.    predniSONE (DELTASONE) 10 MG tablet take 1 tablet by mouth once daily Qty: 30 tablet, Refills: 3    silver sulfADIAZINE (SILVADENE) 1 % cream Apply 1 application topically daily. Qty: 50 g, Refills: 0    tiotropium (SPIRIVA) 18 MCG inhalation capsule Place 18 mcg into inhaler and inhale daily.      STOP taking these medications     Acetaminophen-Codeine (TYLENOL/CODEINE #3) 300-30 MG per tablet      aspirin (ECOTRIN LOW STRENGTH) 81 MG EC tablet      cephALEXin (KEFLEX) 500 MG capsule      furosemide (LASIX) 20 MG tablet      glimepiride (AMARYL) 2 MG tablet      potassium chloride SA (K-DUR,KLOR-CON) 20 MEQ tablet        Allergies  Allergen Reactions  . Levofloxacin Nausea Only  . Ultram [Tramadol Hcl]  Nausea Only   Follow-up Information   Follow up with Cathlean Cower, MD.   Specialties:  Internal Medicine, Radiology   Contact information:   [address] [place] [address] [phone]       Follow up with [facility] In 3 weeks.   Contact information:   [address] [place] [address] [phone]       The results of significant diagnostics from this hospitalization (including imaging, microbiology, ancillary and laboratory) are listed below for reference.    Significant Diagnostic Studies: Dg Chest 2 View  07/04/2014   CLINICAL DATA:  80 year old female with acute cough and altered mental status. Initial encounter.  EXAM: CHEST  2 VIEW  COMPARISON:  04/17/2013 and earlier.  FINDINGS: Semi upright AP and lateral views of the chest. Stable low lung volumes. Stable cardiomegaly and mediastinal contours. Coarse perihilar and other interstitial pulmonary opacity over the series of exams. Patient arms are not raised on the lateral view. No superimposed acute pulmonary opacity identified. Stable trachea, chronic right thyroid goiter. Osteopenia and widespread osseous degenerative changes. No acute osseous abnormality identified.  IMPRESSION: Chronic lung disease as demonstrated by CT on 04/10/2011. No superimposed acute findings are identified.   Electronically Signed   By: Lars Pinks M.D.   On: 07/04/2014 11:42   Ct Head Wo Contrast  07/04/2014   EXAM: CT HEAD WITHOUT CONTRAST  TECHNIQUE: Contiguous axial images were obtained from the base of the skull through the vertex without intravenous contrast.  COMPARISON:  01/18/2004  FINDINGS: There is no evidence of mass effect, midline shift, or extra-axial fluid collections. There is no evidence of a space-occupying lesion or intracranial hemorrhage. There is no evidence of a cortical-based area of acute infarction. There is generalized cerebral atrophy. There is periventricular white matter low attenuation likely  secondary to microangiopathy.  There is mild ventriculomegaly commensurate with the degree of atrophy. The basal cisterns are patent.  Visualized portions of the orbits are unremarkable. The visualized portions of the paranasal sinuses and mastoid air cells are unremarkable. Cerebrovascular atherosclerotic calcifications are noted.  The osseous structures are unremarkable.  IMPRESSION: No acute intracranial pathology.   Electronically Signed   By: Kathreen Devoid   On: 07/04/2014 11:55   Mr Jodene Nam Head Wo Contrast  07/04/2014   CLINICAL DATA:  Lethargy for 2 days.  Slurred speech. History of hypertension, diabetes, and previous stroke.  EXAM: MRI HEAD WITHOUT CONTRAST  MRA HEAD WITHOUT CONTRAST  TECHNIQUE: Multiplanar, multiecho pulse sequences of the brain and surrounding structures were obtained without intravenous contrast. Angiographic images of the head were obtained using MRA technique without contrast.  COMPARISON:  CT head 07/04/2014.  FINDINGS: MRI HEAD FINDINGS  There is a moderate-sized area of acute infarction affecting the RIGHT occipital lobe involving the medial cortex and subcortical white matter. Slight restricted diffusion in the adjacent corpus callosum. No contralateral, deep nuclei, or brainstem involvement.  No acute hemorrhage, mass lesion, or extra-axial fluid.  Hydrocephalus ex vacuo. Extreme cortical atrophy also affects the brainstem and cerebellum. Scattered foci of chronic infarction affect the BILATERAL parietal lobes, BILATERAL cerebellum, and deep nuclei. Tiny foci of chronic hemorrhage are evident in the RIGHT parietal lobe, within the area of chronic infarction. Prominent perivascular spaces. Flow voids are maintained. Unremarkable pituitary and cerebellar tonsils. No worrisome osseous lesions. Negative orbits. No acute sinus or mastoid fluid.  Compared with the CT earlier today, the RIGHT PCA infarct is not clearly discernible as acute.  MRA HEAD FINDINGS  The internal carotid  arteries are widely patent. The basilar artery is widely patent with both vertebrals contributing, LEFT dominant. Mild non stenotic irregularity both anterior cerebral arteries proximally. No proximal MCA stenosis. Both posterior cerebral arteries are widely patent throughout their P1 and P2 segments. The RIGHT P3 PCA segment appears moderately to severely disease. Slight irregularity distal MCA branches also noted. No intracranial aneurysm. No visible cerebellar branch occlusion.  IMPRESSION: Moderate size acute RIGHT PCA territory infarct, affecting the RIGHT occipital lobe. No evidence for hemorrhagic transformation.  Extreme atrophy with evidence for remote areas of chronic infarction.  No proximal vascular stenosis or occlusion of the major intracranial vessels.  Moderately diseased distal posterior cerebral artery on the RIGHT.   Electronically Signed   By: Rolla Flatten M.D.   On: 07/04/2014 16:10   Mr Brain Wo Contrast  07/04/2014   CLINICAL DATA:  Lethargy for 2 days. Slurred speech. History of hypertension, diabetes, and previous stroke.  EXAM: MRI HEAD WITHOUT CONTRAST  MRA HEAD WITHOUT CONTRAST  TECHNIQUE: Multiplanar, multiecho pulse sequences of the brain and surrounding structures were obtained without intravenous contrast. Angiographic images of the head were obtained using MRA technique without contrast.  COMPARISON:  CT head 07/04/2014.  FINDINGS: MRI HEAD FINDINGS  There is a moderate-sized area of acute infarction affecting the RIGHT occipital lobe involving the medial cortex and subcortical white matter. Slight restricted diffusion in the adjacent corpus callosum. No contralateral, deep nuclei, or brainstem involvement.  No acute hemorrhage, mass lesion, or extra-axial fluid.  Hydrocephalus ex vacuo. Extreme cortical atrophy also affects the brainstem and cerebellum. Scattered foci of chronic infarction affect the BILATERAL parietal lobes, BILATERAL cerebellum, and deep nuclei. Tiny foci of  chronic hemorrhage are evident in the RIGHT parietal lobe, within the area of chronic infarction. Prominent perivascular spaces. Flow voids are maintained. Unremarkable pituitary and cerebellar tonsils. No worrisome osseous lesions. Negative orbits. No acute sinus or mastoid fluid.  Compared with the CT earlier today, the RIGHT PCA infarct is not clearly discernible as acute.  MRA HEAD FINDINGS  The internal carotid arteries are widely patent. The basilar artery is widely patent with both vertebrals contributing, LEFT dominant. Mild non stenotic irregularity both anterior cerebral arteries proximally. No proximal MCA stenosis. Both posterior cerebral arteries are widely patent throughout their P1 and P2 segments. The RIGHT  P3 PCA segment appears moderately to severely disease. Slight irregularity distal MCA branches also noted. No intracranial aneurysm. No visible cerebellar branch occlusion.  IMPRESSION: Moderate size acute RIGHT PCA territory infarct, affecting the RIGHT occipital lobe. No evidence for hemorrhagic transformation.  Extreme atrophy with evidence for remote areas of chronic infarction.  No proximal vascular stenosis or occlusion of the major intracranial vessels.  Moderately diseased distal posterior cerebral artery on the RIGHT.   Electronically Signed   By: Rolla Flatten M.D.   On: 07/04/2014 16:10   Dg Foot Complete Right  06/28/2014   CLINICAL DATA:  Initial encounter for right foot laceration at the base of the right great toe after fall from wheelchair.  EXAM: RIGHT FOOT COMPLETE - 3+ VIEW  COMPARISON:  None.  FINDINGS: Mild osteopenia is present. Diabetic vascular calcifications are noted. A laceration lateral to the fifth MCP joint is noted. No acute osseous abnormality is present. Degenerative changes are noted.  IMPRESSION: 1. Soft tissue laceration. 2. No acute osseous abnormality. 3. Diabetic vascular calcifications.   Electronically Signed   By: Lawrence Santiago M.D.   On: 06/28/2014  21:31    Microbiology: No results found for this or any previous visit (from the past 240 hour(s)).   Labs: Basic Metabolic Panel:  Recent Labs Lab 07/04/14 1111 07/06/14 0539 07/07/14 0522  NA 143 137 138  K 4.6 5.7* 5.0  CL 105 102 103  CO2 24 22 23   GLUCOSE 119* 96 33*  BUN 50* 53* 51*  CREATININE 1.78* 2.09* 1.91*  CALCIUM 8.5 7.8* 8.0*   Liver Function Tests:  Recent Labs Lab 07/04/14 1111  AST 17  ALT 13  ALKPHOS 65  BILITOT 0.5  PROT 6.6  ALBUMIN 2.6*   No results found for this basename: LIPASE, AMYLASE,  in the last 168 hours No results found for this basename: AMMONIA,  in the last 168 hours CBC:  Recent Labs Lab 07/04/14 1111 07/06/14 0539  WBC 6.8 5.7  NEUTROABS 4.8  --   HGB 9.9* 9.7*  HCT 31.5* 30.6*  MCV 92.4 91.6  PLT 173 158   Cardiac Enzymes:  Recent Labs Lab 07/04/14 1128  TROPONINI <0.30   BNP: BNP (last 3 results) No results found for this basename: PROBNP,  in the last 8760 hours CBG:  Recent Labs Lab 07/06/14 1833 07/06/14 2112 07/07/14 0634 07/07/14 0747 07/07/14 1103  GLUCAP 122* 97 44* 82 87       Signed:  Shaynna Husby  [facility] 07/07/2014, 11:42 AM

## 2014-07-11 ENCOUNTER — Non-Acute Institutional Stay (SKILLED_NURSING_FACILITY): Payer: Medicare Other | Admitting: Internal Medicine

## 2014-07-11 ENCOUNTER — Encounter: Payer: Self-pay | Admitting: Internal Medicine

## 2014-07-11 DIAGNOSIS — I482 Chronic atrial fibrillation, unspecified: Secondary | ICD-10-CM

## 2014-07-11 DIAGNOSIS — D631 Anemia in chronic kidney disease: Secondary | ICD-10-CM

## 2014-07-11 DIAGNOSIS — I63431 Cerebral infarction due to embolism of right posterior cerebral artery: Secondary | ICD-10-CM

## 2014-07-11 DIAGNOSIS — J8410 Pulmonary fibrosis, unspecified: Secondary | ICD-10-CM

## 2014-07-11 DIAGNOSIS — I5032 Chronic diastolic (congestive) heart failure: Secondary | ICD-10-CM

## 2014-07-11 DIAGNOSIS — R269 Unspecified abnormalities of gait and mobility: Secondary | ICD-10-CM

## 2014-07-11 DIAGNOSIS — N184 Chronic kidney disease, stage 4 (severe): Secondary | ICD-10-CM

## 2014-07-11 DIAGNOSIS — E441 Mild protein-calorie malnutrition: Secondary | ICD-10-CM

## 2014-07-11 DIAGNOSIS — E1169 Type 2 diabetes mellitus with other specified complication: Secondary | ICD-10-CM

## 2014-07-11 DIAGNOSIS — J449 Chronic obstructive pulmonary disease, unspecified: Secondary | ICD-10-CM

## 2014-07-11 DIAGNOSIS — K279 Peptic ulcer, site unspecified, unspecified as acute or chronic, without hemorrhage or perforation: Secondary | ICD-10-CM

## 2014-07-11 NOTE — Progress Notes (Signed)
Patient ID: [name], female   DOB: [birth year], 80 y.o.   MRN: [id number]  Provider:  Rexene Edison. Mariea Clonts, D.O., C.M.D. Location:  [facility]  PCP: Cathlean Cower, MD  Code Status: full code  Allergies  Allergen Reactions  . Levofloxacin Nausea Only  . Ultram [Tramadol Hcl] Nausea Only    Chief Complaint  Patient presents with  . New Admit To SNF    new admission for rehab s/p hospitalization with new stroke    HPI: 80 y.o. female with h/o prior stroke with right sided weakness, DMII, htn, diastolic chf, depression, COPD, afib PUD, interstitial lung diseases seen for admission to SNF for rehab s/p hospitalization for acute moderate right PCA infarct involving the occipital lobe.  She had presented with slurred speech and staring into space.  CBG at home was normal, but dropped to 48 in ED.  Her oral hypoglycemic (amaryl) was stopped, and, though her glucose improved, her mental state did not.  She was treated with stroke protocol including aspirin and statin therapy.  She was later switched to eliquis due to her afib for long term treatment.  She is now here for rehab PT, OT, ST.  She is on a concho nas diet.    Apparently, the week before her hospital admission, she had a fall from her wheelchair and sustained lacerations to her feet that were repaired 06/28/14.     ROS: Review of Systems  Constitutional: Positive for malaise/fatigue. Negative for fever.  HENT: Negative for congestion.   Eyes: Negative for blurred vision.  Respiratory: Positive for shortness of breath.        Wears oxygen  Cardiovascular: Negative for chest pain.  Gastrointestinal: Negative for abdominal pain and constipation.  Genitourinary: Negative for dysuria.  Musculoskeletal: Positive for falls.       Denies pain, but staff report pain on repositioning  Skin: Negative for rash.  Neurological: Positive for weakness. Negative for headaches.       Right hemiparesis is longstanding, also had  difficulty with left arm coordination and generally did not follow commands  Psychiatric/Behavioral: Positive for depression.     Past Medical History  Diagnosis Date  . GERD 06/03/2007  . HYPERTENSION 06/03/2007  . OSTEOARTHRITIS 06/03/2007  . PEPTIC ULCER DISEASE 06/03/2007  . TRANSIENT ISCHEMIC ATTACK, HX OF 06/03/2007  . CONGESTIVE HEART FAILURE 06/03/2007    -2d echo 04/10/93 mild diastolic dysfunction, nl LA, RV  . DIABETES MELLITUS, TYPE II 06/03/2007  . LOW BACK PAIN 06/03/2007  . COPD 06/03/2007    Dr Wende Mott  . DEPRESSION 06/03/2007  . 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 Atrial fibrillation 08/10/2007  . HEMORRHOIDS 08/08/2009  . CONSTIPATION, INTERMITTENT 02/15/2010  . DYSPNEA/SHORTNESS OF BREATH 06/09/2008  . INTERSTITIAL LUNG DISEASE 08/10/2007    -onset 12/03 with definite steroid responsive component -PFT's October 11, 2009: VC 67%, no airflow obstruction DLC0 14%  . Allergic rhinitis   . Diverticulosis 05/11/2011  . Goiter 05/11/2011  . Shingles 05/11/2011   Past Surgical History  Procedure Laterality Date  . Stomach tumors removed  08/22/1991  . Abdominal hysterectomy  10/31/1983    TAH   Social History:   reports that she has never smoked. She has never used smokeless tobacco. She reports that she does not drink alcohol or use illicit drugs.  Family History  Problem Relation Age of Onset  . Hypertension Mother   . Hypertension Father   . Cancer Sister  Breast Cancer, Liver Cancer    Medications: Patient's Medications  New Prescriptions   No medications on file  Previous Medications   APIXABAN (ELIQUIS) 5 MG TABS TABLET    Take 1 tablet (5 mg total) by mouth 2 (two) times daily.   LOVASTATIN (MEVACOR) 40 MG TABLET    Take 1 tablet (40 mg total) by mouth at bedtime.   NEBIVOLOL (BYSTOLIC) 10 MG TABLET    Take 10 mg by mouth every morning.   OMEPRAZOLE (PRILOSEC) 20 MG CAPSULE    Take 20 mg by mouth every morning.   PREDNISONE (DELTASONE) 10  MG TABLET    take 1 tablet by mouth once daily   SILVER SULFADIAZINE (SILVADENE) 1 % CREAM    Apply 1 application topically daily.   TIOTROPIUM (SPIRIVA) 18 MCG INHALATION CAPSULE    Place 18 mcg into inhaler and inhale daily.  Modified Medications   No medications on file  Discontinued Medications   No medications on file   Physical Exam: Filed Vitals:   07/11/14 1043  BP: 131/64  Pulse: 73  Temp: 97.8 F (36.6 C)  Resp: 18  Height: 5\' 8"  (1.727 m)  Weight: 203 lb (92.08 kg)  SpO2: 99%   Physical Exam  Constitutional: She appears well-developed and well-nourished.  Lethargic female resting in bed  HENT:  Head: Normocephalic and atraumatic.  Right Ear: External ear normal.  Left Ear: External ear normal.  Nose: Nose normal.  Mouth/Throat: Oropharynx is clear and moist.  Eyes: Conjunctivae and EOM are normal. Pupils are equal, round, and reactive to light.  Neck: Neck supple. No JVD present.  Cardiovascular: Normal heart sounds and intact distal pulses.   irreg irreg  Pulmonary/Chest: Effort normal. No respiratory distress.  Coarse rhonchi throughout  Abdominal: Soft. Bowel sounds are normal. She exhibits no distension and no mass. There is no tenderness.  Neurological: She exhibits abnormal muscle tone.  Some aphasia and dysarthria present, right upper extremity contracture and weakness of LUE as well, unable to follow commands to move legs at all either  Psychiatric: She has a normal mood and affect.     Labs reviewed: Basic Metabolic Panel:  Recent Labs  07/04/14 1111 07/06/14 0539 07/07/14 0522  NA 143 137 138  K 4.6 5.7* 5.0  CL 105 102 103  CO2 24 22 23   GLUCOSE 119* 96 33*  BUN 50* 53* 51*  CREATININE 1.78* 2.09* 1.91*  CALCIUM 8.5 7.8* 8.0*   Liver Function Tests:  Recent Labs  03/10/14 1141 07/04/14 1111  AST 47* 17  ALT 35 13  ALKPHOS 58 65  BILITOT 0.7 0.5  PROT 6.8 6.6  ALBUMIN 3.3* 2.6*   No results found for this basename: LIPASE,  AMYLASE,  in the last 8760 hours No results found for this basename: AMMONIA,  in the last 8760 hours CBC:  Recent Labs  03/10/14 1141 07/04/14 1111 07/06/14 0539  WBC 7.0 6.8 5.7  NEUTROABS 4.1 4.8  --   HGB 11.9* 9.9* 9.7*  HCT 36.7 31.5* 30.6*  MCV 91.2 92.4 91.6  PLT 179.0 173 158   Cardiac Enzymes:  Recent Labs  07/04/14 1128  TROPONINI <0.30   BNP: No components found with this basename: POCBNP,  CBG:  Recent Labs  07/07/14 0634 07/07/14 0747 07/07/14 1103  GLUCAP 44* 82 87  07/10/14:  Wbc 6.3, h/h 9.9/30.1, plt 177; Na 143, K 5.7, BUN 41, cr 1.62  Imaging and Procedures: Reviewed in discharge summary  Assessment/Plan 1.  Cerebral infarction due to embolism of right posterior cerebral artery -here now for PT, OT, ST -cont statin, bp control and glucose control for secondary prevention as well as eliquis   2. Gait disorder -at least since prior stroke with right weakness -had a mechanical fall 1 wk before hospital admission from her wheelchair sustaining injuries to her feet  3. Chronic diastolic heart failure -had repeat echo during hospitalization  4. Chronic atrial fibrillation -cont eliquis indefinitely, rate controlled  5. INTERSTITIAL LUNG DISEASE -continues on oxygen and prednisone  -follows with pulmonary  6. Chronic obstructive pulmonary disease, unspecified COPD, unspecified chronic bronchitis type -again, on O2 and prednisone  7. Peptic ulcer disease -monitor for bleeding with addition of eliquis -cont omeprazole due to this and use of prednisone ongoing for lung disease  8. Type 2 diabetes mellitus with other specified complication -off amaryl--not currently on any meds, CBGs reviewed and range from 91 to 299 -will put on tradjenta which should not affect renal function or cause hypoglycemia -no on ace/arb due to renal function  9. CKD (chronic kidney disease), stage IV -severe, avoid nsaids and nephrotoxic agents -cr has improved  after lasix was held, but has been on this longstanding and may need to be started   10. Anemia of chronic kidney failure, stage 4 (severe) -h/h stable as above  11. Mild protein-calorie malnutrition -on concho nas diet, will monitor weights  Functional status:  Dependent in bathing, dressing  Family/ staff Communication: seen with unit supervisor  Labs/tests ordered:  Urine microalbumin

## 2014-07-18 ENCOUNTER — Telehealth: Payer: Self-pay | Admitting: Neurology

## 2014-07-18 ENCOUNTER — Non-Acute Institutional Stay (SKILLED_NURSING_FACILITY): Payer: Medicare Other | Admitting: Internal Medicine

## 2014-07-18 DIAGNOSIS — R52 Pain, unspecified: Secondary | ICD-10-CM

## 2014-07-18 DIAGNOSIS — R609 Edema, unspecified: Secondary | ICD-10-CM

## 2014-07-18 NOTE — Telephone Encounter (Signed)
Pt had to cancel her NP appt w/ Dr. Tomi Likens on 07/20/14 due to her being in a nursing home due to a stroke. Dr. Mariea Clonts, referring provider was notified.

## 2014-07-18 NOTE — Progress Notes (Signed)
Patient ID: [name], female   DOB: 1934/01/19, 80 y.o.   MRN: [id number]   Place of Service: [facility]  Allergies  Allergen Reactions  . Levofloxacin Nausea Only  . Ultram [Tramadol Hcl] Nausea Only    Code Status: Full Code Goals of Care: Longevity  Chief Complaint  Patient presents with  . Acute Visit    R hand swelling and pain with repositioning     HPI  80 y.o. female with PMH of afib, copd, CHF, HTN, HLD, among others is being seen for an acute visit at the request of nursing staff for R hand swelling and pain with repositioning. Has 4 lbs weight gain in 3 days. No additional concerns reported from patient.   Review of Systems Unable to obtain  Past Medical History  Diagnosis Date  . GERD 06/03/2007  . HYPERTENSION 06/03/2007  . OSTEOARTHRITIS 06/03/2007  . PEPTIC ULCER DISEASE 06/03/2007  . TRANSIENT ISCHEMIC ATTACK, HX OF 06/03/2007  . CONGESTIVE HEART FAILURE 06/03/2007    -2d echo 03/07/06 mild diastolic dysfunction, nl LA, RV  . DIABETES MELLITUS, TYPE II 06/03/2007  . LOW BACK PAIN 06/03/2007  . COPD 06/03/2007    Dr Wende Mott  . DEPRESSION 06/03/2007  . 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 Atrial fibrillation 08/10/2007  . HEMORRHOIDS 08/08/2009  . CONSTIPATION, INTERMITTENT 02/15/2010  . DYSPNEA/SHORTNESS OF BREATH 06/09/2008  . INTERSTITIAL LUNG DISEASE 08/10/2007    -onset 12/03 with definite steroid responsive component -PFT's October 11, 2009: VC 67%, no airflow obstruction DLC0 14%  . Allergic rhinitis   . Diverticulosis 05/11/2011  . Goiter 05/11/2011  . Shingles 05/11/2011    Past Surgical History  Procedure Laterality Date  . Stomach tumors removed  08/22/1991  . Abdominal hysterectomy  10/31/1983    TAH    History   Social History  . Marital Status: Widowed    Spouse Name: N/A    Number of Children: N/A  . Years of Education: N/A   Occupational History  . Retired    Social History Main Topics  .  Smoking status: Never Smoker   . Smokeless tobacco: Never Used  . Alcohol Use: No  . Drug Use: No  . Sexual Activity: No   Other Topics Concern  . Not on file   Social History Narrative   Children   Lives with her daughter      Medication List       This list is accurate as of: 07/18/14  3:27 PM.  Always use your most recent med list.               apixaban 5 MG Tabs tablet  Commonly known as:  ELIQUIS  Take 1 tablet (5 mg total) by mouth 2 (two) times daily.     linagliptin 5 MG Tabs tablet  Commonly known as:  TRADJENTA  Take 5 mg by mouth daily.     lovastatin 40 MG tablet  Commonly known as:  MEVACOR  Take 1 tablet (40 mg total) by mouth at bedtime.     nebivolol 10 MG tablet  Commonly known as:  BYSTOLIC  Take 10 mg by mouth every morning.     omeprazole 20 MG capsule  Commonly known as:  PRILOSEC  Take 20 mg by mouth every morning.     predniSONE 10 MG tablet  Commonly known as:  DELTASONE  take 1 tablet by mouth once daily     tiotropium 18 MCG  inhalation capsule  Commonly known as:  SPIRIVA  Place 18 mcg into inhaler and inhale daily.        Physical Exam Filed Vitals:   07/18/14 1518  BP: 112/72  Pulse: 82  Temp: 96.1 F (35.6 C)  Resp: 18   Constitutional: Obese AA elderly female in no acute distress.  HEENT: Normocephalic and atraumatic. PERRL.  Neck:  No JVD or carotid bruits. Cardiac: Normal S1, S2. RRR without appreciable murmurs, rubs, or gallops. Distal pulses intact. 1-2+ pitting leg edema bilaterally.  Lungs: No respiratory distress. Breath sounds clear bilaterally without rales, rhonchi, or wheezes. On O2 via nasal cannula Abdomen: Audible bowel sounds in all quadrants. Soft, nontender, nondistended. Musculoskeletal: Pain with passive range of motion of extremities. Trace pitting edema of R hand. Skin: Warm and dry. No rash noted. No erythema. Bruising noted on forearms.  Neurological: Alert  Psychiatric: Appropriate mood and  affect.   Assessment & Plan 1. Generalized pain Report no having pain but moaning in pain with passive ROM on exam. Will start acetaminophen 500mg  po twice daily and continue to monitor.   2. Edema Start lasix 20mg  daily x 3 days. Daily weight x 3 days and continue to monitor.    Family/Staff Communication Plan of care discuss with nursing staff. Nursing staff verbalize understanding and agree with plan of care. No additional questions or concerns reported.    Arthur Holms, MSN, AGNP-C [facility] [address] [place], [place] [address] [phone] [8am-5pm] After hours: [phone]  May need to also add potassium if she needs lasix on a regular basis in the future.    Mearl Olver L. Hakan Nudelman, D.O. [facility], [place], [place] phone [phone]):  [phone] Facility fax:  [phone] Cell Phone (Mon-Fri 8am-5pm):  [phone] On Call Provider (follow prompts after 5pm & weekends):  [phone]

## 2014-07-19 ENCOUNTER — Encounter: Payer: Self-pay | Admitting: Internal Medicine

## 2014-07-20 ENCOUNTER — Non-Acute Institutional Stay (SKILLED_NURSING_FACILITY): Payer: Medicare Other | Admitting: Internal Medicine

## 2014-07-20 ENCOUNTER — Ambulatory Visit: Payer: Medicare Other | Admitting: Neurology

## 2014-07-20 DIAGNOSIS — E1122 Type 2 diabetes mellitus with diabetic chronic kidney disease: Secondary | ICD-10-CM

## 2014-07-20 DIAGNOSIS — N189 Chronic kidney disease, unspecified: Secondary | ICD-10-CM

## 2014-07-20 DIAGNOSIS — R635 Abnormal weight gain: Secondary | ICD-10-CM

## 2014-07-20 DIAGNOSIS — I5032 Chronic diastolic (congestive) heart failure: Secondary | ICD-10-CM

## 2014-07-26 ENCOUNTER — Non-Acute Institutional Stay (SKILLED_NURSING_FACILITY): Payer: Medicare Other | Admitting: Internal Medicine

## 2014-07-26 DIAGNOSIS — I63431 Cerebral infarction due to embolism of right posterior cerebral artery: Secondary | ICD-10-CM

## 2014-07-29 NOTE — Progress Notes (Signed)
Patient ID: [name], female   DOB: [birth year], 80 y.o.   MRN: [id number]    Facility: [facility]  Chief Complaint  Patient presents with  . Acute Visit    weight gain of 10lbs   Allergies  Allergen Reactions  . Levofloxacin Nausea Only  . Ultram [Tramadol Hcl] Nausea Only   HPI 80 y/o female patient is seen today for acute concerns. She has had 10 lbs weight gain since 07/13/14.  She has h/o CVA with right sided weakness, DMII, diastolic CHF, afib, ILD among others and is here for STR post recent hospitalization for acute moderate right PCA infarct involving the occipital lobe.   She is complaint with her diet and fluid restriction as per staff. She is on continuous oxygen by nasal canula. She denies any concerns today  ROS Positive for feeling tired easily Denies chest congestion. Positive for ongoing dyspnea and is on o2 Denies chest pain Denies abdominal pain, dysuria or constipation No falls in facility Positive for weakness  Past Medical History  Diagnosis Date  . GERD 06/03/2007  . HYPERTENSION 06/03/2007  . OSTEOARTHRITIS 06/03/2007  . PEPTIC ULCER DISEASE 06/03/2007  . TRANSIENT ISCHEMIC ATTACK, HX OF 06/03/2007  . CONGESTIVE HEART FAILURE 06/03/2007    -2d echo 7/51/02 mild diastolic dysfunction, nl LA, RV  . DIABETES MELLITUS, TYPE II 06/03/2007  . LOW BACK PAIN 06/03/2007  . COPD 06/03/2007    Dr Wende Mott  . DEPRESSION 06/03/2007  . 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 Atrial fibrillation 08/10/2007  . HEMORRHOIDS 08/08/2009  . CONSTIPATION, INTERMITTENT 02/15/2010  . DYSPNEA/SHORTNESS OF BREATH 06/09/2008  . INTERSTITIAL LUNG DISEASE 08/10/2007    -onset 12/03 with definite steroid responsive component -PFT's October 11, 2009: VC 67%, no airflow obstruction DLC0 14%  . Allergic rhinitis   . Diverticulosis 05/11/2011  . Goiter 05/11/2011  . Shingles 05/11/2011   Medication reviewed. See [facility]  Physical exam BP 142/86 mmHg   Pulse 82  Temp(Src) 98 F (36.7 C)  Resp 18  Wt 217 lb (98.431 kg)  SpO2 96%  Constitutional: She appears well-developed and well-nourished. Obese and in no acute distress HENT:   Head: Normocephalic and atraumatic.  Mouth/Throat: Oropharynx is clear and moist.   Neck: Neck supple. No JVD present.  Cardiovascular: Normal heart sounds, irregular heart rate and intact distal pulses.   Pulmonary/Chest: poor air entry throughout, no wheeze or crackles but has rhonchi. No use of accessory muscles. On o2 Abdominal: Soft. Bowel sounds are normal. She exhibits no distension and no mass. There is no tenderness.  Neurological: aphasia with dysarthria, right upper extremity contracture, weakness in right UE > Left UE. Leg edema 2+ and UE has 1+ edema as well   Psychiatric: She has a normal mood and affect.   Labs 07/10/14 Wbc 6.3, hb 9.9, hct 30.1, plt 177, na 143, k 5.7, bun 41, cr 1.62, ca 7.9, glu 104  Assessment/plan  Weight gain Has hx of chf and DM  And is also on chronic steroid. All of these could be contributing to the weight gain. Given her edema, likely from fluid retention. Will change her lasix to 40 mg bid x 5 days and then 40 mg daily. Check cmp on 07/24/14 and consider kcl supplement if needed.   CHF Has poor air entry and weight gain with hx of diastolic chf. Have increased dose of diuresis - f/u on cmp and proBNP next lab.   DM On tradjenta  at present, monitor cbg and check a1c next lab to assess further

## 2014-07-31 ENCOUNTER — Telehealth: Payer: Self-pay | Admitting: Internal Medicine

## 2014-07-31 NOTE — Progress Notes (Addendum)
Patient ID: [name], female   DOB: 22-Feb-1934, 80 y.o.   MRN: [id number]               PROGRESS NOTE  DATE:  07/26/2014    FACILITY: [facility]    LEVEL OF CARE:   SNF   Acute Visit/Discharge Visit      CHIEF COMPLAINT:  Pre-discharge review.     HISTORY OF PRESENT ILLNESS:  [name] is a lady who came to [place] in October after suffering a moderately large size acute infarct involving the right occipital lobe and the medial cortex and subcortical white matter.  She also has very extensive prior stroke damage with cortical atrophy, scattered foci of chronic infarction involving the bilateral parietal lobes, bilateral cerebellum, and deep nuclei.    The patient is leaving the facility today.  She apparently did not make much progress in therapy, with the family saying they were doing as much for her at home as they are doing here.  She is a Merchandiser, retail.  She cannot ambulate.    PHYSICAL EXAMINATION:   EYES:  Reviewing her at the bedside shows significant visual acuity loss, especially in the right eye.  She does not appear to have a field cut though, which is a bit surprising given the location of her acute stroke.   NEUROLOGICAL:    SENSATION/STRENGTH:  She maybe has a flicker of movement in her right hand.  The left arm does move somewhat.  She has very limited movement in her bilateral legs.    ASSESSMENT/PLAN:  Widespread neurologic damage.  Presumably all stroke-related.    This is a very disabled woman.  She apparently has a lot of her DME at home, but the wheelchair is not big enough.  Therefore, we will provide this.  She will need PT, OT, ST, and skilled nursing at home.

## 2014-07-31 NOTE — Telephone Encounter (Signed)
[facility] calling to get orders from Dr Jenny Reichmann, verbal order for nursing twice x 4 wks, and 1xs week times 4 wks.  [place]

## 2014-08-01 ENCOUNTER — Ambulatory Visit: Payer: Medicare Other | Admitting: Internal Medicine

## 2014-08-01 NOTE — Telephone Encounter (Signed)
Ok for verbal 

## 2014-08-01 NOTE — Telephone Encounter (Signed)
[facility] Go informed of verbal ok.

## 2014-08-03 ENCOUNTER — Telehealth: Payer: Self-pay | Admitting: *Deleted

## 2014-08-03 NOTE — Telephone Encounter (Signed)
Left msg on triage stating was out seeing pt and she has 2 plus edema noted on both her her arms & bi-lateral extremities. She denies SOB, but she doesn't have any lasix. Requesting md recommendations...Johny Chess

## 2014-08-08 ENCOUNTER — Telehealth: Payer: Self-pay | Admitting: *Deleted

## 2014-08-08 ENCOUNTER — Telehealth: Payer: Self-pay

## 2014-08-08 NOTE — Telephone Encounter (Signed)
[facility] Go called to report patient has bilateral upper extremety edema +2.   They need a verbal for a social worker to come out to speak to the family.  Call back number [phone]

## 2014-08-08 NOTE — Telephone Encounter (Signed)
Connie from home care called office requesting OT 1x weekly x4 week. Also requesting scripts be written and fax to [phone]. Scripts for bilateral resting hand splint and reclining high back wheel chair. Please advise?

## 2014-08-08 NOTE — Telephone Encounter (Signed)
Ok for OT  rx Done hardcopy to [employer]

## 2014-08-08 NOTE — Telephone Encounter (Signed)
Needs OV, pt not seen by me since June 2015

## 2014-08-08 NOTE — Telephone Encounter (Signed)
Called pt spoke with son Janace Litten) gave him md response. Son stated they have appt already schedule for next Wednesday. They are not able to move appt up because he stated that was the only time his sister has off from work. Advise son to keep giving meds as rx, and if she develop SOB, or pain she will need to go to Er for evaluation. He stated the nurse come back out on thurs...Johny Chess

## 2014-08-08 NOTE — Telephone Encounter (Signed)
Ok for social worker

## 2014-08-08 NOTE — Telephone Encounter (Signed)
[facility]N informed 

## 2014-08-09 NOTE — Telephone Encounter (Signed)
Called [facility] informed of PCP ok, also faxed to number below script for request.

## 2014-08-15 ENCOUNTER — Encounter (HOSPITAL_COMMUNITY): Payer: Self-pay | Admitting: Emergency Medicine

## 2014-08-15 ENCOUNTER — Telehealth: Payer: Self-pay

## 2014-08-15 ENCOUNTER — Emergency Department (HOSPITAL_COMMUNITY): Payer: Medicare Other

## 2014-08-15 ENCOUNTER — Inpatient Hospital Stay (HOSPITAL_COMMUNITY)
Admission: EM | Admit: 2014-08-15 | Discharge: 2014-08-19 | DRG: 064 | Disposition: A | Discharge status: Hospice | Payer: Medicare Other | Attending: Internal Medicine | Admitting: Internal Medicine

## 2014-08-15 DIAGNOSIS — F22 Delusional disorders: Secondary | ICD-10-CM

## 2014-08-15 DIAGNOSIS — Z7401 Bed confinement status: Secondary | ICD-10-CM | POA: Diagnosis not present

## 2014-08-15 DIAGNOSIS — Z7901 Long term (current) use of anticoagulants: Secondary | ICD-10-CM | POA: Diagnosis not present

## 2014-08-15 DIAGNOSIS — E8809 Other disorders of plasma-protein metabolism, not elsewhere classified: Secondary | ICD-10-CM | POA: Diagnosis present

## 2014-08-15 DIAGNOSIS — I4891 Unspecified atrial fibrillation: Secondary | ICD-10-CM | POA: Diagnosis present

## 2014-08-15 DIAGNOSIS — N184 Chronic kidney disease, stage 4 (severe): Secondary | ICD-10-CM | POA: Diagnosis present

## 2014-08-15 DIAGNOSIS — K219 Gastro-esophageal reflux disease without esophagitis: Secondary | ICD-10-CM | POA: Diagnosis present

## 2014-08-15 DIAGNOSIS — Z66 Do not resuscitate: Secondary | ICD-10-CM | POA: Diagnosis present

## 2014-08-15 DIAGNOSIS — I9589 Other hypotension: Secondary | ICD-10-CM

## 2014-08-15 DIAGNOSIS — E669 Obesity, unspecified: Secondary | ICD-10-CM | POA: Diagnosis present

## 2014-08-15 DIAGNOSIS — J849 Interstitial pulmonary disease, unspecified: Secondary | ICD-10-CM | POA: Diagnosis present

## 2014-08-15 DIAGNOSIS — D696 Thrombocytopenia, unspecified: Secondary | ICD-10-CM | POA: Diagnosis present

## 2014-08-15 DIAGNOSIS — Z6836 Body mass index (BMI) 36.0-36.9, adult: Secondary | ICD-10-CM

## 2014-08-15 DIAGNOSIS — N390 Urinary tract infection, site not specified: Secondary | ICD-10-CM | POA: Diagnosis present

## 2014-08-15 DIAGNOSIS — D631 Anemia in chronic kidney disease: Secondary | ICD-10-CM | POA: Diagnosis present

## 2014-08-15 DIAGNOSIS — I5033 Acute on chronic diastolic (congestive) heart failure: Secondary | ICD-10-CM | POA: Diagnosis not present

## 2014-08-15 DIAGNOSIS — I129 Hypertensive chronic kidney disease with stage 1 through stage 4 chronic kidney disease, or unspecified chronic kidney disease: Secondary | ICD-10-CM | POA: Diagnosis present

## 2014-08-15 DIAGNOSIS — R4182 Altered mental status, unspecified: Secondary | ICD-10-CM | POA: Diagnosis present

## 2014-08-15 DIAGNOSIS — I63431 Cerebral infarction due to embolism of right posterior cerebral artery: Secondary | ICD-10-CM

## 2014-08-15 DIAGNOSIS — I5032 Chronic diastolic (congestive) heart failure: Secondary | ICD-10-CM | POA: Diagnosis present

## 2014-08-15 DIAGNOSIS — J449 Chronic obstructive pulmonary disease, unspecified: Secondary | ICD-10-CM | POA: Diagnosis present

## 2014-08-15 DIAGNOSIS — Z8673 Personal history of transient ischemic attack (TIA), and cerebral infarction without residual deficits: Secondary | ICD-10-CM | POA: Diagnosis not present

## 2014-08-15 DIAGNOSIS — E119 Type 2 diabetes mellitus without complications: Secondary | ICD-10-CM

## 2014-08-15 DIAGNOSIS — Z7952 Long term (current) use of systemic steroids: Secondary | ICD-10-CM

## 2014-08-15 DIAGNOSIS — I6340 Cerebral infarction due to embolism of unspecified cerebral artery: Secondary | ICD-10-CM | POA: Diagnosis not present

## 2014-08-15 DIAGNOSIS — G9340 Encephalopathy, unspecified: Secondary | ICD-10-CM | POA: Diagnosis present

## 2014-08-15 DIAGNOSIS — E785 Hyperlipidemia, unspecified: Secondary | ICD-10-CM | POA: Diagnosis present

## 2014-08-15 DIAGNOSIS — I248 Other forms of acute ischemic heart disease: Secondary | ICD-10-CM | POA: Diagnosis present

## 2014-08-15 DIAGNOSIS — A419 Sepsis, unspecified organism: Secondary | ICD-10-CM | POA: Diagnosis present

## 2014-08-15 DIAGNOSIS — R0602 Shortness of breath: Secondary | ICD-10-CM

## 2014-08-15 LAB — CBC WITH DIFFERENTIAL/PLATELET
BASOS ABS: 0 10*3/uL (ref 0.0–0.1)
Basophils Relative: 0 % (ref 0–1)
Eosinophils Absolute: 0 10*3/uL (ref 0.0–0.7)
Eosinophils Relative: 0 % (ref 0–5)
HCT: 30.9 % — ABNORMAL LOW (ref 36.0–46.0)
Hemoglobin: 9.5 g/dL — ABNORMAL LOW (ref 12.0–15.0)
Lymphocytes Relative: 18 % (ref 12–46)
Lymphs Abs: 1.5 10*3/uL (ref 0.7–4.0)
MCH: 29.2 pg (ref 26.0–34.0)
MCHC: 30.7 g/dL (ref 30.0–36.0)
MCV: 95.1 fL (ref 78.0–100.0)
Monocytes Absolute: 0.4 10*3/uL (ref 0.1–1.0)
Monocytes Relative: 5 % (ref 3–12)
Neutro Abs: 6.4 10*3/uL (ref 1.7–7.7)
Neutrophils Relative %: 76 % (ref 43–77)
PLATELETS: 107 10*3/uL — AB (ref 150–400)
RBC: 3.25 MIL/uL — ABNORMAL LOW (ref 3.87–5.11)
RDW: 16 % — ABNORMAL HIGH (ref 11.5–15.5)
WBC: 8.3 10*3/uL (ref 4.0–10.5)

## 2014-08-15 LAB — COMPREHENSIVE METABOLIC PANEL
ALT: 12 U/L (ref 0–35)
AST: 18 U/L (ref 0–37)
Albumin: 2.2 g/dL — ABNORMAL LOW (ref 3.5–5.2)
Alkaline Phosphatase: 75 U/L (ref 39–117)
Anion gap: 13 (ref 5–15)
BILIRUBIN TOTAL: 0.5 mg/dL (ref 0.3–1.2)
BUN: 25 mg/dL — ABNORMAL HIGH (ref 6–23)
CO2: 29 meq/L (ref 19–32)
CREATININE: 1.59 mg/dL — AB (ref 0.50–1.10)
Calcium: 7.9 mg/dL — ABNORMAL LOW (ref 8.4–10.5)
Chloride: 105 mEq/L (ref 96–112)
GFR calc Af Amer: 35 mL/min — ABNORMAL LOW (ref >90)
GFR calc non Af Amer: 30 mL/min — ABNORMAL LOW (ref >90)
Glucose, Bld: 137 mg/dL — ABNORMAL HIGH (ref 70–99)
Potassium: 4 mEq/L (ref 3.7–5.3)
SODIUM: 147 meq/L (ref 137–147)
Total Protein: 5.6 g/dL — ABNORMAL LOW (ref 6.0–8.3)

## 2014-08-15 LAB — URINALYSIS, ROUTINE W REFLEX MICROSCOPIC
Glucose, UA: NEGATIVE mg/dL
Ketones, ur: 15 mg/dL — AB
Nitrite: NEGATIVE
PROTEIN: 30 mg/dL — AB
Specific Gravity, Urine: 1.02 (ref 1.005–1.030)
Urobilinogen, UA: 1 mg/dL (ref 0.0–1.0)
pH: 5 (ref 5.0–8.0)

## 2014-08-15 LAB — I-STAT CG4 LACTIC ACID, ED
Lactic Acid, Venous: 1.59 mmol/L (ref 0.5–2.2)
Lactic Acid, Venous: 1.92 mmol/L (ref 0.5–2.2)
Lactic Acid, Venous: 1 mmol/L (ref 0.5–2.2)

## 2014-08-15 LAB — I-STAT TROPONIN, ED
Troponin i, poc: 0.17 ng/mL (ref 0.00–0.08)
Troponin i, poc: 0.2 ng/mL (ref 0.00–0.08)

## 2014-08-15 LAB — URINE MICROSCOPIC-ADD ON

## 2014-08-15 LAB — GLUCOSE, CAPILLARY: Glucose-Capillary: 128 mg/dL — ABNORMAL HIGH (ref 70–99)

## 2014-08-15 LAB — TROPONIN I: Troponin I: 0.52 ng/mL (ref <0.30)

## 2014-08-15 MED ORDER — SODIUM CHLORIDE 0.9 % IV SOLN
INTRAVENOUS | Status: DC
  Administered 2014-08-15: 22:00:00 via INTRAVENOUS

## 2014-08-15 MED ORDER — TIOTROPIUM BROMIDE MONOHYDRATE 18 MCG IN CAPS
18.0000 ug | ORAL_CAPSULE | Freq: Every day | RESPIRATORY_TRACT | Status: DC
  Administered 2014-08-17: 18 ug via RESPIRATORY_TRACT
  Filled 2014-08-15: qty 5

## 2014-08-15 MED ORDER — NEBIVOLOL HCL 10 MG PO TABS
10.0000 mg | ORAL_TABLET | Freq: Every day | ORAL | Status: DC
  Filled 2014-08-15: qty 1

## 2014-08-15 MED ORDER — ASPIRIN EC 81 MG PO TBEC
81.0000 mg | DELAYED_RELEASE_TABLET | Freq: Once | ORAL | Status: AC
  Administered 2014-08-15: 81 mg via ORAL
  Filled 2014-08-15: qty 1

## 2014-08-15 MED ORDER — SENNOSIDES-DOCUSATE SODIUM 8.6-50 MG PO TABS: 1.0000 | ORAL_TABLET | Freq: Every evening | ORAL | Status: DC | PRN

## 2014-08-15 MED ORDER — DEXTROSE 5 % IV SOLN
2.0000 g | Freq: Once | INTRAVENOUS | Status: AC
  Administered 2014-08-15: 2 g via INTRAVENOUS
  Filled 2014-08-15: qty 2

## 2014-08-15 MED ORDER — SODIUM CHLORIDE 0.9 % IV BOLUS (SEPSIS)
1000.0000 mL | INTRAVENOUS | Status: DC
  Administered 2014-08-15: 1000 mL via INTRAVENOUS

## 2014-08-15 MED ORDER — PANTOPRAZOLE SODIUM 40 MG PO TBEC
40.0000 mg | DELAYED_RELEASE_TABLET | Freq: Every day | ORAL | Status: DC
  Administered 2014-08-16 – 2014-08-19 (×4): 40 mg via ORAL
  Filled 2014-08-15 (×4): qty 1

## 2014-08-15 MED ORDER — HYDROCORTISONE NA SUCCINATE PF 100 MG IJ SOLR
50.0000 mg | Freq: Four times a day (QID) | INTRAMUSCULAR | Status: AC
  Administered 2014-08-15 – 2014-08-16 (×3): 50 mg via INTRAVENOUS
  Filled 2014-08-15 (×3): qty 2

## 2014-08-15 MED ORDER — DEXTROSE 5 % IV SOLN
2.0000 g | INTRAVENOUS | Status: DC
  Administered 2014-08-16 – 2014-08-18 (×3): 2 g via INTRAVENOUS
  Filled 2014-08-15 (×4): qty 2

## 2014-08-15 MED ORDER — PREDNISONE 5 MG PO TABS
10.0000 mg | ORAL_TABLET | Freq: Every day | ORAL | Status: DC
  Administered 2014-08-17 – 2014-08-19 (×3): 10 mg via ORAL
  Filled 2014-08-15 (×3): qty 2

## 2014-08-15 MED ORDER — APIXABAN 5 MG PO TABS
5.0000 mg | ORAL_TABLET | Freq: Two times a day (BID) | ORAL | Status: DC
  Administered 2014-08-15 – 2014-08-19 (×8): 5 mg via ORAL
  Filled 2014-08-15 (×8): qty 1

## 2014-08-15 MED ORDER — STROKE: EARLY STAGES OF RECOVERY BOOK
Freq: Once | Status: AC
  Administered 2014-08-15: 22:00:00
  Filled 2014-08-15: qty 1

## 2014-08-15 MED ORDER — LIDOCAINE HCL (PF) 1 % IJ SOLN
INTRAMUSCULAR | Status: AC
  Filled 2014-08-15: qty 5

## 2014-08-15 MED ORDER — LINAGLIPTIN 5 MG PO TABS
5.0000 mg | ORAL_TABLET | Freq: Every day | ORAL | Status: DC
  Administered 2014-08-16: 5 mg via ORAL
  Filled 2014-08-15: qty 1

## 2014-08-15 MED ORDER — PRAVASTATIN SODIUM 20 MG PO TABS
20.0000 mg | ORAL_TABLET | Freq: Every day | ORAL | Status: DC
  Administered 2014-08-16 – 2014-08-18 (×3): 20 mg via ORAL
  Filled 2014-08-15 (×5): qty 1

## 2014-08-15 MED ORDER — INSULIN ASPART 100 UNIT/ML ~~LOC~~ SOLN
0.0000 [IU] | Freq: Three times a day (TID) | SUBCUTANEOUS | Status: DC
  Administered 2014-08-16: 1 [IU] via SUBCUTANEOUS
  Administered 2014-08-16: 2 [IU] via SUBCUTANEOUS
  Administered 2014-08-16: 1 [IU] via SUBCUTANEOUS
  Administered 2014-08-17 (×2): 2 [IU] via SUBCUTANEOUS
  Administered 2014-08-18 – 2014-08-19 (×3): 1 [IU] via SUBCUTANEOUS

## 2014-08-15 MED ORDER — SODIUM CHLORIDE 0.9 % IV BOLUS (SEPSIS)
1000.0000 mL | Freq: Once | INTRAVENOUS | Status: AC
  Administered 2014-08-15: 1000 mL via INTRAVENOUS

## 2014-08-15 NOTE — Telephone Encounter (Signed)
noted 

## 2014-08-15 NOTE — Progress Notes (Signed)
Patient was transferred from another floor and bedside report was given. Patient is extremely edematous with +2 pitting. Vital signs were taken and tele was placed. Physician paged for a triponin of 0.52. Patient is alert and following commands. Will continue to monitor. Abdulkarim Eberlin, Rande Brunt

## 2014-08-15 NOTE — H&P (Signed)
[facility] History and Physical  TERRILEE DUDZIK SEG:[id number] DOB: [birth year] DOA: 08/15/2014  Referring physician: ER physician. PCP: Cathlean Cower, MD   History obtained from patient's son, ER physician and previous records.  Chief Complaint: Staring spells and confusion.  HPI: [name] is a 80 y.o. female with history of recent CVA last month was found to have staring spells in the nursing home with confusion and slurred speech by patient's son. As per the son patient staring spell was found to be around 9 AM and lasted till afternoon around 2 PM and was brought to the ER later. CT head did not show anything acute. As per the patient's son after patient came to the ER patient's staring spell resolved. Patient was found to be hypotensive and was given 2 L normal saline bolus and UA showing features consistent with UTI and was treated for possible sepsis. Chest x-ray was showing chronic changes with possible added infiltrates. Patient on my exam is become more alert awake oriented. Patient is not move her lower extremities and patient's son states it's not new. Patient has poor vision. Patient denies any chest pain shortness of breath nausea vomiting abdominal pain diarrhea.   Review of Systems: As presented in the history of presenting illness, rest negative.  Past Medical History  Diagnosis Date  . GERD 06/03/2007  . HYPERTENSION 06/03/2007  . OSTEOARTHRITIS 06/03/2007  . PEPTIC ULCER DISEASE 06/03/2007  . TRANSIENT ISCHEMIC ATTACK, HX OF 06/03/2007  . CONGESTIVE HEART FAILURE 06/03/2007    -2d echo 7/37/10 mild diastolic dysfunction, nl LA, RV  . DIABETES MELLITUS, TYPE II 06/03/2007  . LOW BACK PAIN 06/03/2007  . COPD 06/03/2007    Dr Wende Mott  . DEPRESSION 06/03/2007  . 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 Atrial fibrillation 08/10/2007  . HEMORRHOIDS 08/08/2009  . CONSTIPATION, INTERMITTENT 02/15/2010  . DYSPNEA/SHORTNESS OF BREATH 06/09/2008  . INTERSTITIAL LUNG  DISEASE 08/10/2007    -onset 12/03 with definite steroid responsive component -PFT's October 11, 2009: VC 67%, no airflow obstruction DLC0 14%  . Allergic rhinitis   . Diverticulosis 05/11/2011  . Goiter 05/11/2011  . Shingles 05/11/2011   Past Surgical History  Procedure Laterality Date  . Stomach tumors removed  08/22/1991  . Abdominal hysterectomy  10/31/1983    TAH   Social History:  reports that she has never smoked. She has never used smokeless tobacco. She reports that she does not drink alcohol or use illicit drugs. Where does patient live nursing home. Can patient participate in ADLs? No.  Allergies  Allergen Reactions  . Levofloxacin Nausea Only  . Ultram [Tramadol Hcl] Nausea Only    Family History:  Family History  Problem Relation Age of Onset  . Hypertension Mother   . Hypertension Father   . Cancer Sister     Breast Cancer, Liver Cancer      Prior to Admission medications   Medication Sig Start Date End Date Taking? Authorizing Provider  apixaban (ELIQUIS) 5 MG TABS tablet Take 1 tablet (5 mg total) by mouth 2 (two) times daily. 07/07/14  Yes Kelvin Cellar, MD  linagliptin (TRADJENTA) 5 MG TABS tablet Take 5 mg by mouth daily.   Yes Historical Provider, MD  lovastatin (MEVACOR) 40 MG tablet Take 1 tablet (40 mg total) by mouth at bedtime. 01/04/14  Yes Biagio Borg, MD  nebivolol (BYSTOLIC) 10 MG tablet Take 10 mg by mouth every morning.   Yes Historical Provider, MD  omeprazole ([place])  20 MG capsule Take 20 mg by mouth every morning.   Yes Historical Provider, MD  predniSONE (DELTASONE) 10 MG tablet take 1 tablet by mouth once daily 05/09/14  Yes Biagio Borg, MD  tiotropium (SPIRIVA) 18 MCG inhalation capsule Place 18 mcg into inhaler and inhale daily.   Yes Historical Provider, MD    Physical Exam: Filed Vitals:   08/15/14 1930 08/15/14 1950 08/15/14 2000 08/15/14 2030  BP: 110/72  126/64 119/67  Pulse: 113  82 84  Temp:  99 F (37.2 C)    TempSrc:       Resp: 12  15 13   SpO2: 100%  100% 100%     General:  Obese not in acute distress.  Eyes: Anicteric no pallor.  ENT: No discharge from ears eyes nose mouth.  Neck: No neck rigidity no mass felt.  Cardiovascular: S1-S2 heard.  Respiratory: No rhonchi or crepitations.  Abdomen: Soft nontender bowel sounds present.  Skin: No rash.  Musculoskeletal: Mild edema in the lower extremities.  Psychiatric: Patient is presently alert and awake.  Neurologic: Alert awake oriented to her name and place. Patient does not move her lower extremities which patient's family states is chronic. Moves both upper extremities. Poor vision in both eyes.  Labs on Admission:  Basic Metabolic Panel:  Recent Labs Lab 08/15/14 1632  NA 147  K 4.0  CL 105  CO2 29  GLUCOSE 137*  BUN 25*  CREATININE 1.59*  CALCIUM 7.9*   Liver Function Tests:  Recent Labs Lab 08/15/14 1632  AST 18  ALT 12  ALKPHOS 75  BILITOT 0.5  PROT 5.6*  ALBUMIN 2.2*   No results for input(s): LIPASE, AMYLASE in the last 168 hours. No results for input(s): AMMONIA in the last 168 hours. CBC:  Recent Labs Lab 08/15/14 1632  WBC 8.3  NEUTROABS 6.4  HGB 9.5*  HCT 30.9*  MCV 95.1  PLT 107*   Cardiac Enzymes: No results for input(s): CKTOTAL, CKMB, CKMBINDEX, TROPONINI in the last 168 hours.  BNP (last 3 results) No results for input(s): PROBNP in the last 8760 hours. CBG: No results for input(s): GLUCAP in the last 168 hours.  Radiological Exams on Admission: Ct Head Wo Contrast  08/15/2014   CLINICAL DATA:  Prior stroke with unknown visual deficit, Pt son reports that at [place] pt had a "blank stare" and "seemed different" No known hx of dementia.  EXAM: CT HEAD WITHOUT CONTRAST  TECHNIQUE: Contiguous axial images were obtained from the base of the skull through the vertex without intravenous contrast.  COMPARISON:  MRI brain 07/04/2014  FINDINGS: There is no evidence of mass effect, midline shift, or  extra-axial fluid collections. There is no evidence of a space-occupying lesion or intracranial hemorrhage. There is no evidence of a cortical-based area of acute infarction. There is an old right parietal lobe infarct with encephalomalacia. There is generalized cerebral atrophy. There is periventricular white matter low attenuation likely secondary to microangiopathy.  The ventricles and sulci are appropriate for the patient's age. The basal cisterns are patent.  Visualized portions of the orbits are unremarkable. The visualized portions of the paranasal sinuses and mastoid air cells are unremarkable. Cerebrovascular atherosclerotic calcifications are noted.  The osseous structures are unremarkable.  IMPRESSION: 1. No acute intracranial pathology. 2. Chronic microvascular disease and cerebral atrophy.   Electronically Signed   By: Kathreen Devoid   On: 08/15/2014 19:21   Dg Chest Port 1 View  08/15/2014   CLINICAL DATA:  Altered mental status 1 day. Hypertension and atrial fibrillation.  EXAM: PORTABLE CHEST - 1 VIEW  COMPARISON:  07/04/2014 and 04/17/2013 as well as chest CT 04/10/2011  FINDINGS: Patient is rotated to the left. Lungs are hypoinflated with persistent bilateral coarse interstitial changes compatible with known emphysematous disease and fibrosis. There is mild increased density over the right midlung/hilar region which may be partially due to patient's rotation although cannot exclude developing infectious process. No evidence of effusion. Cardiomediastinal silhouette and remainder of the exam is unchanged.  IMPRESSION: Findings compatible patient's known emphysematous disease and fibrosis. There is slight increased density over the right hilar/ perihilar region which may be due to the degree of rotation although cannot exclude developing infectious process. Consider repeat frontal radiograph with better positioning versus PA and lateral film.   Electronically Signed   By: Marin Olp M.D.   On:  08/15/2014 16:47    EKG: Independently reviewed. Normal sinus rhythm.  Assessment/Plan Principal Problem:   Acute encephalopathy Active Problems:   Atrial fibrillation   CKD (chronic kidney disease), stage IV   Chronic diastolic heart failure   Sepsis   Diabetes mellitus type 2, controlled   1. Acute encephalopathy with staring spells - I have discussed with on-call neurologist Dr. Nicole Kindred who has advised to get MRI brain and EEG. If they are positive for any stroke or seizure-like activities and to reconsult neurology. Patient's confusion and encephalopathy could also be from possible UTI. 2. Sepsis from UTI - patient has been placed on cefepime and will add vancomycin as patient's chest x-ray showing possible infiltrates. Follow blood cultures and urine cultures. Patient did received 2 L normal seen bolus in the ER. Since patient has diastolic CHF closely monitor for any fluid overload. 3. Elevated troponin - possibly from hypotension. Cycle cardiac markers. Recent 2-D echo done last month showing EF of 06-26% with diastolic CHF. Aspirin. 4. Atrial fibrillation presently rate controlled - patient is on Apixaban which will be continued. Rate is controlled at this time. 5. History of diastolic CHF last admission was 50-55% in October 2015 last month - see #2 with regard to IV fluids. 6. COPD and history of interstitial lung disease - patient is on chronic steroids. Patient will be given stress dose steroids. I have ordered 3 doses of hydrocortisone. Continue prednisone. 7. Chronic and disease stage IV - closely following the gallbladder metabolic panel. 8. Chronic anemia probably from kidney disease - follow CBC. 9. Thrombocytopenia could be from infectious process - closely follow CBC.    Code Status: Full code.  Family Communication: Patient's son.  Disposition Plan: Admit to inpatient.    Arnulfo Batson N. [facility] Pager [phone].  If 7PM-7AM, please contact  night-coverage [email or web address] Password [email or web address] 08/15/2014, 8:45 PM

## 2014-08-15 NOTE — ED Provider Notes (Addendum)
CSN: [id number]     Arrival date & time 08/15/14  1552 History   First MD Initiated Contact with Patient 08/15/14 1558     Chief Complaint  Patient presents with  . Altered Mental Status     (Consider location/radiation/quality/duration/timing/severity/associated sxs/prior Treatment) HPI Level V caveat altered mental status history is obtained from paramedics. At 4 PM I attempted to call patient's home to get further history, no answer, no voice Mail available. EMS reports to me that patient was "staring into space" with blank stare" since 9 AM today. Patient presently offers no complaint. EMS obtained a CBG which was 2:15. Attempted IV access, without success. No other treatment prior to coming here. Past Medical History  Diagnosis Date  . GERD 06/03/2007  . HYPERTENSION 06/03/2007  . OSTEOARTHRITIS 06/03/2007  . PEPTIC ULCER DISEASE 06/03/2007  . TRANSIENT ISCHEMIC ATTACK, HX OF 06/03/2007  . CONGESTIVE HEART FAILURE 06/03/2007    -2d echo 5/46/50 mild diastolic dysfunction, nl LA, RV  . DIABETES MELLITUS, TYPE II 06/03/2007  . LOW BACK PAIN 06/03/2007  . COPD 06/03/2007    Dr Wende Mott  . DEPRESSION 06/03/2007  . 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 Atrial fibrillation 08/10/2007  . HEMORRHOIDS 08/08/2009  . CONSTIPATION, INTERMITTENT 02/15/2010  . DYSPNEA/SHORTNESS OF BREATH 06/09/2008  . INTERSTITIAL LUNG DISEASE 08/10/2007    -onset 12/03 with definite steroid responsive component -PFT's October 11, 2009: VC 67%, no airflow obstruction DLC0 14%  . Allergic rhinitis   . Diverticulosis 05/11/2011  . Goiter 05/11/2011  . Shingles 05/11/2011   Past Surgical History  Procedure Laterality Date  . Stomach tumors removed  08/22/1991  . Abdominal hysterectomy  10/31/1983    TAH   Family History  Problem Relation Age of Onset  . Hypertension Mother   . Hypertension Father   . Cancer Sister     Breast Cancer, Liver Cancer   History  Substance Use Topics  . Smoking  status: Never Smoker   . Smokeless tobacco: Never Used  . Alcohol Use: No   OB History    No data available     Review of Systems  Unable to perform ROS  altered mental status    Allergies  Levofloxacin and Ultram  Home Medications   Prior to Admission medications   Medication Sig Start Date End Date Taking? Authorizing Provider  apixaban (ELIQUIS) 5 MG TABS tablet Take 1 tablet (5 mg total) by mouth 2 (two) times daily. 07/07/14   Kelvin Cellar, MD  linagliptin (TRADJENTA) 5 MG TABS tablet Take 5 mg by mouth daily.    Historical Provider, MD  lovastatin (MEVACOR) 40 MG tablet Take 1 tablet (40 mg total) by mouth at bedtime. 01/04/14   Biagio Borg, MD  nebivolol (BYSTOLIC) 10 MG tablet Take 10 mg by mouth every morning.    Historical Provider, MD  omeprazole (PRILOSEC) 20 MG capsule Take 20 mg by mouth every morning.    Historical Provider, MD  predniSONE (DELTASONE) 10 MG tablet take 1 tablet by mouth once daily 05/09/14   Biagio Borg, MD  tiotropium (SPIRIVA) 18 MCG inhalation capsule Place 18 mcg into inhaler and inhale daily.    Historical Provider, MD   SpO2 99% Physical Exam  Constitutional:  Chronically ill-appearing  HENT:  Head: Normocephalic and atraumatic.  Mucous membranes dry  Eyes: Conjunctivae are normal. Pupils are equal, round, and reactive to light.  Neck: Neck supple. No tracheal deviation present. No thyromegaly present.  Cardiovascular:  Normal rate and regular rhythm.   No murmur heard. Pulmonary/Chest: Effort normal and breath sounds normal. No respiratory distress.  Abdominal: Soft. Bowel sounds are normal. She exhibits no distension. There is no tenderness.  Obese  Genitourinary:  No gross blood I rectum  Musculoskeletal: Normal range of motion. She exhibits edema. She exhibits no tenderness.  2+ pretibial  edema  Neurological: She is alert. Coordination normal.  Follows simple commands. No spontaneous movement of lower extremities. Oriented  to name and hospital does not know month or year. Motor strength of bilateral upper extremities 4 over 5  Skin: Skin is warm and dry. No rash noted.  Psychiatric: She has a normal mood and affect.  Nursing note and vitals reviewed.   ED Course  Procedures (including critical care time) Labs Review Labs Reviewed  URINE CULTURE  CULTURE, BLOOD (ROUTINE X 2)  CULTURE, BLOOD (ROUTINE X 2)  COMPREHENSIVE METABOLIC PANEL  CBC WITH DIFFERENTIAL  URINALYSIS, ROUTINE W REFLEX MICROSCOPIC  I-STAT CG4 LACTIC ACID, ED  I-STAT TROPOININ, ED    Imaging Review No results found.   EKG Interpretation None     4 PM patient's son arrived and provided history to me the patient's speech was slurred this morning. She was staring to awake. He will he last saw her yesterday afternoon when her speech was normal. At baseline patient is disoriented and bedbound. She has been eating well.  7:30 PM patient states she feels improved after treatment with intravenous fluids although she had no specific complaint initially. She does appear alert and is talkative.  chest x-ray viewed by me .  Results for orders placed or performed during the hospital encounter of 08/15/14  Comprehensive metabolic panel  Result Value Ref Range   Sodium 147 137 - 147 mEq/L   Potassium 4.0 3.7 - 5.3 mEq/L   Chloride 105 96 - 112 mEq/L   CO2 29 19 - 32 mEq/L   Glucose, Bld 137 (H) 70 - 99 mg/dL   BUN 25 (H) 6 - 23 mg/dL   Creatinine, Ser 1.59 (H) 0.50 - 1.10 mg/dL   Calcium 7.9 (L) 8.4 - 10.5 mg/dL   Total Protein 5.6 (L) 6.0 - 8.3 g/dL   Albumin 2.2 (L) 3.5 - 5.2 g/dL   AST 18 0 - 37 U/L   ALT 12 0 - 35 U/L   Alkaline Phosphatase 75 39 - 117 U/L   Total Bilirubin 0.5 0.3 - 1.2 mg/dL   GFR calc non Af Amer 30 (L) >90 mL/min   GFR calc Af Amer 35 (L) >90 mL/min   Anion gap 13 5 - 15  CBC with Differential  Result Value Ref Range   WBC 8.3 4.0 - 10.5 K/uL   RBC 3.25 (L) 3.87 - 5.11 MIL/uL   Hemoglobin 9.5 (L) 12.0 -  15.0 g/dL   HCT 30.9 (L) 36.0 - 46.0 %   MCV 95.1 78.0 - 100.0 fL   MCH 29.2 26.0 - 34.0 pg   MCHC 30.7 30.0 - 36.0 g/dL   RDW 16.0 (H) 11.5 - 15.5 %   Platelets 107 (L) 150 - 400 K/uL   Neutrophils Relative % 76 43 - 77 %   Neutro Abs 6.4 1.7 - 7.7 K/uL   Lymphocytes Relative 18 12 - 46 %   Lymphs Abs 1.5 0.7 - 4.0 K/uL   Monocytes Relative 5 3 - 12 %   Monocytes Absolute 0.4 0.1 - 1.0 K/uL   Eosinophils Relative 0 0 -  5 %   Eosinophils Absolute 0.0 0.0 - 0.7 K/uL   Basophils Relative 0 0 - 1 %   Basophils Absolute 0.0 0.0 - 0.1 K/uL  Urinalysis, Routine w reflex microscopic  Result Value Ref Range   Color, Urine YELLOW YELLOW   APPearance HAZY (A) CLEAR   Specific Gravity, Urine 1.020 1.005 - 1.030   pH 5.0 5.0 - 8.0   Glucose, UA NEGATIVE NEGATIVE mg/dL   Hgb urine dipstick SMALL (A) NEGATIVE   Bilirubin Urine SMALL (A) NEGATIVE   Ketones, ur 15 (A) NEGATIVE mg/dL   Protein, ur 30 (A) NEGATIVE mg/dL   Urobilinogen, UA 1.0 0.0 - 1.0 mg/dL   Nitrite NEGATIVE NEGATIVE   Leukocytes, UA MODERATE (A) NEGATIVE  Urine microscopic-add on  Result Value Ref Range   Squamous Epithelial / LPF FEW (A) RARE   WBC, UA 21-50 <3 WBC/hpf   RBC / HPF 0-2 <3 RBC/hpf   Bacteria, UA MANY (A) RARE   Casts HYALINE CASTS (A) NEGATIVE  I-Stat CG4 Lactic Acid, ED  Result Value Ref Range   Lactic Acid, Venous 1.59 0.5 - 2.2 mmol/L  I-stat troponin, ED  Result Value Ref Range   Troponin i, poc 0.17 (HH) 0.00 - 0.08 ng/mL   Comment NOTIFIED PHYSICIAN    Comment 3          I-Stat CG4 Lactic Acid, ED  Result Value Ref Range   Lactic Acid, Venous 1.92 0.5 - 2.2 mmol/L   Ct Head Wo Contrast  08/15/2014   CLINICAL DATA:  Prior stroke with unknown visual deficit, Pt son reports that at [place] pt had a "blank stare" and "seemed different" No known hx of dementia.  EXAM: CT HEAD WITHOUT CONTRAST  TECHNIQUE: Contiguous axial images were obtained from the base of the skull through the vertex without  intravenous contrast.  COMPARISON:  MRI brain 07/04/2014  FINDINGS: There is no evidence of mass effect, midline shift, or extra-axial fluid collections. There is no evidence of a space-occupying lesion or intracranial hemorrhage. There is no evidence of a cortical-based area of acute infarction. There is an old right parietal lobe infarct with encephalomalacia. There is generalized cerebral atrophy. There is periventricular white matter low attenuation likely secondary to microangiopathy.  The ventricles and sulci are appropriate for the patient's age. The basal cisterns are patent.  Visualized portions of the orbits are unremarkable. The visualized portions of the paranasal sinuses and mastoid air cells are unremarkable. Cerebrovascular atherosclerotic calcifications are noted.  The osseous structures are unremarkable.  IMPRESSION: 1. No acute intracranial pathology. 2. Chronic microvascular disease and cerebral atrophy.   Electronically Signed   By: Kathreen Devoid   On: 08/15/2014 19:21   Dg Chest Port 1 View  08/15/2014   CLINICAL DATA:  Altered mental status 1 day. Hypertension and atrial fibrillation.  EXAM: PORTABLE CHEST - 1 VIEW  COMPARISON:  07/04/2014 and 04/17/2013 as well as chest CT 04/10/2011  FINDINGS: Patient is rotated to the left. Lungs are hypoinflated with persistent bilateral coarse interstitial changes compatible with known emphysematous disease and fibrosis. There is mild increased density over the right midlung/hilar region which may be partially due to patient's rotation although cannot exclude developing infectious process. No evidence of effusion. Cardiomediastinal silhouette and remainder of the exam is unchanged.  IMPRESSION: Findings compatible patient's known emphysematous disease and fibrosis. There is slight increased density over the right hilar/ perihilar region which may be due to the degree of rotation although cannot exclude  developing infectious process. Consider repeat  frontal radiograph with better positioning versus PA and lateral film.   Electronically Signed   By: Marin Olp M.D.   On: 08/15/2014 16:47     MDM  Code sepsis called based on altered mental status and hypotension and source of infection being urinary tract. Sirs criteria include pulse and blood pressure Final diagnoses:  "Walking corpse" syndrome  Altered mental status   Elevated troponin likely secondary to sepsis. Patient suffering secondary hypotension due to sepsis Spoke with Dr.Kakrakandy plan admit step down unit, iv antibiotics . Fluid resuscitation, cultures are pending Diagnosis #1 sepsis #2 urinary tract infection #3 chronic renal insufficiency #4 chronic anemia #5 thrombocytopenia #6 hyperglycemia CRITICAL CARE Performed by: Orlie Dakin Total critical care time: 30 minute Critical care time was exclusive of separately billable procedures and treating other patients. Critical care was necessary to treat or prevent imminent or life-threatening deterioration. Critical care was time spent personally by me on the following activities: development of treatment plan with patient and/or surrogate as well as nursing, discussions with consultants, evaluation of patient's response to treatment, examination of patient, obtaining history from patient or surrogate, ordering and performing treatments and interventions, ordering and review of laboratory studies, ordering and review of radiographic studies, pulse oximetry and re-evaluation of patient's condition.     Orlie Dakin, MD 08/15/14 [place], MD 08/15/14 0626

## 2014-08-15 NOTE — ED Notes (Signed)
Per EMS- pt from home, lives with son. Pt has hx of previous stroke with unknown visual deficits. Pt son reports that at [place] pt had a "blank stare" and "seemed different" No known hx of dementia. Son reports that pt has been urinating normally, but has not had a BM in "a while." Pt denies pain at this time. NAD noted.

## 2014-08-15 NOTE — Telephone Encounter (Signed)
[name]) called to inform the patient has declined, edema has increased and appetite decreased.  The family will being her in for appt. On Wed.  They would like to discuss [place] if needed at this time.  Arville Go has been taking care of this patient and if PCP thinks hospice would be more helpful at this time or to continue [place] care they need to know.  Please after seeing this patient on Wednesday let them know the best care for this patient. Call back number for [place] [phone]

## 2014-08-15 NOTE — ED Notes (Signed)
NOTIFIED DR. Winfred Leeds IN PERSON FOR PATIENTS LAB RESULTS OF I-STST TROPONIN= 0.17 ng/ml , 2 16:48 pm ,08/15/2014.

## 2014-08-15 NOTE — Progress Notes (Signed)
ANTIBIOTIC CONSULT NOTE - INITIAL  Pharmacy Consult for Cefepime Indication: sepsis  Allergies  Allergen Reactions  . Levofloxacin Nausea Only  . Ultram [Tramadol Hcl] Nausea Only    Patient Measurements:   Vital Signs: Temp: 99 F (37.2 C) (11/24 1714) Temp Source: Rectal (11/24 1714) BP: 113/73 mmHg (11/24 1815) Pulse Rate: 114 (11/24 1815) Intake/Output from previous day:   Intake/Output from this shift:    Labs:  Recent Labs  08/15/14 1632  WBC 8.3  HGB 9.5*  PLT 107*  CREATININE 1.59*   CrCl cannot be calculated (Unknown ideal weight.). No results for input(s): VANCOTROUGH, VANCOPEAK, VANCORANDOM, GENTTROUGH, GENTPEAK, GENTRANDOM, TOBRATROUGH, TOBRAPEAK, TOBRARND, AMIKACINPEAK, AMIKACINTROU, AMIKACIN in the last 72 hours.   Microbiology: No results found for this or any previous visit (from the past 720 hour(s)).  Medical History: Past Medical History  Diagnosis Date  . GERD 06/03/2007  . HYPERTENSION 06/03/2007  . OSTEOARTHRITIS 06/03/2007  . PEPTIC ULCER DISEASE 06/03/2007  . TRANSIENT ISCHEMIC ATTACK, HX OF 06/03/2007  . CONGESTIVE HEART FAILURE 06/03/2007    -2d echo 9/67/89 mild diastolic dysfunction, nl LA, RV  . DIABETES MELLITUS, TYPE II 06/03/2007  . LOW BACK PAIN 06/03/2007  . COPD 06/03/2007    Dr Wende Mott  . DEPRESSION 06/03/2007  . 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 Atrial fibrillation 08/10/2007  . HEMORRHOIDS 08/08/2009  . CONSTIPATION, INTERMITTENT 02/15/2010  . DYSPNEA/SHORTNESS OF BREATH 06/09/2008  . INTERSTITIAL LUNG DISEASE 08/10/2007    -onset 12/03 with definite steroid responsive component -PFT's October 11, 2009: VC 67%, no airflow obstruction DLC0 14%  . Allergic rhinitis   . Diverticulosis 05/11/2011  . Goiter 05/11/2011  . Shingles 05/11/2011    Medications:  Scheduled:  . lidocaine (PF)       Assessment: 80 yo f who presented to the ED on 11/24 for a reported episode at home with blank stares.  Patient has  a h/o of stroke.  Pharmacy is consulted to begin cefepime for urosepsis.  Patient has already received cefepime 2 gm IV x 1 in the ED.  Wbc 8.3, tmax 99, SCr 1.59, CrCl ~45 ml/min.  Goal of Therapy:  Eradication of infection  Plan:  Cefepime 2 gm IV q24hrs Monitor renal function and need for adjustments in dosing Monitor CBC, temperature curve, clinical course  Cassie L. Nicole Kindred, PharmD Clinical Pharmacy Resident Pager: [phone] 08/15/2014 7:51 PM

## 2014-08-16 ENCOUNTER — Inpatient Hospital Stay (HOSPITAL_COMMUNITY): Payer: Medicare Other

## 2014-08-16 ENCOUNTER — Ambulatory Visit: Payer: Medicare Other | Admitting: Internal Medicine

## 2014-08-16 DIAGNOSIS — R609 Edema, unspecified: Secondary | ICD-10-CM

## 2014-08-16 DIAGNOSIS — I6931 Cognitive deficits following cerebral infarction: Secondary | ICD-10-CM

## 2014-08-16 DIAGNOSIS — I5031 Acute diastolic (congestive) heart failure: Secondary | ICD-10-CM

## 2014-08-16 DIAGNOSIS — I6340 Cerebral infarction due to embolism of unspecified cerebral artery: Principal | ICD-10-CM | POA: Insufficient documentation

## 2014-08-16 DIAGNOSIS — R1310 Dysphagia, unspecified: Secondary | ICD-10-CM

## 2014-08-16 DIAGNOSIS — I69351 Hemiplegia and hemiparesis following cerebral infarction affecting right dominant side: Secondary | ICD-10-CM

## 2014-08-16 DIAGNOSIS — I69354 Hemiplegia and hemiparesis following cerebral infarction affecting left non-dominant side: Secondary | ICD-10-CM

## 2014-08-16 DIAGNOSIS — I480 Paroxysmal atrial fibrillation: Secondary | ICD-10-CM

## 2014-08-16 DIAGNOSIS — N184 Chronic kidney disease, stage 4 (severe): Secondary | ICD-10-CM

## 2014-08-16 DIAGNOSIS — I69391 Dysphagia following cerebral infarction: Secondary | ICD-10-CM

## 2014-08-16 DIAGNOSIS — E119 Type 2 diabetes mellitus without complications: Secondary | ICD-10-CM

## 2014-08-16 LAB — URINE CULTURE: Colony Count: >=100000

## 2014-08-16 LAB — GLUCOSE, CAPILLARY
GLUCOSE-CAPILLARY: 131 mg/dL — AB (ref 70–99)
GLUCOSE-CAPILLARY: 136 mg/dL — AB (ref 70–99)
Glucose-Capillary: 143 mg/dL — ABNORMAL HIGH (ref 70–99)
Glucose-Capillary: 168 mg/dL — ABNORMAL HIGH (ref 70–99)
Glucose-Capillary: 146 mg/dL — ABNORMAL HIGH (ref 70–99)

## 2014-08-16 LAB — TROPONIN I: Troponin I: 0.43 ng/mL (ref <0.30)

## 2014-08-16 MED ORDER — FUROSEMIDE 10 MG/ML IJ SOLN
40.0000 mg | Freq: Two times a day (BID) | INTRAMUSCULAR | Status: DC
  Administered 2014-08-16 – 2014-08-17 (×2): 40 mg via INTRAVENOUS
  Filled 2014-08-16 (×2): qty 4

## 2014-08-16 MED ORDER — NEBIVOLOL HCL 10 MG PO TABS: 10.0000 mg | ORAL_TABLET | Freq: Every day | ORAL | Status: DC

## 2014-08-16 MED ORDER — POTASSIUM CHLORIDE CRYS ER 20 MEQ PO TBCR
40.0000 meq | EXTENDED_RELEASE_TABLET | Freq: Every day | ORAL | Status: DC
  Administered 2014-08-16 – 2014-08-19 (×4): 40 meq via ORAL
  Filled 2014-08-16 (×4): qty 2

## 2014-08-16 MED ORDER — VANCOMYCIN HCL 10 G IV SOLR
1250.0000 mg | INTRAVENOUS | Status: DC
  Administered 2014-08-16: 1250 mg via INTRAVENOUS
  Filled 2014-08-16 (×2): qty 1250

## 2014-08-16 NOTE — Procedures (Signed)
ELECTROENCEPHALOGRAM REPORT   Patient: [name]       Room #: 1U38 EEG No. ID: [id number] Age: 80 y.o.        Sex: female Referring Physician: Ghimire Report Date:  08/16/2014        Interpreting Physician: Alexis Goodell D  History: [name] is an 80 y.o. female with history of a stroke in the past month who now presents with prolonged starring spells.    Medications:  Scheduled: . apixaban  5 mg Oral BID  . ceFEPime (MAXIPIME) IV  2 g Intravenous Q24H  . insulin aspart  0-9 Units Subcutaneous TID WC  . linagliptin  5 mg Oral Daily  . pantoprazole  40 mg Oral Daily  . pravastatin  20 mg Oral q1800  . [START ON 08/17/2014] predniSONE  10 mg Oral QAC breakfast  . tiotropium  18 mcg Inhalation Daily  . vancomycin  1,250 mg Intravenous Q24H    Conditions of Recording:  This is a 16 channel EEG carried out with the patient in the lethargic state.  Description:  The background activity consists of a low voltage, polymorphic delta activity that is diffusely distributed.  There is superimposed artifact noted as well.   This activity is continuous.  No epileptiform activity is noted.  No normal sleep transients are noted.   Hyperventilation and intermittent photic stimulation were not performed.   IMPRESSION: This is an abnormal EEG secondary to general background slowing.  This finding may be seen with a diffuse disturbance that is etiologically nonspecific, but may include a metabolic encephalopathy, among other possibilities.  No epileptiform activity was noted.     Alexis Goodell, MD [facility] [phone] 08/16/2014, 2:59 PM

## 2014-08-16 NOTE — Progress Notes (Signed)
EEG Completed; Results Pending  

## 2014-08-16 NOTE — Progress Notes (Signed)
PATIENT DETAILS Name: [name] Age: 79 y.o. Sex: female Date of Birth: 1934/06/06 Admit Date: 08/15/2014 Admitting Physician Rise Patience, MD STM:[id number] Jenny Reichmann, MD  Subjective: Awake, somewhat alert-suspect dysarthric at baseline-very difficult to discern speech.  Assessment/Plan: Principal Problem:   Acute encephalopathy:secondary to acute CVA and UTI. EEG neg. Suspect back to baseline.  Active Problems:   Acute CVA: embolic given multiple areas involved in MRI Brain. Recent Echo/Carotids done-will not repeat. Already on Eliquis, suspect getting a TEE would not change this management. On ASA as well-given overall frailty-suspect does not need both ASA and Eliquis. Will stop ASA.Very frail, non ambulatory-has what appears b/l upper ext flexion contractures-with some minimal movement, and chronic b/l lower ext weakness (0/5).    Atrial fibrillation: currently appears to be sinus. Continue Eliquis.    CKD (chronic kidney disease), stage WL:NLGXQJJHER close to usual baseline. Monitor lytes periodically.    Chronic diastolic heart failure:appears to volume overloaded. Start IV Lasix, strict intake output, daily weights.Place     Minimally Elevated troponin: suspect demand ischemia. Not a candidate for further work up.    Sepsis with Hypotension:Secondary to UTI, BP now stable. Stop Hydrocortisone and IVF.    DEY:CXKGYJEH, looks non toxic. Await Urine culture. Continue IV Cefepime.Stop IV Vanco    Diabetes mellitus type 2, controlled:CBG's controlled with SSI. Stop all oral hypoglycemic agents.    Palliative Care:very poor overall prognoses. Bed bound, poor quality of life. Spoke with [name] UDJS [phone]-famil;y interested in home hospice care. I spoke to her about DNR, explained that she is not a candidate for aggressive care, Ms Siess agreed with DNR order.   Disposition: Remain inpatient-Home with Hospice on discharge.  Antibiotics:  See  below.   Anti-infectives    Start     Dose/Rate Route Frequency Ordered Stop   08/16/14 2000  ceFEPIme (MAXIPIME) 2 g in dextrose 5 % 50 mL IVPB     2 g100 mL/hr over 30 Minutes Intravenous Every 24 hours 08/15/14 1952     08/16/14 0400  vancomycin (VANCOCIN) 1,250 mg in sodium chloride 0.9 % 250 mL IVPB     1,250 mg166.7 mL/hr over 90 Minutes Intravenous Every 24 hours 08/16/14 0326     08/15/14 1930  ceFEPIme (MAXIPIME) 2 g in dextrose 5 % 50 mL IVPB     2 g100 mL/hr over 30 Minutes Intravenous  Once 08/15/14 1925 08/15/14 2049      DVT Prophylaxis: Eliquis  Code Status:  DNR-spoke with daughter and explained concept of DNR-agreeable.  Family Communication Daughter-see above  Procedures:  None  CONSULTS:  neurology  Time spent 40 minutes-which includes 50% of the time with face-to-face with patient/ family and coordinating care related to the above assessment and plan.  MEDICATIONS: Scheduled Meds: . apixaban  5 mg Oral BID  . ceFEPime (MAXIPIME) IV  2 g Intravenous Q24H  . insulin aspart  0-9 Units Subcutaneous TID WC  . linagliptin  5 mg Oral Daily  . pantoprazole  40 mg Oral Daily  . pravastatin  20 mg Oral q1800  . [START ON 08/17/2014] predniSONE  10 mg Oral QAC breakfast  . tiotropium  18 mcg Inhalation Daily  . vancomycin  1,250 mg Intravenous Q24H   Continuous Infusions:  PRN Meds:.senna-docusate    PHYSICAL EXAM: Vital signs in last 24 hours: Filed Vitals:   08/16/14 0344 08/16/14 0545 08/16/14 1030 08/16/14 1444  BP: 124/75 108/60 117/52  130/61  Pulse: 75 73 73 75  Temp: 97.3 F (36.3 C) 98.2 F (36.8 C) 98.1 F (36.7 C) 98.1 F (36.7 C)  TempSrc: Oral Axillary Oral Oral  Resp: 16 16 17 16   Height:      Weight:      SpO2: 100% 100% 100% 100%    Weight change:  Filed Weights   08/15/14 2125  Weight: 100.789 kg (222 lb 3.2 oz)   Body mass index is 35.88 kg/(m^2).   Gen Exam: Awake, dysarthric, follows some commands    Neck: Supple,  No JVD.   Chest: B/L Clear.   CVS: S1 S2 Regular, no murmurs.  Abdomen: soft, BS +, non tender, non distended.  Extremities: 3+ edema, lower extremities warm to touch. NeurologicB/L Lower ext 0/5, B/L Upper ext 2-3/5   Skin: No Rash.   Wounds: N/A.    Intake/Output from previous day:  Intake/Output Summary (Last 24 hours) at 08/16/14 1540 Last data filed at 08/16/14 0838  Gross per 24 hour  Intake    120 ml  Output     80 ml  Net     40 ml     LAB RESULTS: CBC  Recent Labs Lab 08/15/14 1632  WBC 8.3  HGB 9.5*  HCT 30.9*  PLT 107*  MCV 95.1  MCH 29.2  MCHC 30.7  RDW 16.0*  LYMPHSABS 1.5  MONOABS 0.4  EOSABS 0.0  BASOSABS 0.0    Chemistries   Recent Labs Lab 08/15/14 1632  NA 147  K 4.0  CL 105  CO2 29  GLUCOSE 137*  BUN 25*  CREATININE 1.59*  CALCIUM 7.9*    CBG:  Recent Labs Lab 08/15/14 2142 08/16/14 0631 08/16/14 1147 08/16/14 1150  GLUCAP 128* 131* 168* 143*    GFR Estimated Creatinine Clearance: 34.9 mL/min (by C-G formula based on Cr of 1.59).  Coagulation profile No results for input(s): INR, PROTIME in the last 168 hours.  Cardiac Enzymes  Recent Labs Lab 08/15/14 2026 08/16/14 0750  TROPONINI 0.52* 0.43*    Invalid input(s): POCBNP No results for input(s): DDIMER in the last 72 hours. No results for input(s): HGBA1C in the last 72 hours. No results for input(s): CHOL, HDL, LDLCALC, TRIG, CHOLHDL, LDLDIRECT in the last 72 hours. No results for input(s): TSH, T4TOTAL, T3FREE, THYROIDAB in the last 72 hours.  Invalid input(s): FREET3 No results for input(s): VITAMINB12, FOLATE, FERRITIN, TIBC, IRON, RETICCTPCT in the last 72 hours. No results for input(s): LIPASE, AMYLASE in the last 72 hours.  Urine Studies No results for input(s): UHGB, CRYS in the last 72 hours.  Invalid input(s): UACOL, UAPR, USPG, UPH, UTP, UGL, UKET, UBIL, UNIT, UROB, ULEU, UEPI, UWBC, URBC, UBAC, CAST, UCOM, BILUA  MICROBIOLOGY: No results  found for this or any previous visit (from the past 240 hour(s)).  RADIOLOGY STUDIES/RESULTS: Ct Head Wo Contrast  08/15/2014   CLINICAL DATA:  Prior stroke with unknown visual deficit, Pt son reports that at [place] pt had a "blank stare" and "seemed different" No known hx of dementia.  EXAM: CT HEAD WITHOUT CONTRAST  TECHNIQUE: Contiguous axial images were obtained from the base of the skull through the vertex without intravenous contrast.  COMPARISON:  MRI brain 07/04/2014  FINDINGS: There is no evidence of mass effect, midline shift, or extra-axial fluid collections. There is no evidence of a space-occupying lesion or intracranial hemorrhage. There is no evidence of a cortical-based area of acute infarction. There is an old right parietal lobe  infarct with encephalomalacia. There is generalized cerebral atrophy. There is periventricular white matter low attenuation likely secondary to microangiopathy.  The ventricles and sulci are appropriate for the patient's age. The basal cisterns are patent.  Visualized portions of the orbits are unremarkable. The visualized portions of the paranasal sinuses and mastoid air cells are unremarkable. Cerebrovascular atherosclerotic calcifications are noted.  The osseous structures are unremarkable.  IMPRESSION: 1. No acute intracranial pathology. 2. Chronic microvascular disease and cerebral atrophy.   Electronically Signed   By: Kathreen Devoid   On: 08/15/2014 19:21   Mr Brain Wo Contrast  08/16/2014   CLINICAL DATA:  Acute encephalopathy  EXAM: MRI HEAD WITHOUT CONTRAST  TECHNIQUE: Multiplanar, multiecho pulse sequences of the brain and surrounding structures were obtained without intravenous contrast.  COMPARISON:  CT 08/15/2014.  MRI 07/04/2014  FINDINGS: The patient was not able to complete the study. Axial diffusion, T2 , and FLAIR imaging was performed prior to terminating the study.  Recent acute infarct in the right occipital lobe on the prior MRI is resolving on  diffusion. There are multiple new areas of acute infarct.  Multiple areas of restricted diffusion are present consistent with acute infarct involving the cerebellum bilaterally, right occipital lobe inferior to the previously identified infarct, right basal ganglia, and small areas of acute infarct in the frontal and parietal cortex bilaterally. These are present in multiple vascular distributions and are most consistent with emboli.  Advanced atrophy. Chronic infarcts in the occipital lobes bilaterally. Chronic microvascular ischemia in the white matter.  Image quality degraded by motion.  IMPRESSION: Numerous small areas of acute infarction consistent with emboli.  Advanced atrophy.  Chronic ischemic changes.  The patient was not able to complete the study.   Electronically Signed   By: Franchot Gallo M.D.   On: 08/16/2014 10:34   Dg Chest Port 1 View  08/16/2014   CLINICAL DATA:  Shortness of breath  EXAM: PORTABLE CHEST - 1 VIEW  COMPARISON:  08/15/2014  FINDINGS: Chronic cardiomegaly. 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 This represents a large thyroid nodule on chest CT 03/28/2010. There are coarse interstitial opacities which appear similar to previous. No evidence for superimposed pneumonia or edema. No effusion or pneumothorax.  IMPRESSION: 1. Fibrotic lung disease without acute superimposed finding. 2. Chronic right thyroid nodule with tracheal deviation.   Electronically Signed   By: Jorje Guild M.D.   On: 08/16/2014 03:49   Dg Chest Port 1 View  08/15/2014   CLINICAL DATA:  Altered mental status 1 day. Hypertension and atrial fibrillation.  EXAM: PORTABLE CHEST - 1 VIEW  COMPARISON:  07/04/2014 and 04/17/2013 as well as chest CT 04/10/2011  FINDINGS: Patient is rotated to the left. Lungs are hypoinflated with persistent bilateral coarse interstitial changes compatible with known emphysematous disease and fibrosis. There is mild increased density over the right  midlung/hilar region which may be partially due to patient's rotation although cannot exclude developing infectious process. No evidence of effusion. Cardiomediastinal silhouette and remainder of the exam is unchanged.  IMPRESSION: Findings compatible patient's known emphysematous disease and fibrosis. There is slight increased density over the right hilar/ perihilar region which may be due to the degree of rotation although cannot exclude developing infectious process. Consider repeat frontal radiograph with better positioning versus PA and lateral film.   Electronically Signed   By: Marin Olp M.D.   On: 08/15/2014 16:47    Oren Binet, MD  [facility] Pager:[phone]  If 7PM-7AM,  please contact night-coverage [email or web address] Password [email or web address] 08/16/2014, 3:40 PM   LOS: 1 day

## 2014-08-16 NOTE — Progress Notes (Signed)
CCMD notified writer of patient having a 17 beat run of Vtach. Patient asymptomatic upon assessment. NP notified and orders given. Will continue to monitor patient closely. Ileene Rubens Helder Crisafulli,RN

## 2014-08-16 NOTE — Progress Notes (Signed)
ANTIBIOTIC CONSULT NOTE - INITIAL  Pharmacy Consult for vancomycin Indication: rule out pneumonia  Allergies  Allergen Reactions  . Levofloxacin Nausea Only  . Ultram [Tramadol Hcl] Nausea Only    Patient Measurements: Height: 5\' 6"  (167.6 cm) Weight: 222 lb 3.2 oz (100.789 kg) IBW/kg (Calculated) : 59.3  Vital Signs: Temp: 97.7 F (36.5 C) (11/25 0137) Temp Source: Oral (11/25 0137) BP: 114/78 mmHg (11/25 0229) Pulse Rate: 72 (11/25 0229)  Labs:  Recent Labs  08/15/14 1632  WBC 8.3  HGB 9.5*  PLT 107*  CREATININE 1.59*   Estimated Creatinine Clearance: 34.9 mL/min (by C-G formula based on Cr of 1.59).  Medical History: Past Medical History  Diagnosis Date  . GERD 06/03/2007  . HYPERTENSION 06/03/2007  . OSTEOARTHRITIS 06/03/2007  . PEPTIC ULCER DISEASE 06/03/2007  . TRANSIENT ISCHEMIC ATTACK, HX OF 06/03/2007  . CONGESTIVE HEART FAILURE 06/03/2007    -2d echo 6/77/37 mild diastolic dysfunction, nl LA, RV  . DIABETES MELLITUS, TYPE II 06/03/2007  . LOW BACK PAIN 06/03/2007  . COPD 06/03/2007    Dr Wende Mott  . DEPRESSION 06/03/2007  . 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 Atrial fibrillation 08/10/2007  . HEMORRHOIDS 08/08/2009  . CONSTIPATION, INTERMITTENT 02/15/2010  . DYSPNEA/SHORTNESS OF BREATH 06/09/2008  . INTERSTITIAL LUNG DISEASE 08/10/2007    -onset 12/03 with definite steroid responsive component -PFT's October 11, 2009: VC 67%, no airflow obstruction DLC0 14%  . Allergic rhinitis   . Diverticulosis 05/11/2011  . Goiter 05/11/2011  . Shingles 05/11/2011    Medications:  Prescriptions prior to admission  Medication Sig Dispense Refill Last Dose  . apixaban (ELIQUIS) 5 MG TABS tablet Take 1 tablet (5 mg total) by mouth 2 (two) times daily. 60 tablet 1 08/15/2014 at Unknown time  . linagliptin (TRADJENTA) 5 MG TABS tablet Take 5 mg by mouth daily.   08/15/2014 at Unknown time  . lovastatin (MEVACOR) 40 MG tablet Take 1 tablet (40 mg total) by  mouth at bedtime. 90 tablet 3 08/14/2014 at Unknown time  . nebivolol (BYSTOLIC) 10 MG tablet Take 10 mg by mouth every morning.   08/15/2014 at Unknown time  . omeprazole (PRILOSEC) 20 MG capsule Take 20 mg by mouth every morning.   08/15/2014 at Unknown time  . predniSONE (DELTASONE) 10 MG tablet take 1 tablet by mouth once daily 30 tablet 3 08/15/2014 at Unknown time  . tiotropium (SPIRIVA) 18 MCG inhalation capsule Place 18 mcg into inhaler and inhale daily.   08/15/2014 at Unknown time   Scheduled:  . apixaban  5 mg Oral BID  . ceFEPime (MAXIPIME) IV  2 g Intravenous Q24H  . hydrocortisone sod succinate (SOLU-CORTEF) inj  50 mg Intravenous Q6H  . insulin aspart  0-9 Units Subcutaneous TID WC  . linagliptin  5 mg Oral Daily  . nebivolol  10 mg Oral Daily  . pantoprazole  40 mg Oral Daily  . pravastatin  20 mg Oral q1800  . [START ON 08/17/2014] predniSONE  10 mg Oral QAC breakfast  . tiotropium  18 mcg Inhalation Daily    Assessment: 80yo female presents to ED w/ AMS per family, concern for sepsis, CXR cannot exclude infection, to begin IV ABX.  Goal of Therapy:  Vancomycin trough level 15-20 mcg/ml  Plan:  Will begin vancomycin 1250mg  IV Q24H and monitor CBC, Cx, levels prn.  Wynona Neat, PharmD, BCPS  08/16/2014,3:19 AM

## 2014-08-16 NOTE — Evaluation (Signed)
Physical Therapy Evaluation Patient Details Name: [name] MRN: [id number] DOB: [birth year] Today's Date: 08/16/2014   History of Present Illness  Patient is a 80 y/o female admitted with staring spells and confusion. UA showing features consistent with UTI. Chest x-ray was showing chronic changes with possible added infiltrates. MRI brain-Numerous small areas of acute infarction consistent with emboli.  PMH of A-fib, CKD, CVA, diastolic heart failure and  DM.     Clinical Impression  Patient presents with functional limitations due to deficits listed in PT problem list (see below). Pt with impaired AROM/PROM of all extremities, contractures, cognitive deficits and inability to actively follow commands limiting safe mobility. Pt is bedbound at baseline and uses hoyer lift to transfer to w/c. Pt is total A for all care, even feeding. Lengthy discussion with son about proper positioning at home to prevent further contractures/bed sores and importance of sitting in w/c daily to help prevent PNA. Discussed possibility of getting aide to come in to assist son with care. Pt's son interested in going home with hospice care- needs an aide. Pt does not require skilled therapy services as pt functioning close to baseline from a mobility stand point. All education provided. If family decides not to go home on hospice, pt appropriate for SNF with hospice. Discharge from therapy.    Follow Up Recommendations No PT follow up;Supervision/Assistance - 24 hour (Recommend aide.)    Equipment Recommendations  None recommended by PT Recommend PRAFO boots for hospital.   Recommendations for Other Services OT consult     Precautions / Restrictions Precautions Precaution Comments: multiple wounds Restrictions Weight Bearing Restrictions: No      Mobility  Bed Mobility               General bed mobility comments: Not assessed today as pt not actively following commands to mobilize extremities. Per RN  and son, pt total A for rolling at baseline.  Transfers                 General transfer comment: Not assessed.  Ambulation/Gait                Stairs            Wheelchair Mobility    Modified Rankin (Stroke Patients Only) Modified Rankin (Stroke Patients Only) Pre-Morbid Rankin Score: Severe disability Modified Rankin: Severe disability     Balance                                             Pertinent Vitals/Pain Pain Assessment: Faces Faces Pain Scale: Hurts a little bit Pain Location: BLEs with mobility. Pain Descriptors / Indicators: Sore Pain Intervention(s): Limited activity within patient's tolerance;Monitored during session;Repositioned    Home Living Family/patient expects to be discharged to:: Private residence Living Arrangements: Children;Other relatives Available Help at Discharge: Family;Available 24 hours/day Type of Home: House Home Access: Ramped entrance     Home Layout: One level Home Equipment: Hospital bed;Wheelchair - Education administrator (comment);Bedside commode (hoyer lift)      Prior Function Level of Independence: Needs assistance   Gait / Transfers Assistance Needed: Pt is bedbound at baseline per son, non ambulatory for ~2-3 years. Using hoyer lift to get to chair at baseline. Recently left nursing home after CVA ~1 month ago, using hoyer lift for transfers there as well.  ADL's / Homemaking Assistance Needed:  Total A for all care.   Comments: Son present to report PLOF/history as pt poor historian.      Hand Dominance        Extremity/Trunk Assessment   Upper Extremity Assessment: Defer to OT evaluation;Difficult to assess due to impaired cognition (Limited AROM throughout BUEs - contractures noted in biceps bilaterally.  Not able to passively fully extend elbows. Wrist flexed bil. No grip right hand. Limited grip, if any left hand. Assessment limited due to cognition.)           Lower  Extremity Assessment: Difficult to assess due to impaired cognition;Generalized weakness RLE Deficits / Details: Limited PROM RLE - ~15 degrees knee flexion with pain, DF AROM almost to neutral (lacking ~5 degrees). No AROM knee flexion or hip abduction (weakness vs following commands?), minimal AROM ankle DF to command. Unable to move legs to command. Bil feet with what appears to be diabetic ulcer wounds. LLE Deficits / Details: Limited PROM LLE- ~10 degrees knee flexion with pain, no AROM DF. Hip abduction limited to ~15 degrees with pain. Pt unable to actively move LLE on command. No AROM ankle DF.     Communication   Communication: Expressive difficulties  Cognition Arousal/Alertness: Awake/alert Behavior During Therapy: Flat affect Overall Cognitive Status: Impaired/Different from baseline Area of Impairment: Orientation;Following commands;Memory;Problem solving Orientation Level: Disoriented to;Time;Situation (not able to state birthday)   Memory: Decreased short-term memory Following Commands: Follows one step commands with increased time     Problem Solving: Slow processing;Requires verbal cues;Requires tactile cues General Comments: Not able to state son's name or recognize his voice (present inr oom, "Who am I mom?")    General Comments General comments (skin integrity, edema, etc.): Wounds on bil LE toes. Lengthy discussion with pt's son about proper positioning and importance of performing AROM of all extremities to prevent further contractures. Educated on positioning of pillows and turning to prevent bed sores.     Exercises        Assessment/Plan    PT Assessment Patent does not need any further PT services  PT Diagnosis Altered mental status;Generalized weakness;Acute pain   PT Problem List Decreased strength;Decreased range of motion;Decreased cognition;Decreased activity tolerance;Impaired tone;Decreased balance;Decreased mobility;Decreased skin  integrity;Obesity;Decreased safety awareness;Cardiopulmonary status limiting activity;Pain  PT Treatment Interventions     PT Goals (Current goals can be found in the Care Plan section) Acute Rehab PT Goals PT Goal Formulation: All assessment and education complete, DC therapy    Frequency     Barriers to discharge        Co-evaluation               End of Session   Activity Tolerance: Patient limited by pain;Other (comment) (Inability to follow commands.) Patient left: in bed;with call bell/phone within reach;with bed alarm set;with family/visitor present Nurse Communication: Other (comment)         Time: 6546-5035 PT Time Calculation (min) (ACUTE ONLY): 28 min   Charges:   PT Evaluation $Initial PT Evaluation Tier I: 1 Procedure PT Treatments $Self Care/Home Management: 8-22   PT G CodesCandy Sledge A 08/16/2014, 3:31 PM  Candy Sledge, Talty, DPT [phone]

## 2014-08-16 NOTE — Consult Note (Addendum)
Referring Physician: Sloan Leiter    Chief Complaint: Starring spells  HPI: [name] is an 80 y.o. female with a history of a stroke last month who had admission prompted by her son.  It seems that she was having starring spells.  They could last for hours and then resolve spontaneously.  The patient is unaware of these spells.  In October patient diagnosed with a right occipital infarct. Was on ASA at admission but with history of afib and low risk of falls due to being wheelchair bound patient was changed to Apixaban.    Date last known well: Unable to determine Time last known well: Unable to determine tPA Given: No: Patient on Apixaban, unknown LKW, recent infarct  Past Medical History  Diagnosis Date  . GERD 06/03/2007  . HYPERTENSION 06/03/2007  . OSTEOARTHRITIS 06/03/2007  . PEPTIC ULCER DISEASE 06/03/2007  . TRANSIENT ISCHEMIC ATTACK, HX OF 06/03/2007  . CONGESTIVE HEART FAILURE 06/03/2007    -2d echo 04/02/44 mild diastolic dysfunction, nl LA, RV  . DIABETES MELLITUS, TYPE II 06/03/2007  . LOW BACK PAIN 06/03/2007  . COPD 06/03/2007    Dr Wende Mott  . DEPRESSION 06/03/2007  . 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 Atrial fibrillation 08/10/2007  . HEMORRHOIDS 08/08/2009  . CONSTIPATION, INTERMITTENT 02/15/2010  . DYSPNEA/SHORTNESS OF BREATH 06/09/2008  . INTERSTITIAL LUNG DISEASE 08/10/2007    -onset 12/03 with definite steroid responsive component -PFT's October 11, 2009: VC 67%, no airflow obstruction DLC0 14%  . Allergic rhinitis   . Diverticulosis 05/11/2011  . Goiter 05/11/2011  . Shingles 05/11/2011    Past Surgical History  Procedure Laterality Date  . Stomach tumors removed  08/22/1991  . Abdominal hysterectomy  10/31/1983    TAH    Family History  Problem Relation Age of Onset  . Hypertension Mother   . Hypertension Father   . Cancer Sister     Breast Cancer, Liver Cancer   Social History:  reports that she has never smoked. She has never used smokeless  tobacco. She reports that she does not drink alcohol or use illicit drugs.  Allergies:  Allergies  Allergen Reactions  . Levofloxacin Nausea Only  . Ultram [Tramadol Hcl] Nausea Only    Medications:  I have reviewed the patient's current medications. Prior to Admission:  Prescriptions prior to admission  Medication Sig Dispense Refill Last Dose  . apixaban (ELIQUIS) 5 MG TABS tablet Take 1 tablet (5 mg total) by mouth 2 (two) times daily. 60 tablet 1 08/15/2014 at Unknown time  . linagliptin (TRADJENTA) 5 MG TABS tablet Take 5 mg by mouth daily.   08/15/2014 at Unknown time  . lovastatin (MEVACOR) 40 MG tablet Take 1 tablet (40 mg total) by mouth at bedtime. 90 tablet 3 08/14/2014 at Unknown time  . nebivolol (BYSTOLIC) 10 MG tablet Take 10 mg by mouth every morning.   08/15/2014 at Unknown time  . omeprazole (PRILOSEC) 20 MG capsule Take 20 mg by mouth every morning.   08/15/2014 at Unknown time  . predniSONE (DELTASONE) 10 MG tablet take 1 tablet by mouth once daily 30 tablet 3 08/15/2014 at Unknown time  . tiotropium (SPIRIVA) 18 MCG inhalation capsule Place 18 mcg into inhaler and inhale daily.   08/15/2014 at Unknown time   Scheduled: . apixaban  5 mg Oral BID  . ceFEPime (MAXIPIME) IV  2 g Intravenous Q24H  . insulin aspart  0-9 Units Subcutaneous TID WC  . linagliptin  5 mg  Oral Daily  . pantoprazole  40 mg Oral Daily  . pravastatin  20 mg Oral q1800  . [START ON 08/17/2014] predniSONE  10 mg Oral QAC breakfast  . tiotropium  18 mcg Inhalation Daily  . vancomycin  1,250 mg Intravenous Q24H    ROS: History obtained from the patient  General ROS: negative for - chills, fatigue, fever, night sweats, weight gain or weight loss Psychological ROS: negative for - behavioral disorder, hallucinations, memory difficulties, mood swings or suicidal ideation Ophthalmic ROS: unable to see ENT ROS: negative for - epistaxis, nasal discharge, oral lesions, sore throat, tinnitus or  vertigo Allergy and Immunology ROS: negative for - hives or itchy/watery eyes Hematological and Lymphatic ROS: negative for - bleeding problems, bruising or swollen lymph nodes Endocrine ROS: negative for - galactorrhea, hair pattern changes, polydipsia/polyuria or temperature intolerance Respiratory ROS: negative for - cough, hemoptysis, shortness of breath or wheezing Cardiovascular ROS: negative for - chest pain, dyspnea on exertion, edema or irregular heartbeat Gastrointestinal ROS: negative for - abdominal pain, diarrhea, hematemesis, nausea/vomiting or stool incontinence Genito-Urinary ROS: negative for - dysuria, hematuria, incontinence or urinary frequency/urgency Musculoskeletal ROS: negative for - joint swelling or muscular weakness Neurological ROS: as noted in HPI Dermatological ROS: toes with excoriated skin on left foot and bleeding  Physical Examination: Blood pressure 130/61, pulse 75, temperature 98.1 F (36.7 C), temperature source Oral, resp. rate 16, height 5\' 6"  (1.676 m), weight 100.789 kg (222 lb 3.2 oz), SpO2 100 %.  HEENT-  Normocephalic, no lesions, without obvious abnormality.  Normal external eye and conjunctiva.  Normal TM's bilaterally.  Normal auditory canals and external ears. Normal external nose, mucus membranes and septum.  Normal pharynx. Cardiovascular- S1, S2 normal, pulses palpable throughout   Lungs- chest clear, no wheezing, rales, normal symmetric air entry Abdomen- hard but non-tender, positive bowel sounds, no organomegaly Extremities- pitting edema bilaterally Lymph-no adenopathy palpable Musculoskeletal- tenderness on palpation of the legs bilaterally.  Arms flexed and right hand fisted. Skin-toes on left foot with hyperpigmented skin, some of which is torn anad bleeding  Neurologic Examination: Mental Status: Alert, oriented to name but not place or time.  Speech fluent without evidence of aphasia but tends to stick to short sentences.   Follows some commands well but seems to have difficulty understanding others.  Cranial Nerves: II: Discs flat bilaterally; Patient does not blink to bilateral confrontation, pupils unreactive to light and accommodation III,IV, VI: ptosis not present, right gaze preference.  With oculocephalic maneuver unable to get eyes beyond midline V,VII: smile symmetric, facial light touch sensation normal bilaterally VIII: hearing normal bilaterally IX,X: gag reflex present XI: bilateral shoulder shrug XII: midline tongue extension Motor: Patient with increased tone throughout, right greater than left.  Able to lift arms a few degrees off of her chest (arms in flexed position).  Unable to lift the legs off the bed but does wiggle her toes (right greater than left) Sensory: Pinprick and light touch intact throughout, bilaterally Deep Tendon Reflexes: 2+ in the upper extremities and absent in the lower extremities Plantars: Right: mute   Left: mute Cerebellar: Unable to perform   Laboratory Studies:  Basic Metabolic Panel:  Recent Labs Lab 08/15/14 1632  NA 147  K 4.0  CL 105  CO2 29  GLUCOSE 137*  BUN 25*  CREATININE 1.59*  CALCIUM 7.9*    Liver Function Tests:  Recent Labs Lab 08/15/14 1632  AST 18  ALT 12  ALKPHOS 75  BILITOT 0.5  PROT 5.6*  ALBUMIN 2.2*   No results for input(s): LIPASE, AMYLASE in the last 168 hours. No results for input(s): AMMONIA in the last 168 hours.  CBC:  Recent Labs Lab 08/15/14 1632  WBC 8.3  NEUTROABS 6.4  HGB 9.5*  HCT 30.9*  MCV 95.1  PLT 107*    Cardiac Enzymes:  Recent Labs Lab 08/15/14 2026 08/16/14 0750  TROPONINI 0.52* 0.43*    BNP: Invalid input(s): POCBNP  CBG:  Recent Labs Lab 08/15/14 2142 08/16/14 0631 08/16/14 1147 08/16/14 1150  GLUCAP 128* 131* 168* 143*    Microbiology: Results for orders placed or performed during the hospital encounter of 04/17/13  Culture, blood (routine x 2) Call MD if unable  to obtain prior to antibiotics being given     Status: None   Collection Time: 04/18/13  5:01 AM  Result Value Ref Range Status   Specimen Description BLOOD LEFT FOREARM  Final   Special Requests BOTTLES DRAWN AEROBIC ONLY 8CC  Final   Culture  Setup Time 04/18/2013 10:41  Final   Culture NO GROWTH 5 DAYS  Final   Report Status 04/24/2013 FINAL  Final  Culture, blood (routine x 2) Call MD if unable to obtain prior to antibiotics being given     Status: None   Collection Time: 04/18/13  5:07 AM  Result Value Ref Range Status   Specimen Description BLOOD LEFT HAND  Final   Special Requests BOTTLES DRAWN AEROBIC ONLY 2CC  Final   Culture  Setup Time 04/18/2013 10:41  Final   Culture NO GROWTH 5 DAYS  Final   Report Status 04/24/2013 FINAL  Final    Coagulation Studies: No results for input(s): LABPROT, INR in the last 72 hours.  Urinalysis:  Recent Labs Lab 08/15/14 1705  COLORURINE YELLOW  LABSPEC 1.020  PHURINE 5.0  GLUCOSEU NEGATIVE  HGBUR SMALL*  BILIRUBINUR SMALL*  KETONESUR 15*  PROTEINUR 30*  UROBILINOGEN 1.0  NITRITE NEGATIVE  LEUKOCYTESUR MODERATE*    Lipid Panel:    Component Value Date/Time   CHOL 126 07/05/2014 1520   TRIG 152* 07/05/2014 1520   HDL 49 07/05/2014 1520   CHOLHDL 2.6 07/05/2014 1520   VLDL 30 07/05/2014 1520   LDLCALC 47 07/05/2014 1520    HgbA1C:  Lab Results  Component Value Date   HGBA1C 7.6* 03/10/2014    Urine Drug Screen:  No results found for: LABOPIA, COCAINSCRNUR, LABBENZ, AMPHETMU, THCU, LABBARB  Alcohol Level: No results for input(s): ETH in the last 168 hours.  Other results: EKG: sinus rhythm at 88 bpm.  Imaging: Ct Head Wo Contrast  08/15/2014   CLINICAL DATA:  Prior stroke with unknown visual deficit, Pt son reports that at [place] pt had a "blank stare" and "seemed different" No known hx of dementia.  EXAM: CT HEAD WITHOUT CONTRAST  TECHNIQUE: Contiguous axial images were obtained from the base of the skull through  the vertex without intravenous contrast.  COMPARISON:  MRI brain 07/04/2014  FINDINGS: There is no evidence of mass effect, midline shift, or extra-axial fluid collections. There is no evidence of a space-occupying lesion or intracranial hemorrhage. There is no evidence of a cortical-based area of acute infarction. There is an old right parietal lobe infarct with encephalomalacia. There is generalized cerebral atrophy. There is periventricular white matter low attenuation likely secondary to microangiopathy.  The ventricles and sulci are appropriate for the patient's age. The basal cisterns are patent.  Visualized portions of the orbits are unremarkable. The  visualized portions of the paranasal sinuses and mastoid air cells are unremarkable. Cerebrovascular atherosclerotic calcifications are noted.  The osseous structures are unremarkable.  IMPRESSION: 1. No acute intracranial pathology. 2. Chronic microvascular disease and cerebral atrophy.   Electronically Signed   By: Kathreen Devoid   On: 08/15/2014 19:21   Mr Brain Wo Contrast  08/16/2014   CLINICAL DATA:  Acute encephalopathy  EXAM: MRI HEAD WITHOUT CONTRAST  TECHNIQUE: Multiplanar, multiecho pulse sequences of the brain and surrounding structures were obtained without intravenous contrast.  COMPARISON:  CT 08/15/2014.  MRI 07/04/2014  FINDINGS: The patient was not able to complete the study. Axial diffusion, T2 , and FLAIR imaging was performed prior to terminating the study.  Recent acute infarct in the right occipital lobe on the prior MRI is resolving on diffusion. There are multiple new areas of acute infarct.  Multiple areas of restricted diffusion are present consistent with acute infarct involving the cerebellum bilaterally, right occipital lobe inferior to the previously identified infarct, right basal ganglia, and small areas of acute infarct in the frontal and parietal cortex bilaterally. These are present in multiple vascular distributions and are  most consistent with emboli.  Advanced atrophy. Chronic infarcts in the occipital lobes bilaterally. Chronic microvascular ischemia in the white matter.  Image quality degraded by motion.  IMPRESSION: Numerous small areas of acute infarction consistent with emboli.  Advanced atrophy.  Chronic ischemic changes.  The patient was not able to complete the study.   Electronically Signed   By: Franchot Gallo M.D.   On: 08/16/2014 10:34   Dg Chest Port 1 View  08/16/2014   CLINICAL DATA:  Shortness of breath  EXAM: PORTABLE CHEST - 1 VIEW  COMPARISON:  08/15/2014  FINDINGS: Chronic cardiomegaly. 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 This represents a large thyroid nodule on chest CT 03/28/2010. There are coarse interstitial opacities which appear similar to previous. No evidence for superimposed pneumonia or edema. No effusion or pneumothorax.  IMPRESSION: 1. Fibrotic lung disease without acute superimposed finding. 2. Chronic right thyroid nodule with tracheal deviation.   Electronically Signed   By: Jorje Guild M.D.   On: 08/16/2014 03:49   Dg Chest Port 1 View  08/15/2014   CLINICAL DATA:  Altered mental status 1 day. Hypertension and atrial fibrillation.  EXAM: PORTABLE CHEST - 1 VIEW  COMPARISON:  07/04/2014 and 04/17/2013 as well as chest CT 04/10/2011  FINDINGS: Patient is rotated to the left. Lungs are hypoinflated with persistent bilateral coarse interstitial changes compatible with known emphysematous disease and fibrosis. There is mild increased density over the right midlung/hilar region which may be partially due to patient's rotation although cannot exclude developing infectious process. No evidence of effusion. Cardiomediastinal silhouette and remainder of the exam is unchanged.  IMPRESSION: Findings compatible patient's known emphysematous disease and fibrosis. There is slight increased density over the right hilar/ perihilar region which may be due to the degree of  rotation although cannot exclude developing infectious process. Consider repeat frontal radiograph with better positioning versus PA and lateral film.   Electronically Signed   By: Marin Olp M.D.   On: 08/15/2014 16:47    Assessment: 80 y.o. female presenting with starring episodes.  She has a recent history of stroke and a history of atrial fibrillation.  On Apixaban.  Head CT personally reviewed and shows no acute changes.  MRI of the brain personally reviewed and shows multiple bilateral small areas of acute infarct in both the  anterior and posterior circulatory distributions.   Recent work up from October include a normal echocardiogram and carotid doppler with 1-39% stenosis bilaterally.  A1c was 6.6 and LDL was 80.    Stroke Risk Factors - atrial fibrillation, diabetes mellitus and hypertension  Plan: 1. HgbA1c, fasting lipid panel 2. PT consult, OT consult, Speech consult 3. Prophylactic therapy-Continue Apixaban for now.  Patient considered a failure at this time with imaging highly suggestive of embolus.  Stroke team to make final decision for prophylaxis.  With history of afib will require some form of anticoagulation.   4. NPO until RN stroke swallow screen 5. Telemetry monitoring 6. Frequent neuro checks 7. EEG reviewed.  No epileptiform activity noted.  No anticonvulsant therapy indicated at this time.   8. Consider BLE dopplers to rule out DVT    Alexis Goodell, MD [facility] [phone] 08/16/2014, 5:01 PM

## 2014-08-16 NOTE — Progress Notes (Signed)
UR COMPLETED  

## 2014-08-16 NOTE — Progress Notes (Signed)
Patient's limbs are extremely edematous (+3) at this time. Right arm has gotten worse where her arm band had to be cut off. BP is 126/38. Slight expiratory wheezing in the lungs. Physician paged and given orders to D/C fluids and recheck BP in half an hour. If BP drops call back. Pt's O2 sats 100% on 2L and HR in the 70's. Will continue to monitor.

## 2014-08-16 NOTE — Progress Notes (Signed)
*  PRELIMINARY RESULTS* Vascular Ultrasound Right upper extremity venous duplex has been completed.  Preliminary findings: Technically limited due to patient inability to extend right arm. No obvious DVT or SVT noted.   Landry Mellow, RDMS, RVT  08/16/2014, 11:19 AM

## 2014-08-17 LAB — BASIC METABOLIC PANEL
ANION GAP: 13 (ref 5–15)
BUN: 30 mg/dL — ABNORMAL HIGH (ref 6–23)
CO2: 26 meq/L (ref 19–32)
Calcium: 7.8 mg/dL — ABNORMAL LOW (ref 8.4–10.5)
Chloride: 107 mEq/L (ref 96–112)
Creatinine, Ser: 1.56 mg/dL — ABNORMAL HIGH (ref 0.50–1.10)
GFR calc Af Amer: 36 mL/min — ABNORMAL LOW (ref >90)
GFR, EST NON AFRICAN AMERICAN: 31 mL/min — AB (ref >90)
Glucose, Bld: 117 mg/dL — ABNORMAL HIGH (ref 70–99)
POTASSIUM: 4 meq/L (ref 3.7–5.3)
SODIUM: 146 meq/L (ref 137–147)

## 2014-08-17 LAB — GLUCOSE, CAPILLARY
GLUCOSE-CAPILLARY: 118 mg/dL — AB (ref 70–99)
Glucose-Capillary: 154 mg/dL — ABNORMAL HIGH (ref 70–99)
Glucose-Capillary: 151 mg/dL — ABNORMAL HIGH (ref 70–99)
Glucose-Capillary: 180 mg/dL — ABNORMAL HIGH (ref 70–99)

## 2014-08-17 LAB — MAGNESIUM: MAGNESIUM: 2.3 mg/dL (ref 1.5–2.5)

## 2014-08-17 MED ORDER — SENNOSIDES-DOCUSATE SODIUM 8.6-50 MG PO TABS
2.0000 | ORAL_TABLET | Freq: Every day | ORAL | Status: DC
  Administered 2014-08-17 – 2014-08-18 (×2): 2 via ORAL
  Filled 2014-08-17 (×2): qty 2

## 2014-08-17 MED ORDER — MORPHINE SULFATE (CONCENTRATE) 10 MG/0.5ML PO SOLN
5.0000 mg | Freq: Three times a day (TID) | ORAL | Status: DC | PRN
  Administered 2014-08-17 – 2014-08-18 (×3): 5 mg via ORAL
  Filled 2014-08-17 (×3): qty 0.5

## 2014-08-17 MED ORDER — FUROSEMIDE 10 MG/ML IJ SOLN
60.0000 mg | Freq: Two times a day (BID) | INTRAMUSCULAR | Status: DC
  Administered 2014-08-17 – 2014-08-19 (×4): 60 mg via INTRAVENOUS
  Filled 2014-08-17 (×5): qty 8

## 2014-08-17 NOTE — Plan of Care (Signed)
Problem: Acute Treatment Outcomes Goal: Airway maintained/protected Outcome: Completed/Met Date Met:  08/17/14 Goal: 02 Sats > 94% Outcome: Completed/Met Date Met:  08/17/14 Goal: tPA Patient w/o S&S of bleeding Outcome: Not Applicable Date Met:  15/86/82  Problem: Progression Outcomes Goal: If vent dependent, tolerates weaning Outcome: Not Applicable Date Met:  57/49/35 Goal: Tolerating diet/TF at goal rate Outcome: Completed/Met Date Met:  08/17/14

## 2014-08-17 NOTE — Progress Notes (Signed)
STROKE TEAM PROGRESS NOTE   HISTORY [name] is an 80 y.o. female with a history of a stroke last month who had admission prompted by her son. It seems that she was having starring spells. They could last for hours and then resolve spontaneously. The patient is unaware of these spells. In October patient diagnosed with a right occipital infarct. Was on ASA at admission but with history of afib and low risk of falls due to being wheelchair bound patient was changed to Apixaban. Unable to determine last known well. Patient was not administered TPA secondary to Patient on Apixaban, unknown LKW, recent infarct within the past 90 days. She was admitted for further evaluation and treatment.   SUBJECTIVE (INTERVAL HISTORY) No family is at the bedside.     OBJECTIVE Temp:  [97.5 F (36.4 C)-98.6 F (37 C)] 98.3 F (36.8 C) (11/26 1029) Pulse Rate:  [75-83] 77 (11/26 1029) Cardiac Rhythm:  [-] Normal sinus rhythm (11/26 0750) Resp:  [16-18] 18 (11/26 1029) BP: (114-142)/(60-82) 122/60 mmHg (11/26 1029) SpO2:  [100 %] 100 % (11/26 1029) Weight:  [102.541 kg (226 lb 1 oz)] 102.541 kg (226 lb 1 oz) (11/26 0743)   Recent Labs Lab 08/16/14 1147 08/16/14 1150 08/16/14 1703 08/16/14 2110 08/17/14 0648  GLUCAP 168* 143* 146* 136* 118*    Recent Labs Lab 08/15/14 1632 08/17/14 0744  NA 147 146  K 4.0 4.0  CL 105 107  CO2 29 26  GLUCOSE 137* 117*  BUN 25* 30*  CREATININE 1.59* 1.56*  CALCIUM 7.9* 7.8*  MG  --  2.3    Recent Labs Lab 08/15/14 1632  AST 18  ALT 12  ALKPHOS 75  BILITOT 0.5  PROT 5.6*  ALBUMIN 2.2*    Recent Labs Lab 08/15/14 1632  WBC 8.3  NEUTROABS 6.4  HGB 9.5*  HCT 30.9*  MCV 95.1  PLT 107*    Recent Labs Lab 08/15/14 2026 08/16/14 0750  TROPONINI 0.52* 0.43*   No results for input(s): LABPROT, INR in the last 72 hours.  Recent Labs  08/15/14 1705  COLORURINE YELLOW  LABSPEC 1.020  PHURINE 5.0  GLUCOSEU NEGATIVE  HGBUR SMALL*   BILIRUBINUR SMALL*  KETONESUR 15*  PROTEINUR 30*  UROBILINOGEN 1.0  NITRITE NEGATIVE  LEUKOCYTESUR MODERATE*       Component Value Date/Time   CHOL 126 07/05/2014 1520   TRIG 152* 07/05/2014 1520   HDL 49 07/05/2014 1520   CHOLHDL 2.6 07/05/2014 1520   VLDL 30 07/05/2014 1520   LDLCALC 47 07/05/2014 1520   Lab Results  Component Value Date   HGBA1C 7.6* 03/10/2014   No results found for: LABOPIA, COCAINSCRNUR, LABBENZ, AMPHETMU, THCU, LABBARB  No results for input(s): ETH in the last 168 hours.  Ct Head Wo Contrast  08/15/2014   CLINICAL DATA:  Prior stroke with unknown visual deficit, Pt son reports that at [place] pt had a "blank stare" and "seemed different" No known hx of dementia.  EXAM: CT HEAD WITHOUT CONTRAST  TECHNIQUE: Contiguous axial images were obtained from the base of the skull through the vertex without intravenous contrast.  COMPARISON:  MRI brain 07/04/2014  FINDINGS: There is no evidence of mass effect, midline shift, or extra-axial fluid collections. There is no evidence of a space-occupying lesion or intracranial hemorrhage. There is no evidence of a cortical-based area of acute infarction. There is an old right parietal lobe infarct with encephalomalacia. There is generalized cerebral atrophy. There is periventricular white matter low  attenuation likely secondary to microangiopathy.  The ventricles and sulci are appropriate for the patient's age. The basal cisterns are patent.  Visualized portions of the orbits are unremarkable. The visualized portions of the paranasal sinuses and mastoid air cells are unremarkable. Cerebrovascular atherosclerotic calcifications are noted.  The osseous structures are unremarkable.  IMPRESSION: 1. No acute intracranial pathology. 2. Chronic microvascular disease and cerebral atrophy.   Electronically Signed   By: Kathreen Devoid   On: 08/15/2014 19:21   Mr Brain Wo Contrast  08/16/2014   CLINICAL DATA:  Acute encephalopathy  EXAM: MRI  HEAD WITHOUT CONTRAST  TECHNIQUE: Multiplanar, multiecho pulse sequences of the brain and surrounding structures were obtained without intravenous contrast.  COMPARISON:  CT 08/15/2014.  MRI 07/04/2014  FINDINGS: The patient was not able to complete the study. Axial diffusion, T2 , and FLAIR imaging was performed prior to terminating the study.  Recent acute infarct in the right occipital lobe on the prior MRI is resolving on diffusion. There are multiple new areas of acute infarct.  Multiple areas of restricted diffusion are present consistent with acute infarct involving the cerebellum bilaterally, right occipital lobe inferior to the previously identified infarct, right basal ganglia, and small areas of acute infarct in the frontal and parietal cortex bilaterally. These are present in multiple vascular distributions and are most consistent with emboli.  Advanced atrophy. Chronic infarcts in the occipital lobes bilaterally. Chronic microvascular ischemia in the white matter.  Image quality degraded by motion.  IMPRESSION: Numerous small areas of acute infarction consistent with emboli.  Advanced atrophy.  Chronic ischemic changes.  The patient was not able to complete the study.   Electronically Signed   By: Franchot Gallo M.D.   On: 08/16/2014 10:34   Dg Chest Port 1 View  08/16/2014   CLINICAL DATA:  Shortness of breath  EXAM: PORTABLE CHEST - 1 VIEW  COMPARISON:  08/15/2014  FINDINGS: Chronic cardiomegaly. 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 This represents a large thyroid nodule on chest CT 03/28/2010. There are coarse interstitial opacities which appear similar to previous. No evidence for superimposed pneumonia or edema. No effusion or pneumothorax.  IMPRESSION: 1. Fibrotic lung disease without acute superimposed finding. 2. Chronic right thyroid nodule with tracheal deviation.   Electronically Signed   By: Jorje Guild M.D.   On: 08/16/2014 03:49   Dg Chest Port 1  View  08/15/2014   CLINICAL DATA:  Altered mental status 1 day. Hypertension and atrial fibrillation.  EXAM: PORTABLE CHEST - 1 VIEW  COMPARISON:  07/04/2014 and 04/17/2013 as well as chest CT 04/10/2011  FINDINGS: Patient is rotated to the left. Lungs are hypoinflated with persistent bilateral coarse interstitial changes compatible with known emphysematous disease and fibrosis. There is mild increased density over the right midlung/hilar region which may be partially due to patient's rotation although cannot exclude developing infectious process. No evidence of effusion. Cardiomediastinal silhouette and remainder of the exam is unchanged.  IMPRESSION: Findings compatible patient's known emphysematous disease and fibrosis. There is slight increased density over the right hilar/ perihilar region which may be due to the degree of rotation although cannot exclude developing infectious process. Consider repeat frontal radiograph with better positioning versus PA and lateral film.   Electronically Signed   By: Marin Olp M.D.   On: 08/15/2014 16:47     PHYSICAL EXAM Frail elderly lady  Not in distress.Awake alert. Afebrile. Head is nontraumatic. Neck is supple without bruit. Hearing is normal. Cardiac  exam no murmur or gallop. Lungs are clear to auscultation. Distal pulses are well felt. Neurological Exam :  Mental Status: Alert, oriented to name but not place or time. Speech fluent without evidence of aphasia but tends to stick to short sentences. Follows some commands well but seems to have difficulty understanding others.  Cranial Nerves: II: Discs flat bilaterally; Patient does not blink to bilateral confrontation, pupils unreactive to light and accommodation III,IV, VI: ptosis not present, right gaze preference. With oculocephalic maneuver unable to get eyes beyond midline V,VII: smile symmetric, facial light touch sensation normal bilaterally VIII: hearing normal bilaterally IX,X: gag reflex  present XI: bilateral shoulder shrug XII: midline tongue extension Motor: Patient with increased tone throughout, right greater than left. Able to lift arms a few degrees off  her chest (arms in flexed  Contracture position). Unable to lift the legs off the bed but does wiggle her toes (right greater than left) Sensory: Pinprick and light touch intact throughout, bilaterally Deep Tendon Reflexes: 2+ in the upper extremities and absent in the lower extremities Plantars: Right: muteLeft: mute Cerebellar: Unable to perform  ASSESSMENT/PLAN [name] is a 80 y.o. female with history of stroke in October presenting with a staring spell. She did not receive IV t-PA due to stroke with in the past 90 days, on apixaban and unknown last known well.   Stroke:  Bilateral small embolic infarcts secondary to atrial fibrillation while on Eliquis   MRI  Bilateral small embolic infarcts  No need to repeat further stroke workup  Eliquis for VTE prophylaxis  Diet heart healthy/carb modified thin liquids  Ongoing aggressive stroke risk factor management  Therapy recommendations:  pending  Continue Eliquis at discharge  [facility]  [facility]   follow up with Dr. Erlinda Hong as requested at time of October discharge  Possible seizure  Check EEG  Atrial Fibrillation  Home meds:  apixaban continued in the hospital   Hypertension  Stable  Hyperlipidemia  Home meds:  Pravachol 40 resumed in hospital  LDL 47 in October, goal < 70  Continue statin at discharge  Diabetes 2  HgbA1c 7.6 in June, goal < 7.0  Other Stroke Risk Factors  Advanced age  Obesity, Body mass index is 36.5 kg/(m^2).   Hx stroke/TIA - TIA 2008, right PCA and bilateral embolic infarcts October 2015 secondary to atrial fibrillation  Other Active Problems  COPD on prednisone  Chronic diastolic heart failure on Lasix  Hospital day # 2  Burnetta Sabin,  MSN, APRN, ANVP-BC, AGPCNP-BC [facility] Pager: [phone] 08/17/2014 1:09 PM  I have personally examined this patient, reviewed notes, independently viewed imaging studies, participated in medical decision making and plan of care. I have made any additions or clarifications directly to the above note. Agree with note above. She has had multiple small embolic strokes despite anticoagulation with apixaban  Antony Contras, MD Medical Director [facility] Pager: [phone] 08/17/2014 10:55 PM   To contact Stroke Continuity provider, please refer to [email or web address] After hours, contact General Neurology

## 2014-08-17 NOTE — Progress Notes (Signed)
PATIENT DETAILS Name: [name] Age: 80 y.o. Sex: female Date of Birth: [birth year] Admit Date: 08/15/2014 Admitting Physician Rise Patience, MD DJM:EQAST Jenny Reichmann, MD  Subjective: Awake, follows some commands. Speech appears essentially unchanged  Assessment/Plan: Principal Problem:   Acute encephalopathy:secondary to acute CVA and UTI. EEG neg. Suspect back to baseline.  Active Problems:   Acute CVA: embolic given multiple areas involved in MRI Brain. Recent Echo/Carotids done-will not repeat. Already on Eliquis, suspect getting a TEE would not change this management. On ASA as well-given overall frailty-suspect does not need both ASA and Eliquis. Will stop ASA.Will defer to Neurology whether or not to consider this anticoagulation failure and change anticoagulants. Very frail, non ambulatory-has what appears b/l upper ext flexion contractures-with some minimal movement, and chronic b/l lower ext weakness (0/5).Suspect that further procedures/investigations would not add any quality/longevity to her life.    Atrial fibrillation: currently appears to be sinus. Continue Eliquis for now, will defer to Neurology whether or not to change anticoagulation    CKD (chronic kidney disease), stage MH:[id number] close to usual baseline. Monitor lytes periodically.    Chronic diastolic heart failure:appears to volume overloaded. Start IV Lasix-but no significant diuresis, will increase Lasix to 60 mg BID, strict intake output, daily weights.Place a foley for aggressive diuresis    Minimally Elevated troponin: suspect demand ischemia. Not a candidate for further work up.Treat Medically    Sepsis with Hypotension:Secondary to UTI, BP now stable. Stop Hydrocortisone and IVF.    QJJ:HERDEYCX, looks non toxic.  Continue IV Cefepime.Urine cs multiple bacterial morphotype's, will empirically treat for 5-7 days and not repeat culture    Diabetes mellitus type 2, controlled:CBG's controlled  with SSI. Resume oral hypoglycemic agents on discharge.    COPD/Interstitial lung disease:continue with chronic prednisone    Thrombocytopenia:mild, monitor. Repeat CBC in am    Palliative Care:very poor overall prognoses. Bed bound, poor quality of life. Spoke with Daughter on 11/25-[name] [phone]-family interested in home hospice care. I spoke to her about DNR, explained that she is not a candidate for aggressive care, Ms Loureiro agreed with DNR order.   Disposition: Remain inpatient-Home with Hospice on discharge.  Antibiotics:  See below.   Anti-infectives    Start     Dose/Rate Route Frequency Ordered Stop   08/16/14 2000  ceFEPIme (MAXIPIME) 2 g in dextrose 5 % 50 mL IVPB     2 g100 mL/hr over 30 Minutes Intravenous Every 24 hours 08/15/14 1952     08/16/14 0400  vancomycin (VANCOCIN) 1,250 mg in sodium chloride 0.9 % 250 mL IVPB  Status:  Discontinued     1,250 mg166.7 mL/hr over 90 Minutes Intravenous Every 24 hours 08/16/14 0326 08/16/14 1720   08/15/14 1930  ceFEPIme (MAXIPIME) 2 g in dextrose 5 % 50 mL IVPB     2 g100 mL/hr over 30 Minutes Intravenous  Once 08/15/14 1925 08/15/14 2049      DVT Prophylaxis: Eliquis  Code Status:  DNR-spoke with daughter and explained concept of DNR-agreeable.  Family Communication Daughter-see above  Procedures:  None  CONSULTS:  neurology  Time spent 40 minutes-which includes 50% of the time with face-to-face with patient/ family and coordinating care related to the above assessment and plan.  MEDICATIONS: Scheduled Meds: . apixaban  5 mg Oral BID  . ceFEPime (MAXIPIME) IV  2 g Intravenous Q24H  . furosemide  40 mg Intravenous BID  . insulin  aspart  0-9 Units Subcutaneous TID WC  . pantoprazole  40 mg Oral Daily  . potassium chloride  40 mEq Oral Daily  . pravastatin  20 mg Oral q1800  . predniSONE  10 mg Oral QAC breakfast  . tiotropium  18 mcg Inhalation Daily   Continuous Infusions:  PRN  Meds:.senna-docusate    PHYSICAL EXAM: Vital signs in last 24 hours: Filed Vitals:   08/16/14 2117 08/17/14 0106 08/17/14 0541 08/17/14 0743  BP: 114/65 142/75 115/66   Pulse: 77 82 75   Temp: 97.9 F (36.6 C) 97.5 F (36.4 C) 98.4 F (36.9 C)   TempSrc: Oral Oral Oral   Resp: 18 18 18    Height:      Weight:    102.541 kg (226 lb 1 oz)  SpO2: 100% 100% 100%     Weight change:  Filed Weights   08/15/14 2125 08/17/14 0743  Weight: 100.789 kg (222 lb 3.2 oz) 102.541 kg (226 lb 1 oz)   Body mass index is 36.5 kg/(m^2).   Gen Exam: Awake, dysarthric, follows some commands    Neck: Supple, No JVD.   Chest: B/L Clear anteriorly CVS: S1 S2 Regular, no murmurs.  Abdomen: soft, BS +, non tender, non distended.  Extremities: 3+ edema, lower extremities warm to touch. NeurologicB/L Lower ext 0/5, B/L Upper ext 2-3/5 -flexion contracture bilaterally Skin: No Rash.   Wounds: N/A.    Intake/Output from previous day:  Intake/Output Summary (Last 24 hours) at 08/17/14 0949 Last data filed at 08/17/14 0836  Gross per 24 hour  Intake    200 ml  Output    275 ml  Net    -75 ml     LAB RESULTS: CBC  Recent Labs Lab 08/15/14 1632  WBC 8.3  HGB 9.5*  HCT 30.9*  PLT 107*  MCV 95.1  MCH 29.2  MCHC 30.7  RDW 16.0*  LYMPHSABS 1.5  MONOABS 0.4  EOSABS 0.0  BASOSABS 0.0    Chemistries   Recent Labs Lab 08/15/14 1632 08/17/14 0744  NA 147 146  K 4.0 4.0  CL 105 107  CO2 29 26  GLUCOSE 137* 117*  BUN 25* 30*  CREATININE 1.59* 1.56*  CALCIUM 7.9* 7.8*  MG  --  2.3    CBG:  Recent Labs Lab 08/16/14 1147 08/16/14 1150 08/16/14 1703 08/16/14 2110 08/17/14 0648  GLUCAP 168* 143* 146* 136* 118*    GFR Estimated Creatinine Clearance: 35.9 mL/min (by C-G formula based on Cr of 1.56).  Coagulation profile No results for input(s): INR, PROTIME in the last 168 hours.  Cardiac Enzymes  Recent Labs Lab 08/15/14 2026 08/16/14 0750  TROPONINI 0.52*  0.43*    Invalid input(s): POCBNP No results for input(s): DDIMER in the last 72 hours. No results for input(s): HGBA1C in the last 72 hours. No results for input(s): CHOL, HDL, LDLCALC, TRIG, CHOLHDL, LDLDIRECT in the last 72 hours. No results for input(s): TSH, T4TOTAL, T3FREE, THYROIDAB in the last 72 hours.  Invalid input(s): FREET3 No results for input(s): VITAMINB12, FOLATE, FERRITIN, TIBC, IRON, RETICCTPCT in the last 72 hours. No results for input(s): LIPASE, AMYLASE in the last 72 hours.  Urine Studies No results for input(s): UHGB, CRYS in the last 72 hours.  Invalid input(s): UACOL, UAPR, USPG, UPH, UTP, UGL, UKET, UBIL, UNIT, UROB, ULEU, UEPI, UWBC, URBC, UBAC, CAST, UCOM, BILUA  MICROBIOLOGY: Recent Results (from the past 240 hour(s))  Urine culture     Status: None  Collection Time: 08/15/14  5:05 PM  Result Value Ref Range Status   Specimen Description URINE, CATHETERIZED  Final   Special Requests NONE  Final   Culture  Setup Time   Final    08/15/2014 19:30 Performed at [facility]   Final    >=100,000 COLONIES/ML Performed at [facility]    Culture   Final    Multiple bacterial morphotypes present, none predominant. Suggest appropriate recollection if clinically indicated. Performed at [facility]    Report Status 08/16/2014 FINAL  Final    RADIOLOGY STUDIES/RESULTS: Ct Head Wo Contrast  08/15/2014   CLINICAL DATA:  Prior stroke with unknown visual deficit, Pt son reports that at [place] pt had a "blank stare" and "seemed different" No known hx of dementia.  EXAM: CT HEAD WITHOUT CONTRAST  TECHNIQUE: Contiguous axial images were obtained from the base of the skull through the vertex without intravenous contrast.  COMPARISON:  MRI brain 07/04/2014  FINDINGS: There is no evidence of mass effect, midline shift, or extra-axial fluid collections. There is no evidence of a space-occupying lesion or intracranial hemorrhage.  There is no evidence of a cortical-based area of acute infarction. There is an old right parietal lobe infarct with encephalomalacia. There is generalized cerebral atrophy. There is periventricular white matter low attenuation likely secondary to microangiopathy.  The ventricles and sulci are appropriate for the patient's age. The basal cisterns are patent.  Visualized portions of the orbits are unremarkable. The visualized portions of the paranasal sinuses and mastoid air cells are unremarkable. Cerebrovascular atherosclerotic calcifications are noted.  The osseous structures are unremarkable.  IMPRESSION: 1. No acute intracranial pathology. 2. Chronic microvascular disease and cerebral atrophy.   Electronically Signed   By: Kathreen Devoid   On: 08/15/2014 19:21   Mr Brain Wo Contrast  08/16/2014   CLINICAL DATA:  Acute encephalopathy  EXAM: MRI HEAD WITHOUT CONTRAST  TECHNIQUE: Multiplanar, multiecho pulse sequences of the brain and surrounding structures were obtained without intravenous contrast.  COMPARISON:  CT 08/15/2014.  MRI 07/04/2014  FINDINGS: The patient was not able to complete the study. Axial diffusion, T2 , and FLAIR imaging was performed prior to terminating the study.  Recent acute infarct in the right occipital lobe on the prior MRI is resolving on diffusion. There are multiple new areas of acute infarct.  Multiple areas of restricted diffusion are present consistent with acute infarct involving the cerebellum bilaterally, right occipital lobe inferior to the previously identified infarct, right basal ganglia, and small areas of acute infarct in the frontal and parietal cortex bilaterally. These are present in multiple vascular distributions and are most consistent with emboli.  Advanced atrophy. Chronic infarcts in the occipital lobes bilaterally. Chronic microvascular ischemia in the white matter.  Image quality degraded by motion.  IMPRESSION: Numerous small areas of acute infarction  consistent with emboli.  Advanced atrophy.  Chronic ischemic changes.  The patient was not able to complete the study.   Electronically Signed   By: Franchot Gallo M.D.   On: 08/16/2014 10:34   Dg Chest Port 1 View  08/16/2014   CLINICAL DATA:  Shortness of breath  EXAM: PORTABLE CHEST - 1 VIEW  COMPARISON:  08/15/2014  FINDINGS: Chronic cardiomegaly. 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 This represents a large thyroid nodule on chest CT 03/28/2010. There are coarse interstitial opacities which appear similar to previous. No evidence for superimposed pneumonia or edema. No effusion  or pneumothorax.  IMPRESSION: 1. Fibrotic lung disease without acute superimposed finding. 2. Chronic right thyroid nodule with tracheal deviation.   Electronically Signed   By: Jorje Guild M.D.   On: 08/16/2014 03:49   Dg Chest Port 1 View  08/15/2014   CLINICAL DATA:  Altered mental status 1 day. Hypertension and atrial fibrillation.  EXAM: PORTABLE CHEST - 1 VIEW  COMPARISON:  07/04/2014 and 04/17/2013 as well as chest CT 04/10/2011  FINDINGS: Patient is rotated to the left. Lungs are hypoinflated with persistent bilateral coarse interstitial changes compatible with known emphysematous disease and fibrosis. There is mild increased density over the right midlung/hilar region which may be partially due to patient's rotation although cannot exclude developing infectious process. No evidence of effusion. Cardiomediastinal silhouette and remainder of the exam is unchanged.  IMPRESSION: Findings compatible patient's known emphysematous disease and fibrosis. There is slight increased density over the right hilar/ perihilar region which may be due to the degree of rotation although cannot exclude developing infectious process. Consider repeat frontal radiograph with better positioning versus PA and lateral film.   Electronically Signed   By: Marin Olp M.D.   On: 08/15/2014 16:47     Oren Binet, MD  [facility] Pager:[phone]  If 7PM-7AM, please contact night-coverage [email or web address] Password [email or web address] 08/17/2014, 9:49 AM   LOS: 2 days

## 2014-08-18 ENCOUNTER — Ambulatory Visit (HOSPITAL_COMMUNITY): Payer: Medicare Other

## 2014-08-18 LAB — BASIC METABOLIC PANEL
Anion gap: 14 (ref 5–15)
BUN: 32 mg/dL — AB (ref 6–23)
CALCIUM: 7.7 mg/dL — AB (ref 8.4–10.5)
CHLORIDE: 106 meq/L (ref 96–112)
CO2: 25 mEq/L (ref 19–32)
CREATININE: 1.81 mg/dL — AB (ref 0.50–1.10)
GFR calc Af Amer: 30 mL/min — ABNORMAL LOW (ref >90)
GFR calc non Af Amer: 26 mL/min — ABNORMAL LOW (ref >90)
Glucose, Bld: 139 mg/dL — ABNORMAL HIGH (ref 70–99)
Potassium: 4.4 mEq/L (ref 3.7–5.3)
Sodium: 145 mEq/L (ref 137–147)

## 2014-08-18 LAB — CBC
HEMATOCRIT: 31.1 % — AB (ref 36.0–46.0)
Hemoglobin: 9.5 g/dL — ABNORMAL LOW (ref 12.0–15.0)
MCH: 28.2 pg (ref 26.0–34.0)
MCHC: 30.5 g/dL (ref 30.0–36.0)
MCV: 92.3 fL (ref 78.0–100.0)
Platelets: 127 10*3/uL — ABNORMAL LOW (ref 150–400)
RBC: 3.37 MIL/uL — ABNORMAL LOW (ref 3.87–5.11)
RDW: 16.4 % — AB (ref 11.5–15.5)
WBC: 6.8 10*3/uL (ref 4.0–10.5)

## 2014-08-18 LAB — GLUCOSE, CAPILLARY
GLUCOSE-CAPILLARY: 132 mg/dL — AB (ref 70–99)
Glucose-Capillary: 127 mg/dL — ABNORMAL HIGH (ref 70–99)
Glucose-Capillary: 149 mg/dL — ABNORMAL HIGH (ref 70–99)
Glucose-Capillary: 132 mg/dL — ABNORMAL HIGH (ref 70–99)
Glucose-Capillary: 128 mg/dL — ABNORMAL HIGH (ref 70–99)

## 2014-08-18 LAB — CULTURE, BLOOD (ROUTINE X 2)

## 2014-08-18 MED ORDER — VANCOMYCIN HCL IN DEXTROSE 1-5 GM/200ML-% IV SOLN
1000.0000 mg | INTRAVENOUS | Status: DC
  Administered 2014-08-18: 1000 mg via INTRAVENOUS
  Filled 2014-08-18: qty 200

## 2014-08-18 NOTE — Progress Notes (Addendum)
ANTIBIOTIC CONSULT NOTE - FOLLOW UP  Pharmacy Consult for cefepime Indication: UTI  Allergies  Allergen Reactions  . Levofloxacin Nausea Only  . Ultram [Tramadol Hcl] Nausea Only    Patient Measurements: Height: 5\' 6"  (167.6 cm) Weight: 227 lb 12.8 oz (103.329 kg) IBW/kg (Calculated) : 59.3  Vital Signs: Temp: 97.9 F (36.6 C) (11/27 1018) Temp Source: Oral (11/27 1018) BP: 124/74 mmHg (11/27 1018) Pulse Rate: 81 (11/27 1018) Intake/Output from previous day: 11/26 0701 - 11/27 0700 In: 660 [P.O.:660] Out: 1000 [Urine:1000] Intake/Output from this shift:    Labs:  Recent Labs  08/15/14 1632 08/17/14 0744 08/18/14 0415  WBC 8.3  --  6.8  HGB 9.5*  --  9.5*  PLT 107*  --  127*  CREATININE 1.59* 1.56* 1.81*   Estimated Creatinine Clearance: 31.1 mL/min (by C-G formula based on Cr of 1.81). No results for input(s): VANCOTROUGH, VANCOPEAK, VANCORANDOM, GENTTROUGH, GENTPEAK, GENTRANDOM, TOBRATROUGH, TOBRAPEAK, TOBRARND, AMIKACINPEAK, AMIKACINTROU, AMIKACIN in the last 72 hours.   Microbiology: Recent Results (from the past 720 hour(s))  Culture, blood (routine x 2)     Status: None   Collection Time: 08/15/14  4:35 PM  Result Value Ref Range Status   Specimen Description BLOOD LEFT HAND  Final   Special Requests BOTTLES DRAWN AEROBIC AND ANAEROBIC 5CC  Final   Culture  Setup Time   Final    08/16/2014 00:54 Performed at [facility]   Final    DIPHTHEROIDS(CORYNEBACTERIUM SPECIES) Note: Standardized susceptibility testing for this organism is not available. Note: Gram Stain Report Called to,Read Back By and Verified With: SHAKENNA BASS ON 08/17/2014 AT 11:14P BY WILEJ Performed at [facility]    Report Status 08/18/2014 FINAL  Final  Culture, blood (routine x 2)     Status: None (Preliminary result)   Collection Time: 08/15/14  4:42 PM  Result Value Ref Range Status   Specimen Description BLOOD RIGHT HAND  Final   Special Requests  BOTTLES DRAWN AEROBIC AND ANAEROBIC 5CC  Final   Culture  Setup Time   Final    08/16/2014 00:54 Performed at [facility]    Culture   Final           BLOOD CULTURE RECEIVED NO GROWTH TO DATE CULTURE WILL BE HELD FOR 5 DAYS BEFORE ISSUING A FINAL NEGATIVE REPORT Performed at [facility]    Report Status PENDING  Incomplete  Urine culture     Status: None   Collection Time: 08/15/14  5:05 PM  Result Value Ref Range Status   Specimen Description URINE, CATHETERIZED  Final   Special Requests NONE  Final   Culture  Setup Time   Final    08/15/2014 19:30 Performed at [facility]   Final    >=100,000 COLONIES/ML Performed at [facility]    Culture   Final    Multiple bacterial morphotypes present, none predominant. Suggest appropriate recollection if clinically indicated. Performed at [facility]    Report Status 08/16/2014 FINAL  Final    Anti-infectives    Start     Dose/Rate Route Frequency Ordered Stop   08/18/14 0600  vancomycin (VANCOCIN) IVPB 1000 mg/200 mL premix  Status:  Discontinued     1,000 mg200 mL/hr over 60 Minutes Intravenous Every 48 hours 08/18/14 0016 08/18/14 1154   08/16/14 2000  ceFEPIme (MAXIPIME) 2 g in dextrose 5 % 50 mL IVPB  2 g100 mL/hr over 30 Minutes Intravenous Every 24 hours 08/15/14 1952     08/16/14 0400  vancomycin (VANCOCIN) 1,250 mg in sodium chloride 0.9 % 250 mL IVPB  Status:  Discontinued     1,250 mg166.7 mL/hr over 90 Minutes Intravenous Every 24 hours 08/16/14 0326 08/16/14 1720   08/15/14 1930  ceFEPIme (MAXIPIME) 2 g in dextrose 5 % 50 mL IVPB     2 g100 mL/hr over 30 Minutes Intravenous  Once 08/15/14 1925 08/15/14 2049      Assessment: 80 y/o female on day 4 cefepime for UTI. Urine culture showed >100K multiple bacterial morphotypes and blood culture grew 1 of 2 Diphtheroids which is a contaminant. She is afebrile and WBc are normal. SCr has trended up to 1.81 with est  CrCl ~ 31 ml/min.  Goal of Therapy:  Eradication of infection  Plan:  - Continue cefepime 2 g IV q24h - Consider narrow to ceftriaxone to complete therapy as no hx MDR UTIs - Monitor renal function - Discontinue vancomycin  Renold Genta, Pharm.D., BCPS Clinical Pharmacist Pager: [phone] 08/18/2014 12:03 PM

## 2014-08-18 NOTE — Progress Notes (Signed)
Talked to patient's daughter about home hospice choice, Catrina Fellenz ( daughter) chose [facility] [place]; Ileta Ofarrell is at work and cannot talk a lot about disposition plans and request the patient's son [name] be contacted for further information [phone]). Danton Sewer RN with [facility] called for the referral and she will see the patient todayAneta Mins [phone]

## 2014-08-18 NOTE — Progress Notes (Signed)
PATIENT DETAILS Name: [name] Age: 80 y.o. Sex: female Date of Birth: January 09, 1934 Admit Date: 08/15/2014 Admitting Physician Rise Patience, MD [id number] Jenny Reichmann, MD  Subjective: Awake and alert. Speech appears essentially unchanged. Answers questions appropriately  Assessment/Plan: Principal Problem:   Acute encephalopathy:secondary to acute CVA and UTI. EEG neg. Suspect back to baseline.  Active Problems:   Acute CVA: embolic given multiple areas involved in MRI Brain. Recent Echo/Carotids done-hence not repeated. Already on Eliquis, suspect getting a TEE would not change this management. On ASA as well-given overall frailty-suspect does not need both ASA and Eliquis. Will stop ASA.Unfortunately, very frail and with very poor health overall. Is nonambulatory, unable to bilateral lower extremities, has bilateral upper extremity flexion contractures and is just about able to lift hands a few inches. Suspect poor outcome irrespective of whether we change anticoagulation. Spoke with Dr. Leonie Man from neurology, he agrees that changing anticoagulation and subjecting this unfortunate patient to further procedures/investigations would not add any quality/longevity to her life. I did speak with the patient's daughter a few days back, current plans are to discharge home with hospice care. I have asked case management to offer home hospice choices.    Atrial fibrillation: currently appears to be sinus. Continue Eliquis-see above    Acute on CKD (chronic kidney disease), stage LZ:JQBHALPFXT slightly increased compared to usual baseline-likely secondary to diuresis. Monitor lytes periodically.    Volume overload: Multifactorial, probably mild acute on chronic diastolic heart failure and from hypoalbuminemia,still appears to have some extra volume, even though creatinine slightly elevated, will continue to push IV Lasix for one more day to see if we can get more volume off her.  overloaded.     Acute on Chronic Diastolic CHF:as above.    Minimally Elevated troponin: suspect demand ischemia. Not a candidate for further work up.Treat Medically    Sepsis with Hypotension:Secondary to UTI, BP now stable. Stop Hydrocortisone and IVF.    KWI:OXBDZHGD, looks non toxic.  Continue IV Cefepime.Urine cs multiple bacterial morphotype's, will empirically treat for 5-7 days and not repeat culture   ? Gram positive rod bacteremia: Suspect a contamination. Stop vancomycin. Monitor and await final  results.    Diabetes mellitus type 2, controlled:CBG's controlled with SSI. Resume oral hypoglycemic agents on discharge.    COPD/Interstitial lung disease:continue with chronic prednisone    Thrombocytopenia:mild, improving. Repeat CBC periodically    Palliative Care:very poor overall prognoses. Bed bound, poor quality of life. Spoke with Daughter on 11/25-Annie Arley Garant [phone]-family interested in home hospice care. I spoke to her about DNR, explained that she is not a candidate for aggressive care, Ms Hocutt agreed with DNR order. See above.  Disposition: Remain inpatient-Home with Hospice on discharge.  Antibiotics:  See below.   Anti-infectives    Start     Dose/Rate Route Frequency Ordered Stop   08/18/14 0600  vancomycin (VANCOCIN) IVPB 1000 mg/200 mL premix     1,000 mg200 mL/hr over 60 Minutes Intravenous Every 48 hours 08/18/14 0016     08/16/14 2000  ceFEPIme (MAXIPIME) 2 g in dextrose 5 % 50 mL IVPB     2 g100 mL/hr over 30 Minutes Intravenous Every 24 hours 08/15/14 1952     08/16/14 0400  vancomycin (VANCOCIN) 1,250 mg in sodium chloride 0.9 % 250 mL IVPB  Status:  Discontinued     1,250 mg166.7 mL/hr over 90 Minutes Intravenous Every 24 hours 08/16/14 0326 08/16/14  1720   08/15/14 1930  ceFEPIme (MAXIPIME) 2 g in dextrose 5 % 50 mL IVPB     2 g100 mL/hr over 30 Minutes Intravenous  Once 08/15/14 1925 08/15/14 2049      DVT Prophylaxis: Eliquis  Code Status:   DNR-spoke with daughter on 11/25 and explained concept of DNR-agreeable.  Family Communication Daughter-see above  Procedures:  None  CONSULTS:  neurology  MEDICATIONS: Scheduled Meds: . apixaban  5 mg Oral BID  . ceFEPime (MAXIPIME) IV  2 g Intravenous Q24H  . furosemide  60 mg Intravenous BID  . insulin aspart  0-9 Units Subcutaneous TID WC  . pantoprazole  40 mg Oral Daily  . potassium chloride  40 mEq Oral Daily  . pravastatin  20 mg Oral q1800  . predniSONE  10 mg Oral QAC breakfast  . senna-docusate  2 tablet Oral QHS  . tiotropium  18 mcg Inhalation Daily  . vancomycin  1,000 mg Intravenous Q48H   Continuous Infusions:  PRN Meds:.morphine CONCENTRATE    PHYSICAL EXAM: Vital signs in last 24 hours: Filed Vitals:   08/17/14 2154 08/18/14 0127 08/18/14 0456 08/18/14 0525  BP: 135/63 118/65  121/59  Pulse: 79 70  75  Temp: 98.3 F (36.8 C) 97.5 F (36.4 C)  97.8 F (36.6 C)  TempSrc: Axillary Oral  Oral  Resp: 19 17  17   Height:      Weight:   103.329 kg (227 lb 12.8 oz)   SpO2: 100% 100%  100%    Weight change:  Filed Weights   08/15/14 2125 08/17/14 0743 08/18/14 0456  Weight: 100.789 kg (222 lb 3.2 oz) 102.541 kg (226 lb 1 oz) 103.329 kg (227 lb 12.8 oz)   Body mass index is 36.79 kg/(m^2).   Gen Exam: Awake, dysarthric, follows commands    Neck: Supple, No JVD.   Chest: B/L Clear anteriorly-with no rales or rhonchi CVS: S1 S2 Regular, no murmurs.  Abdomen: soft, BS +, non tender, non distended.  Extremities: 3+ edema, lower extremities warm to touch. NeurologicB/L Lower ext 0/5, B/L Upper ext 2-3/5 -flexion contracture bilaterally Skin: No Rash.   Wounds: N/A.    Intake/Output from previous day:  Intake/Output Summary (Last 24 hours) at 08/18/14 0930 Last data filed at 08/18/14 0700  Gross per 24 hour  Intake    460 ml  Output   1000 ml  Net   -540 ml     LAB RESULTS: CBC  Recent Labs Lab 08/15/14 1632 08/18/14 0415  WBC 8.3  6.8  HGB 9.5* 9.5*  HCT 30.9* 31.1*  PLT 107* 127*  MCV 95.1 92.3  MCH 29.2 28.2  MCHC 30.7 30.5  RDW 16.0* 16.4*  LYMPHSABS 1.5  --   MONOABS 0.4  --   EOSABS 0.0  --   BASOSABS 0.0  --     Chemistries   Recent Labs Lab 08/15/14 1632 08/17/14 0744 08/18/14 0415  NA 147 146 145  K 4.0 4.0 4.4  CL 105 107 106  CO2 29 26 25   GLUCOSE 137* 117* 139*  BUN 25* 30* 32*  CREATININE 1.59* 1.56* 1.81*  CALCIUM 7.9* 7.8* 7.7*  MG  --  2.3  --     CBG:  Recent Labs Lab 08/17/14 0648 08/17/14 1122 08/17/14 1615 08/17/14 2153 08/18/14 0620  GLUCAP 118* 154* 151* 180* 127*    GFR Estimated Creatinine Clearance: 31.1 mL/min (by C-G formula based on Cr of 1.81).  Coagulation profile No results  for input(s): INR, PROTIME in the last 168 hours.  Cardiac Enzymes  Recent Labs Lab 08/15/14 2026 08/16/14 0750  TROPONINI 0.52* 0.43*    Invalid input(s): POCBNP No results for input(s): DDIMER in the last 72 hours. No results for input(s): HGBA1C in the last 72 hours. No results for input(s): CHOL, HDL, LDLCALC, TRIG, CHOLHDL, LDLDIRECT in the last 72 hours. No results for input(s): TSH, T4TOTAL, T3FREE, THYROIDAB in the last 72 hours.  Invalid input(s): FREET3 No results for input(s): VITAMINB12, FOLATE, FERRITIN, TIBC, IRON, RETICCTPCT in the last 72 hours. No results for input(s): LIPASE, AMYLASE in the last 72 hours.  Urine Studies No results for input(s): UHGB, CRYS in the last 72 hours.  Invalid input(s): UACOL, UAPR, USPG, UPH, UTP, UGL, UKET, UBIL, UNIT, UROB, ULEU, UEPI, UWBC, URBC, UBAC, CAST, UCOM, BILUA  MICROBIOLOGY: Recent Results (from the past 240 hour(s))  Culture, blood (routine x 2)     Status: None (Preliminary result)   Collection Time: 08/15/14  4:35 PM  Result Value Ref Range Status   Specimen Description BLOOD LEFT HAND  Final   Special Requests BOTTLES DRAWN AEROBIC AND ANAEROBIC 5CC  Final   Culture  Setup Time   Final    08/16/2014  00:54 Performed at [facility]   Final    [place] Performed at [facility]    Report Status PENDING  Incomplete  Culture, blood (routine x 2)     Status: None (Preliminary result)   Collection Time: 08/15/14  4:42 PM  Result Value Ref Range Status   Specimen Description BLOOD RIGHT HAND  Final   Special Requests BOTTLES DRAWN AEROBIC AND ANAEROBIC 5CC  Final   Culture  Setup Time   Final    08/16/2014 00:54 Performed at [facility]    Culture   Final           BLOOD CULTURE RECEIVED NO GROWTH TO DATE CULTURE WILL BE HELD FOR 5 DAYS BEFORE ISSUING A FINAL NEGATIVE REPORT Performed at [facility]    Report Status PENDING  Incomplete  Urine culture     Status: None   Collection Time: 08/15/14  5:05 PM  Result Value Ref Range Status   Specimen Description URINE, CATHETERIZED  Final   Special Requests NONE  Final   Culture  Setup Time   Final    08/15/2014 19:30 Performed at [facility]   Final    >=100,000 COLONIES/ML Performed at [facility]    Culture   Final    Multiple bacterial morphotypes present, none predominant. Suggest appropriate recollection if clinically indicated. Performed at [facility]    Report Status 08/16/2014 FINAL  Final    RADIOLOGY STUDIES/RESULTS: Ct Head Wo Contrast  08/15/2014   CLINICAL DATA:  Prior stroke with unknown visual deficit, Pt son reports that at [place] pt had a "blank stare" and "seemed different" No known hx of dementia.  EXAM: CT HEAD WITHOUT CONTRAST  TECHNIQUE: Contiguous axial images were obtained from the base of the skull through the vertex without intravenous contrast.  COMPARISON:  MRI brain 07/04/2014  FINDINGS: There is no evidence of mass effect, midline shift, or extra-axial fluid collections. There is no evidence of a space-occupying lesion or intracranial hemorrhage. There is no evidence of a cortical-based area of acute  infarction. There is an old right parietal lobe infarct with encephalomalacia. There is generalized cerebral  atrophy. There is periventricular white matter low attenuation likely secondary to microangiopathy.  The ventricles and sulci are appropriate for the patient's age. The basal cisterns are patent.  Visualized portions of the orbits are unremarkable. The visualized portions of the paranasal sinuses and mastoid air cells are unremarkable. Cerebrovascular atherosclerotic calcifications are noted.  The osseous structures are unremarkable.  IMPRESSION: 1. No acute intracranial pathology. 2. Chronic microvascular disease and cerebral atrophy.   Electronically Signed   By: Kathreen Devoid   On: 08/15/2014 19:21   Mr Brain Wo Contrast  08/16/2014   CLINICAL DATA:  Acute encephalopathy  EXAM: MRI HEAD WITHOUT CONTRAST  TECHNIQUE: Multiplanar, multiecho pulse sequences of the brain and surrounding structures were obtained without intravenous contrast.  COMPARISON:  CT 08/15/2014.  MRI 07/04/2014  FINDINGS: The patient was not able to complete the study. Axial diffusion, T2 , and FLAIR imaging was performed prior to terminating the study.  Recent acute infarct in the right occipital lobe on the prior MRI is resolving on diffusion. There are multiple new areas of acute infarct.  Multiple areas of restricted diffusion are present consistent with acute infarct involving the cerebellum bilaterally, right occipital lobe inferior to the previously identified infarct, right basal ganglia, and small areas of acute infarct in the frontal and parietal cortex bilaterally. These are present in multiple vascular distributions and are most consistent with emboli.  Advanced atrophy. Chronic infarcts in the occipital lobes bilaterally. Chronic microvascular ischemia in the white matter.  Image quality degraded by motion.  IMPRESSION: Numerous small areas of acute infarction consistent with emboli.  Advanced atrophy.  Chronic ischemic  changes.  The patient was not able to complete the study.   Electronically Signed   By: Franchot Gallo M.D.   On: 08/16/2014 10:34   Dg Chest Port 1 View  08/16/2014   CLINICAL DATA:  Shortness of breath  EXAM: PORTABLE CHEST - 1 VIEW  COMPARISON:  08/15/2014  FINDINGS: Chronic cardiomegaly. 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 This represents a large thyroid nodule on chest CT 03/28/2010. There are coarse interstitial opacities which appear similar to previous. No evidence for superimposed pneumonia or edema. No effusion or pneumothorax.  IMPRESSION: 1. Fibrotic lung disease without acute superimposed finding. 2. Chronic right thyroid nodule with tracheal deviation.   Electronically Signed   By: Jorje Guild M.D.   On: 08/16/2014 03:49   Dg Chest Port 1 View  08/15/2014   CLINICAL DATA:  Altered mental status 1 day. Hypertension and atrial fibrillation.  EXAM: PORTABLE CHEST - 1 VIEW  COMPARISON:  07/04/2014 and 04/17/2013 as well as chest CT 04/10/2011  FINDINGS: Patient is rotated to the left. Lungs are hypoinflated with persistent bilateral coarse interstitial changes compatible with known emphysematous disease and fibrosis. There is mild increased density over the right midlung/hilar region which may be partially due to patient's rotation although cannot exclude developing infectious process. No evidence of effusion. Cardiomediastinal silhouette and remainder of the exam is unchanged.  IMPRESSION: Findings compatible patient's known emphysematous disease and fibrosis. There is slight increased density over the right hilar/ perihilar region which may be due to the degree of rotation although cannot exclude developing infectious process. Consider repeat frontal radiograph with better positioning versus PA and lateral film.   Electronically Signed   By: Marin Olp M.D.   On: 08/15/2014 16:47    Oren Binet, MD  [facility] Pager:[phone]  If  7PM-7AM, please contact night-coverage [email or web address] Password [facility] 08/18/2014,  9:30 AM   LOS: 3 days

## 2014-08-18 NOTE — Progress Notes (Signed)
STROKE TEAM PROGRESS NOTE   HISTORY [name] is an 80 y.o. female with a history of a stroke last month who had admission prompted by her son. It seems that she was having starring spells. They could last for hours and then resolve spontaneously. The patient is unaware of these spells. In October patient diagnosed with a right occipital infarct. Was on ASA at admission but with history of afib and low risk of falls due to being wheelchair bound patient was changed to Apixaban. Unable to determine last known well. Patient was not administered TPA secondary to Patient on Apixaban, unknown LKW, recent infarct within the past 90 days. She was admitted for further evaluation and treatment.   SUBJECTIVE (INTERVAL HISTORY)  Patient sitting up in bed being fed by the nurse. She had an EEG on 08/16/14 which did not  show any epileptiform activity hence repeat EEG was canceled   OBJECTIVE Temp:  [97.5 F (36.4 C)-98.4 F (36.9 C)] 97.9 F (36.6 C) (11/27 1018) Pulse Rate:  [70-89] 81 (11/27 1018) Cardiac Rhythm:  [-] Normal sinus rhythm (11/26 2050) Resp:  [17-19] 18 (11/27 1018) BP: (112-135)/(59-74) 124/74 mmHg (11/27 1018) SpO2:  [100 %] 100 % (11/27 1018) Weight:  [227 lb 12.8 oz (103.329 kg)] 227 lb 12.8 oz (103.329 kg) (11/27 0456)   Recent Labs Lab 08/17/14 0648 08/17/14 1122 08/17/14 1615 08/17/14 2153 08/18/14 0620  GLUCAP 118* 154* 151* 180* 127*    Recent Labs Lab 08/15/14 1632 08/17/14 0744 08/18/14 0415  NA 147 146 145  K 4.0 4.0 4.4  CL 105 107 106  CO2 29 26 25   GLUCOSE 137* 117* 139*  BUN 25* 30* 32*  CREATININE 1.59* 1.56* 1.81*  CALCIUM 7.9* 7.8* 7.7*  MG  --  2.3  --     Recent Labs Lab 08/15/14 1632  AST 18  ALT 12  ALKPHOS 75  BILITOT 0.5  PROT 5.6*  ALBUMIN 2.2*    Recent Labs Lab 08/15/14 1632 08/18/14 0415  WBC 8.3 6.8  NEUTROABS 6.4  --   HGB 9.5* 9.5*  HCT 30.9* 31.1*  MCV 95.1 92.3  PLT 107* 127*    Recent Labs Lab  08/15/14 2026 08/16/14 0750  TROPONINI 0.52* 0.43*   No results for input(s): LABPROT, INR in the last 72 hours.  Recent Labs  08/15/14 1705  COLORURINE YELLOW  LABSPEC 1.020  PHURINE 5.0  GLUCOSEU NEGATIVE  HGBUR SMALL*  BILIRUBINUR SMALL*  KETONESUR 15*  PROTEINUR 30*  UROBILINOGEN 1.0  NITRITE NEGATIVE  LEUKOCYTESUR MODERATE*       Component Value Date/Time   CHOL 126 07/05/2014 1520   TRIG 152* 07/05/2014 1520   HDL 49 07/05/2014 1520   CHOLHDL 2.6 07/05/2014 1520   VLDL 30 07/05/2014 1520   LDLCALC 47 07/05/2014 1520   Lab Results  Component Value Date   HGBA1C 7.6* 03/10/2014   No results found for: LABOPIA, COCAINSCRNUR, LABBENZ, AMPHETMU, THCU, LABBARB  No results for input(s): ETH in the last 168 hours.  No results found.   PHYSICAL EXAM Frail elderly lady  Not in distress.Awake alert. Afebrile. Head is nontraumatic. Neck is supple without bruit. Hearing is normal. Cardiac exam no murmur or gallop. Lungs are clear to auscultation. Distal pulses are well felt. Neurological Exam :  Mental Status: Alert, oriented to name but not place or time. Speech fluent without evidence of aphasia but tends to stick to short sentences. Follows some commands well but seems to have difficulty  understanding others.  Cranial Nerves: II: Discs flat bilaterally; Patient does not blink to bilateral confrontation, pupils unreactive to light and accommodation III,IV, VI: ptosis not present, right gaze preference. With oculocephalic maneuver unable to get eyes beyond midline V,VII: smile symmetric, facial light touch sensation normal bilaterally VIII: hearing normal bilaterally IX,X: gag reflex present XI: bilateral shoulder shrug XII: midline tongue extension Motor: Patient with increased tone throughout, right greater than left. Able to lift arms a few degrees off  her chest (arms in flexed  Contracture position). Unable to lift the legs off the bed but does wiggle her  toes (right greater than left) Sensory: Pinprick and light touch intact throughout, bilaterally Deep Tendon Reflexes: 2+ in the upper extremities and absent in the lower extremities Plantars: Right: muteLeft: mute Cerebellar: Unable to perform   ASSESSMENT/PLAN [name] is a 80 y.o. female with history of stroke in October presenting with a staring spell. She did not receive IV t-PA due to stroke with in the past 90 days, on apixaban and unknown last known well.   Stroke:  Bilateral small embolic infarcts secondary to atrial fibrillation while on Eliquis   MRI  Bilateral small embolic infarcts  No need to repeat further stroke workup  Eliquis for VTE prophylaxis  Diet heart healthy/carb modified thin liquids  Ongoing aggressive stroke risk factor management  Therapy recommendations:  No follow-up physical therapy recommended.  Continue Eliquis at discharge  Disposition: Hospice and [place];     Possible seizure  Check EEG  Atrial Fibrillation  Home meds:  apixaban continued in the hospital   Hypertension  Stable  Hyperlipidemia  Home meds:  Pravachol 40 resumed in hospital  LDL 47 in October, goal < 70  Continue statin at discharge  Diabetes 2  HgbA1c 7.6 in June, goal < 7.0  Other Stroke Risk Factors  Advanced age  Obesity, Body mass index is 36.79 kg/(m^2).   Hx stroke/TIA - TIA 2008, right PCA and bilateral embolic infarcts October 2015 secondary to atrial fibrillation  Other Active Problems  COPD on prednisone  Chronic diastolic heart failure on Lasix  Hospital day # [place] PA-C [facility] Pager [phone] 08/18/2014, 11:06 AM   I have personally examined this patient, reviewed notes, independently viewed imaging studies, participated in medical decision making and plan of care. I have made any additions or clarifications directly to the above note.  Agree with note above. She has had multiple small embolic strokes despite anticoagulation with apixaban and now have consulted hospice and palliative care which would be appropriate. Stroke team will sign off, to call for questions.  Antony Contras, MD  To contact Stroke Continuity provider, please refer to [email or web address] After hours, contact General Neurology

## 2014-08-18 NOTE — Progress Notes (Signed)
ANTIBIOTIC CONSULT NOTE - INITIAL  Pharmacy Consult for Vancomycin  Indication: bacteremia  Allergies  Allergen Reactions  . Levofloxacin Nausea Only  . Ultram [Tramadol Hcl] Nausea Only    Patient Measurements: Height: 5\' 6"  (167.6 cm) Weight: 226 lb 1 oz (102.541 kg) IBW/kg (Calculated) : 59.3 Adjusted Body Weight: 75 kg  Vital Signs: Temp: 98.3 F (36.8 C) (11/26 2154) Temp Source: Axillary (11/26 2154) BP: 135/63 mmHg (11/26 2154) Pulse Rate: 79 (11/26 2154) Intake/Output from previous day: 11/26 0701 - 11/27 0700 In: 540 [P.O.:540] Out: -  Intake/Output from this shift:    Labs:  Recent Labs  08/15/14 1632 08/17/14 0744  WBC 8.3  --   HGB 9.5*  --   PLT 107*  --   CREATININE 1.59* 1.56*   Estimated Creatinine Clearance: 35.9 mL/min (by C-G formula based on Cr of 1.56). No results for input(s): VANCOTROUGH, VANCOPEAK, VANCORANDOM, GENTTROUGH, GENTPEAK, GENTRANDOM, TOBRATROUGH, TOBRAPEAK, TOBRARND, AMIKACINPEAK, AMIKACINTROU, AMIKACIN in the last 72 hours.   Microbiology: Recent Results (from the past 720 hour(s))  Culture, blood (routine x 2)     Status: None (Preliminary result)   Collection Time: 08/15/14  4:35 PM  Result Value Ref Range Status   Specimen Description BLOOD LEFT HAND  Final   Special Requests BOTTLES DRAWN AEROBIC AND ANAEROBIC 5CC  Final   Culture  Setup Time   Final    08/16/2014 00:54 Performed at [place]   Final    [place] Performed at [facility]    Report Status PENDING  Incomplete  Culture, blood (routine x 2)     Status: None (Preliminary result)   Collection Time: 08/15/14  4:42 PM  Result Value Ref Range Status   Specimen Description BLOOD RIGHT HAND  Final   Special Requests BOTTLES DRAWN AEROBIC AND ANAEROBIC 5CC  Final   Culture  Setup Time   Final    08/16/2014 00:54 Performed at [facility]    Culture   Final           BLOOD CULTURE RECEIVED NO GROWTH TO DATE  CULTURE WILL BE HELD FOR 5 DAYS BEFORE ISSUING A FINAL NEGATIVE REPORT Performed at [facility]    Report Status PENDING  Incomplete  Urine culture     Status: None   Collection Time: 08/15/14  5:05 PM  Result Value Ref Range Status   Specimen Description URINE, CATHETERIZED  Final   Special Requests NONE  Final   Culture  Setup Time   Final    08/15/2014 19:30 Performed at [place]   Final    >=100,000 COLONIES/ML Performed at [facility]    Culture   Final    Multiple bacterial morphotypes present, none predominant. Suggest appropriate recollection if clinically indicated. Performed at [facility]    Report Status 08/16/2014 FINAL  Final    Medical History: Past Medical History  Diagnosis Date  . GERD 06/03/2007  . HYPERTENSION 06/03/2007  . OSTEOARTHRITIS 06/03/2007  . PEPTIC ULCER DISEASE 06/03/2007  . TRANSIENT ISCHEMIC ATTACK, HX OF 06/03/2007  . CONGESTIVE HEART FAILURE 06/03/2007    -2d echo 6/72/09 mild diastolic dysfunction, nl LA, RV  . DIABETES MELLITUS, TYPE II 06/03/2007  . LOW BACK PAIN 06/03/2007  . COPD 06/03/2007    Dr Wende Mott  . DEPRESSION 06/03/2007  . 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 Atrial fibrillation 08/10/2007  .  HEMORRHOIDS 08/08/2009  . CONSTIPATION, INTERMITTENT 02/15/2010  . DYSPNEA/SHORTNESS OF BREATH 06/09/2008  . INTERSTITIAL LUNG DISEASE 08/10/2007    -onset 12/03 with definite steroid responsive component -PFT's October 11, 2009: VC 67%, no airflow obstruction DLC0 14%  . Allergic rhinitis   . Diverticulosis 05/11/2011  . Goiter 05/11/2011  . Shingles 05/11/2011    Medications:  Scheduled:  . apixaban  5 mg Oral BID  . ceFEPime (MAXIPIME) IV  2 g Intravenous Q24H  . furosemide  60 mg Intravenous BID  . insulin aspart  0-9 Units Subcutaneous TID WC  . pantoprazole  40 mg Oral Daily  . potassium chloride  40 mEq Oral Daily  . pravastatin  20 mg Oral q1800  .  predniSONE  10 mg Oral QAC breakfast  . senna-docusate  2 tablet Oral QHS  . tiotropium  18 mcg Inhalation Daily   Assessment: 80 yo female with 1/2 blood cultures growing GPR for empiric antibiotics.  Vancomycin 1250 mg IV given 11/25 at 0400  Goal of Therapy:  Vancomycin trough level 15-20 mcg/ml  Plan:  Vancomycin 1 g IV q48h, next dose at 0600  [name], [place] 08/18/2014,12:10 AM

## 2014-08-18 NOTE — Plan of Care (Signed)
Problem: Discharge/Transitional Outcomes Goal: If vent dependent, trach in place Outcome: Not Applicable Date Met:  01/29/70

## 2014-08-18 NOTE — Progress Notes (Addendum)
Notified by [facility] of family choice for [facility]) services after discharge.  Patient information reviewed with Dr. Alferd Patee, [place] Director, pt is hospice eligible with Dx: CVA.  Spoke with patient's son Percival Spanish [phone]), initiated education related to hospice services, philosophy and team approach to care with good understanding voiced by [facility]. He stated his understanding of his mother's limited prognosis and the families desire to pursue comfort. Address confirmed correct on hospital face sheet: [place] D[facility] discussed with Percival Spanish and the patient currently has the following in the home: Hospital bed, oxygen, wheel chair and hoyer lift.He believes it is with [facility] [place].  [facility] DME manager alerted.  Patient's PCP is Dr. Cathlean Cower, [facility] referral specialist to contact for hospice order. Pharmacy: [facility] on [address]. Per conversation with attending physician Dr. Sloan Leiter, pt is for discharge tomorrow via non emergent transport Pt will need a GOLD DNR form to accompany her.  She will also need a prescription for liquid morphine 20 mg/ml, dose 5mg  Q 2 hrs PRN pain/dyspnea dispense 79ml **Completed discharge summary will need to be faxed to [facility] referral center @ [phone] when final. Please notify [facility] when patient is ready to leave the unit at [phone]. Above information shared with [facility]. Please call with any questions or concerns. Thank you. Flo Shanks, RN 08/18/2014 1:50pm [facility] Liaison [phone]

## 2014-08-18 NOTE — Progress Notes (Signed)
SLP Cancellation Note  Patient Details Name: [name] MRN: [id number] DOB: 05-Jul-1934   Cancelled treatment:       Reason Eval/Treat Not Completed: SLP screened, no needs identified, will sign off. Chart reviewed, and note son's wish for return home with hospice care. SLP contacted son, Sarena Jezek, to determine baseline level of cognitive-linguistic function. At this time he is politely declining SLP intervention. SLP to sign off, please re-order should GOC or pt/family wishes change.    Germain Osgood, M.A. CCC-SLP [phone]  Germain Osgood 08/18/2014, 11:16 AM

## 2014-08-19 DIAGNOSIS — I482 Chronic atrial fibrillation: Secondary | ICD-10-CM

## 2014-08-19 LAB — GLUCOSE, CAPILLARY
GLUCOSE-CAPILLARY: 87 mg/dL (ref 70–99)
GLUCOSE-CAPILLARY: 129 mg/dL — AB (ref 70–99)

## 2014-08-19 MED ORDER — POTASSIUM CHLORIDE CRYS ER 20 MEQ PO TBCR: 40.0000 meq | EXTENDED_RELEASE_TABLET | Freq: Every day | ORAL | Status: AC

## 2014-08-19 MED ORDER — SENNOSIDES-DOCUSATE SODIUM 8.6-50 MG PO TABS: 2.0000 | ORAL_TABLET | Freq: Every day | ORAL | Status: AC

## 2014-08-19 MED ORDER — MORPHINE SULFATE (CONCENTRATE) 10 MG/0.5ML PO SOLN: 5.0000 mg | ORAL | Status: AC | PRN

## 2014-08-19 MED ORDER — FUROSEMIDE 40 MG PO TABS: 40.0000 mg | ORAL_TABLET | Freq: Two times a day (BID) | ORAL | Status: AC

## 2014-08-19 NOTE — Care Management Note (Signed)
    Page 1 of 1   08/19/2014     4:44:21 PM CARE MANAGEMENT NOTE 08/19/2014  Patient:  [name], [name]   Account Number:  [id number]  Date Initiated:  08/18/2014  Documentation initiated by:  Olga Coaster  Subjective/Objective Assessment:   ADMITTED WITH STROKE     Action/Plan:   HOME WITH HOSPICE CARE   Anticipated DC Date:  08/19/2014   Anticipated DC Plan:  [place]  CM consult      Choice offered to / List presented to:             Status of service:  Completed, signed off Medicare Important Message given?  YES (If response is "NO", the following Medicare IM given date fields will be blank) Date Medicare IM given:  08/18/2014 Medicare IM given by:  Olga Coaster Date Additional Medicare IM given:   Additional Medicare IM given by:    Discharge Disposition:  [place]  Per UR Regulation:  Reviewed for med. necessity/level of care/duration of stay  If discussed at [place] of Stay Meetings, dates discussed:    Comments:  08/19/14 16:00 CM received call from [place] inquiring as to whether hospice is set up and can he arrange transport [place].  CM called [facility] who state Admission has been arranged for tomorow moring (08/20/14).  Cm called CSW back to explain bc pt has not been admitted, [facility] would not be available for support tonight.  CSW states he will call the family and explain this and if they still want to have pt come home he is going to arrange.  CM faxed DC summary to [place] per [facility] request.  No other CM needs were communicated.  Mariane Masters, BSN, [place] [phone].  08/18/2014- Talked to patient's daughter about home hospice choice, [name] ( daughter) chose [facility] [place]; [name] is at work and cannot talk a lot about disposition plans and request the patient's son [name] be contacted for further information [phone]). Danton Sewer RN with [facility] called for the referral and she will  see the patient todayAneta Mins [phone]

## 2014-08-19 NOTE — Clinical Social Work Note (Addendum)
Per RNCM, patient cannot be discharged to home today because [facility] plans for admission of patient tomorrow. CSW notes documentation from RN liaison with [facility] that [facility] was aware of patient's discharge for today. Patient contacted family and explained that [place] states they will not be admitting the patient until 08/20/14. Family states that they are ready for the patient to be transported home, with full understanding that [facility] will not be admitting the patient until tomorrow. CSW has requested next available transport for patient. Per Percival Spanish, patient was O2 dependent prior to hospitalization and has O2 equipment in place at home. CSW signing off at this time.    Liz Beach MSW, [place], [place], [phone]

## 2014-08-19 NOTE — Discharge Summary (Signed)
PATIENT DETAILS Name: [name] Age: 80 y.o. Sex: female Date of Birth: September 01, 1934 MRN: [id number]. Admitting Physician: Rise Patience, MD PRF:[id number] Jenny Reichmann, MD  Admit Date: 08/15/2014 Discharge date: 08/19/2014  Recommendations for Outpatient Follow-up:  1. Being discharged home with hospice-please continue to optimize medications. 2. If possible, please check electrolytes periodically, patient on Lasix and Eliquis 3. If possible please check CBC periodically, patient on Eliquis  PRIMARY DISCHARGE DIAGNOSIS:  Principal Problem:   Acute encephalopathy Active Problems:   Atrial fibrillation   CKD (chronic kidney disease), stage IV   Chronic diastolic heart failure   Sepsis   Diabetes mellitus type 2, controlled   Cerebral embolism with cerebral infarction      PAST MEDICAL HISTORY: Past Medical History  Diagnosis Date  . GERD 06/03/2007  . HYPERTENSION 06/03/2007  . OSTEOARTHRITIS 06/03/2007  . PEPTIC ULCER DISEASE 06/03/2007  . TRANSIENT ISCHEMIC ATTACK, HX OF 06/03/2007  . CONGESTIVE HEART FAILURE 06/03/2007    -2d echo 6/65/99 mild diastolic dysfunction, nl LA, RV  . DIABETES MELLITUS, TYPE II 06/03/2007  . LOW BACK PAIN 06/03/2007  . COPD 06/03/2007    Dr Wende Mott  . DEPRESSION 06/03/2007  . 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 Atrial fibrillation 08/10/2007  . HEMORRHOIDS 08/08/2009  . CONSTIPATION, INTERMITTENT 02/15/2010  . DYSPNEA/SHORTNESS OF BREATH 06/09/2008  . INTERSTITIAL LUNG DISEASE 08/10/2007    -onset 12/03 with definite steroid responsive component -PFT's October 11, 2009: VC 67%, no airflow obstruction DLC0 14%  . Allergic rhinitis   . Diverticulosis 05/11/2011  . Goiter 05/11/2011  . Shingles 05/11/2011    DISCHARGE MEDICATIONS: Current Discharge Medication List    START taking these medications   Details  furosemide (LASIX) 40 MG tablet Take 1 tablet (40 mg total) by mouth 2 (two) times daily. Qty: 60 tablet, Refills: 0      Morphine Sulfate (MORPHINE CONCENTRATE) 10 MG/0.5ML SOLN concentrated solution Take 0.25 mLs (5 mg total) by mouth every 4 (four) hours as needed for severe pain. Qty: 30 mL, Refills: 0    potassium chloride SA (K-DUR,KLOR-CON) 20 MEQ tablet Take 2 tablets (40 mEq total) by mouth daily. Qty: 60 tablet, Refills: 0    senna-docusate (SENOKOT-S) 8.6-50 MG per tablet Take 2 tablets by mouth at bedtime. Qty: 120 tablet, Refills: 0      CONTINUE these medications which have NOT CHANGED   Details  apixaban (ELIQUIS) 5 MG TABS tablet Take 1 tablet (5 mg total) by mouth 2 (two) times daily. Qty: 60 tablet, Refills: 1    linagliptin (TRADJENTA) 5 MG TABS tablet Take 5 mg by mouth daily.    lovastatin (MEVACOR) 40 MG tablet Take 1 tablet (40 mg total) by mouth at bedtime. Qty: 90 tablet, Refills: 3    nebivolol (BYSTOLIC) 10 MG tablet Take 10 mg by mouth every morning.    omeprazole (PRILOSEC) 20 MG capsule Take 20 mg by mouth every morning.    predniSONE (DELTASONE) 10 MG tablet take 1 tablet by mouth once daily Qty: 30 tablet, Refills: 3    tiotropium (SPIRIVA) 18 MCG inhalation capsule Place 18 mcg into inhaler and inhale daily.        ALLERGIES:   Allergies  Allergen Reactions  . Levofloxacin Nausea Only  . Ultram [Tramadol Hcl] Nausea Only    BRIEF HPI:  See H&P, Labs, Consult and Test reports for all details in brief, patient is an unfortunate 80 year old female with recent history of CVA, nonambulatory  status already on anticoagulation prior to this admission admitted for worsening confusion and staring spells. She was admitted for further evaluation and treatment.   CONSULTATIONS:   neurology  PERTINENT RADIOLOGIC STUDIES: Ct Head Wo Contrast  08/15/2014   CLINICAL DATA:  Prior stroke with unknown visual deficit, Pt son reports that at [place] pt had a "blank stare" and "seemed different" No known hx of dementia.  EXAM: CT HEAD WITHOUT CONTRAST  TECHNIQUE: Contiguous axial  images were obtained from the base of the skull through the vertex without intravenous contrast.  COMPARISON:  MRI brain 07/04/2014  FINDINGS: There is no evidence of mass effect, midline shift, or extra-axial fluid collections. There is no evidence of a space-occupying lesion or intracranial hemorrhage. There is no evidence of a cortical-based area of acute infarction. There is an old right parietal lobe infarct with encephalomalacia. There is generalized cerebral atrophy. There is periventricular white matter low attenuation likely secondary to microangiopathy.  The ventricles and sulci are appropriate for the patient's age. The basal cisterns are patent.  Visualized portions of the orbits are unremarkable. The visualized portions of the paranasal sinuses and mastoid air cells are unremarkable. Cerebrovascular atherosclerotic calcifications are noted.  The osseous structures are unremarkable.  IMPRESSION: 1. No acute intracranial pathology. 2. Chronic microvascular disease and cerebral atrophy.   Electronically Signed   By: Kathreen Devoid   On: 08/15/2014 19:21   Mr Brain Wo Contrast  08/16/2014   CLINICAL DATA:  Acute encephalopathy  EXAM: MRI HEAD WITHOUT CONTRAST  TECHNIQUE: Multiplanar, multiecho pulse sequences of the brain and surrounding structures were obtained without intravenous contrast.  COMPARISON:  CT 08/15/2014.  MRI 07/04/2014  FINDINGS: The patient was not able to complete the study. Axial diffusion, T2 , and FLAIR imaging was performed prior to terminating the study.  Recent acute infarct in the right occipital lobe on the prior MRI is resolving on diffusion. There are multiple new areas of acute infarct.  Multiple areas of restricted diffusion are present consistent with acute infarct involving the cerebellum bilaterally, right occipital lobe inferior to the previously identified infarct, right basal ganglia, and small areas of acute infarct in the frontal and parietal cortex bilaterally.  These are present in multiple vascular distributions and are most consistent with emboli.  Advanced atrophy. Chronic infarcts in the occipital lobes bilaterally. Chronic microvascular ischemia in the white matter.  Image quality degraded by motion.  IMPRESSION: Numerous small areas of acute infarction consistent with emboli.  Advanced atrophy.  Chronic ischemic changes.  The patient was not able to complete the study.   Electronically Signed   By: Franchot Gallo M.D.   On: 08/16/2014 10:34   Dg Chest Port 1 View  08/16/2014   CLINICAL DATA:  Shortness of breath  EXAM: PORTABLE CHEST - 1 VIEW  COMPARISON:  08/15/2014  FINDINGS: Chronic cardiomegaly. 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 This represents a large thyroid nodule on chest CT 03/28/2010. There are coarse interstitial opacities which appear similar to previous. No evidence for superimposed pneumonia or edema. No effusion or pneumothorax.  IMPRESSION: 1. Fibrotic lung disease without acute superimposed finding. 2. Chronic right thyroid nodule with tracheal deviation.   Electronically Signed   By: Jorje Guild M.D.   On: 08/16/2014 03:49   Dg Chest Port 1 View  08/15/2014   CLINICAL DATA:  Altered mental status 1 day. Hypertension and atrial fibrillation.  EXAM: PORTABLE CHEST - 1 VIEW  COMPARISON:  07/04/2014 and 04/17/2013  as well as chest CT 04/10/2011  FINDINGS: Patient is rotated to the left. Lungs are hypoinflated with persistent bilateral coarse interstitial changes compatible with known emphysematous disease and fibrosis. There is mild increased density over the right midlung/hilar region which may be partially due to patient's rotation although cannot exclude developing infectious process. No evidence of effusion. Cardiomediastinal silhouette and remainder of the exam is unchanged.  IMPRESSION: Findings compatible patient's known emphysematous disease and fibrosis. There is slight increased density over the  right hilar/ perihilar region which may be due to the degree of rotation although cannot exclude developing infectious process. Consider repeat frontal radiograph with better positioning versus PA and lateral film.   Electronically Signed   By: Marin Olp M.D.   On: 08/15/2014 16:47     PERTINENT LAB RESULTS: CBC:  Recent Labs  08/18/14 0415  WBC 6.8  HGB 9.5*  HCT 31.1*  PLT 127*   CMET CMP     Component Value Date/Time   NA 145 08/18/2014 0415   K 4.4 08/18/2014 0415   CL 106 08/18/2014 0415   CO2 25 08/18/2014 0415   GLUCOSE 139* 08/18/2014 0415   BUN 32* 08/18/2014 0415   CREATININE 1.81* 08/18/2014 0415   CALCIUM 7.7* 08/18/2014 0415   PROT 5.6* 08/15/2014 1632   ALBUMIN 2.2* 08/15/2014 1632   AST 18 08/15/2014 1632   ALT 12 08/15/2014 1632   ALKPHOS 75 08/15/2014 1632   BILITOT 0.5 08/15/2014 1632   GFRNONAA 26* 08/18/2014 0415   GFRAA 30* 08/18/2014 0415    GFR Estimated Creatinine Clearance: 31.7 mL/min (by C-G formula based on Cr of 1.81). No results for input(s): LIPASE, AMYLASE in the last 72 hours. No results for input(s): CKTOTAL, CKMB, CKMBINDEX, TROPONINI in the last 72 hours. Invalid input(s): POCBNP No results for input(s): DDIMER in the last 72 hours. No results for input(s): HGBA1C in the last 72 hours. No results for input(s): CHOL, HDL, LDLCALC, TRIG, CHOLHDL, LDLDIRECT in the last 72 hours. No results for input(s): TSH, T4TOTAL, T3FREE, THYROIDAB in the last 72 hours.  Invalid input(s): FREET3 No results for input(s): VITAMINB12, FOLATE, FERRITIN, TIBC, IRON, RETICCTPCT in the last 72 hours. Coags: No results for input(s): INR in the last 72 hours.  Invalid input(s): PT Microbiology: Recent Results (from the past 240 hour(s))  Culture, blood (routine x 2)     Status: None   Collection Time: 08/15/14  4:35 PM  Result Value Ref Range Status   Specimen Description BLOOD LEFT HAND  Final   Special Requests BOTTLES DRAWN AEROBIC AND  ANAEROBIC 5CC  Final   Culture  Setup Time   Final    08/16/2014 00:54 Performed at [facility]   Final    DIPHTHEROIDS(CORYNEBACTERIUM SPECIES) Note: Standardized susceptibility testing for this organism is not available. Note: Gram Stain Report Called to,Read Back By and Verified With: SHAKENNA BASS ON 08/17/2014 AT 11:14P BY WILEJ Performed at [facility]    Report Status 08/18/2014 FINAL  Final  Culture, blood (routine x 2)     Status: None (Preliminary result)   Collection Time: 08/15/14  4:42 PM  Result Value Ref Range Status   Specimen Description BLOOD RIGHT HAND  Final   Special Requests BOTTLES DRAWN AEROBIC AND ANAEROBIC 5CC  Final   Culture  Setup Time   Final    08/16/2014 00:54 Performed at [facility]   Final  BLOOD CULTURE RECEIVED NO GROWTH TO DATE CULTURE WILL BE HELD FOR 5 DAYS BEFORE ISSUING A FINAL NEGATIVE REPORT Performed at [facility]    Report Status PENDING  Incomplete  Urine culture     Status: None   Collection Time: 08/15/14  5:05 PM  Result Value Ref Range Status   Specimen Description URINE, CATHETERIZED  Final   Special Requests NONE  Final   Culture  Setup Time   Final    08/15/2014 19:30 Performed at [facility] [facility]   Final    >=100,000 COLONIES/ML Performed at [facility]    Culture   Final    Multiple bacterial morphotypes present, none predominant. Suggest appropriate recollection if clinically indicated. Performed at [facility]    Report Status 08/16/2014 FINAL  Final     BRIEF HOSPITAL COURSE:  Acute encephalopathy:secondary to acute CVA and UTI. EEG neg. Suspect back to baseline.  Active Problems:  Acute CVA: embolic given multiple areas involved in MRI Brain. Recent Echo/Carotids done-hence not repeated. Already on Eliquis, suspect getting a TEE would not change this management. On ASA as well-given overall  frailty-suspect does not need both ASA and Eliquis. Will stop ASA.Unfortunately, very frail and with very poor health overall. Is nonambulatory, unable to bilateral lower extremities, has bilateral upper extremity flexion contractures and is just about able to lift hands a few inches. Suspect poor outcome irrespective of whether we change anticoagulation. Spoke with Dr. Leonie Man from neurology, he agrees that changing anticoagulation and subjecting this unfortunate patient to further procedures/investigations would not add any quality/longevity to her life. After speaking with patient's daughter, and explaining poor overall prognosis, we have entered a DO NOT RESUSCITATE order. Family wants to take her back, they're accepting home hospice care. Family is aware of poor overall current prognosis.   Atrial fibrillation: currently appears to be sinus. Continue Eliquis-see above   Acute on CKD (chronic kidney disease), stage LE:[id number] slightly increased compared to usual baseline-likely secondary to diuresis. Monitor lytes periodically at outpatient if possible.   Volume overload: Multifactorial, probably mild acute on chronic diastolic heart failure and from hypoalbuminemia,still appears to have some extra volume although better. Will continue Lasix on discharge.   Acute on Chronic Diastolic CHF:as above.   Minimally Elevated troponin: suspect demand ischemia. Not a candidate for further work up.Treat Medically   Sepsis with Hypotension:Secondary to UTI, BP now stable. Stop Hydrocortisone and IVF.   SWH:QPRFFMBW, looks non toxic. Completed 5 days of IV Cefepime.Urine cs multiple bacterial morphotype's, since empirically treated for 5 days, will  not discharged on any further antibiotics.  ? Gram positive rod bacteremia: Suspect a contamination.Final blood culture results shows diphtheroids. No need for further workup/repeat blood culture.   Diabetes mellitus type 2, controlled:CBG's  controlled with SSI. Resume oral hypoglycemic agents on discharge.   COPD/Interstitial lung disease:continue with chronic prednisone   Thrombocytopenia:mild, improving. Repeat CBC periodicallyif possible in the outpatient setting.    Palliative Care:very poor overall prognoses. Bed bound, poor quality of life. Spoke with Daughter on 11/25-Annie Bianna Haran [phone]-family interested in home hospice care. I spoke to her about DNR, explained that she is not a candidate for aggressive care, Ms Willden agreed with DNR order. Subsequently spoke with Deneise Lever on 11/28, she is aware that patient is being discharged today with home hospice and is aware of very poor overall prognosis.    TODAY-DAY OF DISCHARGE:  Subjective:   Lovett Sox  today remains stable.  Objective:   Blood pressure 108/74, pulse 102, temperature 97.3 F (36.3 C), temperature source Oral, resp. rate 18, height 5\' 6"  (1.676 m), weight 106.652 kg (235 lb 2 oz), SpO2 100 %.  Intake/Output Summary (Last 24 hours) at 08/19/14 1157 Last data filed at 08/19/14 1010  Gross per 24 hour  Intake    240 ml  Output   1450 ml  Net  -1210 ml   Filed Weights   08/17/14 0743 08/18/14 0456 08/19/14 0650  Weight: 102.541 kg (226 lb 1 oz) 103.329 kg (227 lb 12.8 oz) 106.652 kg (235 lb 2 oz)    Exam Awake Alert, speech and cognition remain at baseline. Unchanged Neuro exam [place].AT,PERRAL Supple Neck,No JVD, No cervical lymphadenopathy appriciated.  Symmetrical Chest wall movement, Good air movement bilaterally, CTAB RRR,No Gallops,Rubs or new Murmurs, No Parasternal Heave +ve B.Sounds, Abd Soft, Non tender, No organomegaly appriciated, No rebound -guarding or rigidity. No Cyanosis, Clubbing or edema, No new Rash or bruise  DISCHARGE CONDITION: Stable  DISPOSITION: Home with Hospice   DISCHARGE INSTRUCTIONS:    Activity:  As tolerated with Full fall precautions use walker/cane & assistance as needed  Diet  recommendation: Diabetic Diet Heart Healthy diet  Discharge Instructions    Ambulatory referral to Neurology    Complete by:  As directed   Dr. Erlinda Hong requests followup in 2 months - Patient was previously admitted October 2015 with follow-up with Dr. Erlinda Hong. If already scheduled, continue with that appointment else make her a new one.     Diet - low sodium heart healthy    Complete by:  As directed      Increase activity slowly    Complete by:  As directed            Follow-up Information    Follow up with Xu,Jindong, MD In 1 month.   Specialty:  Neurology   Why:  [facility]roke [facility], Office will call you with appointment date & time   Contact information:   [address] [place] [address] [phone]       Follow up with Cathlean Cower, MD.   Specialties:  Internal Medicine, Radiology   Why:  As needed   Contact information:   [address] [place]  [address] [phone]         Total Time spent on discharge equals 45 minutes.  SignedOren Binet 08/19/2014 11:57 AM

## 2014-08-19 NOTE — Progress Notes (Signed)
Pt is being discharge home. Transportation was provided and discharge package was given to transporter

## 2014-08-19 NOTE — Clinical Social Work Note (Signed)
Patient requires ambulance transport home. CSW contacted weekend RNCM to confirm that hospice services are in place for patient at home. RNCM will contact CSW with confirmation of this. CSW will then arranged ambulance transport. CSW has confirmed address with patient's sister Yoali Conry.  Liz Beach MSW, [place], [place], [phone]

## 2014-08-19 NOTE — Progress Notes (Signed)
OT Cancellation Note and Discharge  Patient Details Name: [name] MRN: [id number] DOB: 1933-12-13   Cancelled Treatment:    Reason Eval/Treat Not Completed: Other (comment). This pt is now D/C'ing with hospice  Services and was total A pta--no acute OT needs identified, we will sign off.  Almon Register [phone] 08/19/2014, 7:15 AM

## 2014-08-22 ENCOUNTER — Telehealth: Payer: Self-pay | Admitting: *Deleted

## 2014-08-22 LAB — CULTURE, BLOOD (ROUTINE X 2): Culture: NO GROWTH

## 2014-08-22 NOTE — Telephone Encounter (Signed)
[facility] Triage Call Report Triage Record Num: [id number] Operator: Arther Abbott Patient Name: [name] Call Date & Time: 08/19/2014 9:28:59PM Patient Phone: [phone] PCP: Cathlean Cower Patient Gender: Female PCP Fax : [phone] Patient DOB: 1934-04-11 Practice Name: [facility] Reason for Call: Caller: Annie/Other; PCP: Cathlean Cower (Adults only); CB#: [phone]; Call regarding Pain medication; Daughter states patient discharged home today 08/19/2014, "they say her organs are shutting down and there is nothing else they can do and Hospice is coming out in the morning." DAughter states was given script for Morphine, but has not been able to get it filled yet and patient in pain. Has Tylenol 3 with codeine on hand andwondering if okay to give this to the patient? Advised if prescribed for patient to give as directed, daughter voiced understanding. Protocol(s) Used: Medication Questions - Adult Recommended Outcome per Protocol: Provided Health Information Reason for Outcome: Caller has medication question(s) that was answered with available resources Care Advice: ~ 11/

## 2014-09-19 ENCOUNTER — Telehealth: Payer: Self-pay

## 2014-09-19 NOTE — Telephone Encounter (Signed)
Rec'd death certificate for Dr. Jenny Reichmann to sign 09/18/14.  Called [facility] to pick up 09/19/14

## 2014-09-22 DEATH — deceased

## 2015-08-12 IMAGING — CR DG CHEST 1V PORT
1 series · 1 of 1 positions shown · non-contrast
Comparison: 08/15/2014

CLINICAL DATA: Shortness of breath

EXAM:
PORTABLE CHEST - 1 VIEW

[AP]
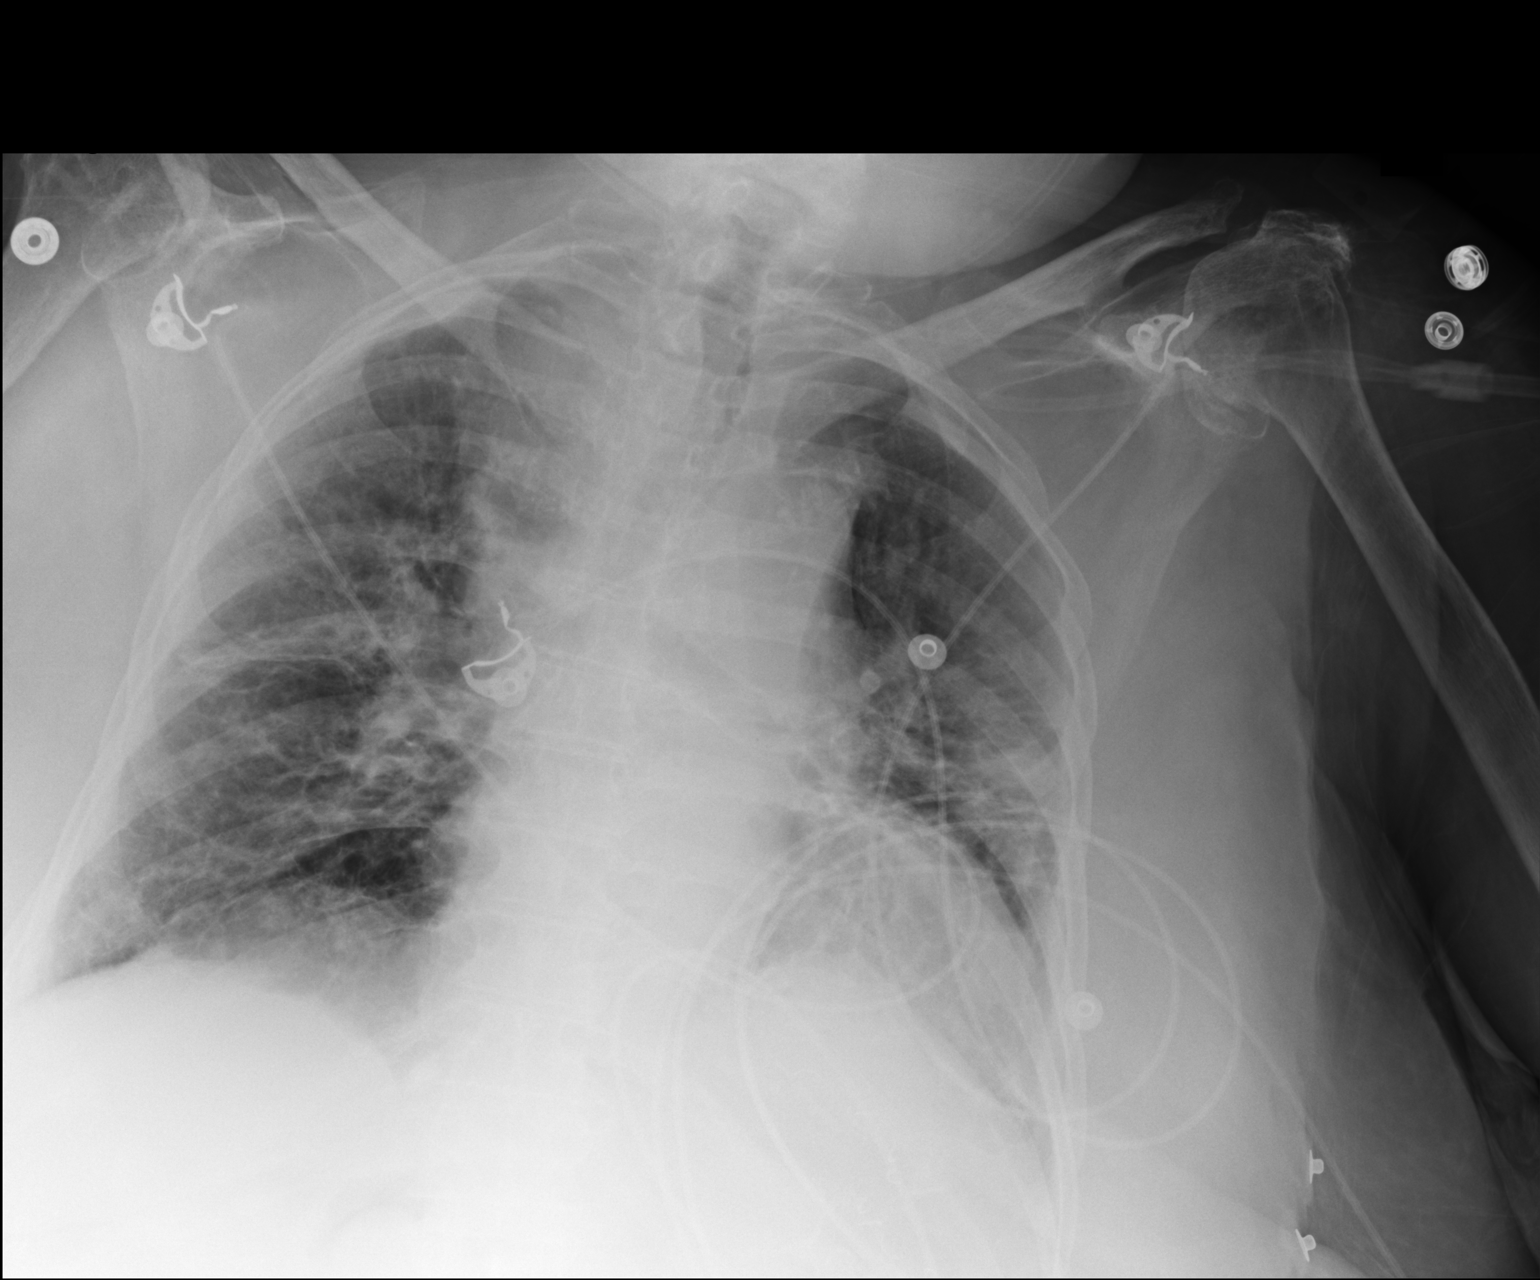

[1 of 1 positions shown; findings below may reference images not displayed]

FINDINGS: Chronic cardiomegaly. Chronic superior mediastinal mass with
leftward tracheal deviation. This represents a large thyroid nodule
on chest CT 03/28/2010. There are coarse interstitial opacities
which appear similar to previous. No evidence for superimposed
pneumonia or edema. No effusion or pneumothorax.
IMPRESSION: 1. Fibrotic lung disease without acute superimposed finding.
2. Chronic right thyroid nodule with tracheal deviation.
